# Patient Record
Sex: Female | Born: 1980 | Race: White | Hispanic: No | Marital: Single | Smoking: Never smoker
Health system: Southern US, Community
[De-identification: ages and names within clinical notes are randomized; demographics above are authoritative.]

---

## 2015-09-22 DIAGNOSIS — K219 Gastro-esophageal reflux disease without esophagitis: Secondary | ICD-10-CM

## 2015-09-22 HISTORY — DX: Gastro-esophageal reflux disease without esophagitis: K21.9

## 2018-07-31 ENCOUNTER — Emergency Department (HOSPITAL_COMMUNITY): Payer: Medicare Other

## 2018-07-31 ENCOUNTER — Emergency Department (HOSPITAL_COMMUNITY)
Admission: EM | Admit: 2018-07-31 | Discharge: 2018-07-31 | Disposition: A | Discharge status: Home / Self Care | Payer: Medicare Other | Attending: Emergency Medicine | Admitting: Emergency Medicine

## 2018-07-31 ENCOUNTER — Other Ambulatory Visit: Payer: Self-pay

## 2018-07-31 DIAGNOSIS — R0789 Other chest pain: Secondary | ICD-10-CM | POA: Insufficient documentation

## 2018-07-31 DIAGNOSIS — R1011 Right upper quadrant pain: Secondary | ICD-10-CM | POA: Insufficient documentation

## 2018-07-31 LAB — CBC WITH DIFFERENTIAL/PLATELET
ABS IMMATURE GRANULOCYTES: 0.05 10*3/uL (ref 0.00–0.07)
Basophils Absolute: 0 10*3/uL (ref 0.0–0.1)
Basophils Relative: 0 %
Eosinophils Absolute: 0.1 10*3/uL (ref 0.0–0.5)
Eosinophils Relative: 1 %
HCT: 39.2 % (ref 36.0–46.0)
HEMOGLOBIN: 12.5 g/dL (ref 12.0–15.0)
IMMATURE GRANULOCYTES: 0 %
LYMPHS PCT: 26 %
Lymphs Abs: 2.9 10*3/uL (ref 0.7–4.0)
MCH: 28 pg (ref 26.0–34.0)
MCHC: 31.9 g/dL (ref 30.0–36.0)
MCV: 87.7 fL (ref 80.0–100.0)
MONO ABS: 0.5 10*3/uL (ref 0.1–1.0)
Monocytes Relative: 4 %
NEUTROS ABS: 7.7 10*3/uL (ref 1.7–7.7)
NEUTROS PCT: 69 %
Platelets: 311 10*3/uL (ref 150–400)
RBC: 4.47 MIL/uL (ref 3.87–5.11)
RDW: 12.1 % (ref 11.5–15.5)
WBC: 11.3 10*3/uL — ABNORMAL HIGH (ref 4.0–10.5)
nRBC: 0 % (ref 0.0–0.2)

## 2018-07-31 LAB — I-STAT TROPONIN, ED
Troponin i, poc: 0.01 ng/mL (ref 0.00–0.08)
Troponin i, poc: 0.01 ng/mL (ref 0.00–0.08)

## 2018-07-31 LAB — BASIC METABOLIC PANEL
Anion gap: 11 (ref 5–15)
BUN: 6 mg/dL (ref 6–20)
CHLORIDE: 99 mmol/L (ref 98–111)
CO2: 24 mmol/L (ref 22–32)
Calcium: 8.9 mg/dL (ref 8.9–10.3)
Creatinine, Ser: 0.73 mg/dL (ref 0.44–1.00)
GFR calc Af Amer: >60 mL/min (ref >60)
GFR calc non Af Amer: >60 mL/min (ref >60)
GLUCOSE: 107 mg/dL — AB (ref 70–99)
POTASSIUM: 3.2 mmol/L — AB (ref 3.5–5.1)
Sodium: 134 mmol/L — ABNORMAL LOW (ref 135–145)

## 2018-07-31 LAB — LIPASE, BLOOD: LIPASE: 31 U/L (ref 11–51)

## 2018-07-31 LAB — HEPATIC FUNCTION PANEL
ALK PHOS: 72 U/L (ref 38–126)
ALT: 18 U/L (ref 0–44)
AST: 19 U/L (ref 15–41)
Albumin: 3.6 g/dL (ref 3.5–5.0)
BILIRUBIN DIRECT: 0.2 mg/dL (ref 0.0–0.2)
Indirect Bilirubin: 0.6 mg/dL (ref 0.3–0.9)
TOTAL PROTEIN: 7.1 g/dL (ref 6.5–8.1)
Total Bilirubin: 0.8 mg/dL (ref 0.3–1.2)

## 2018-07-31 LAB — I-STAT BETA HCG BLOOD, ED (MC, WL, AP ONLY)

## 2018-07-31 LAB — D-DIMER, QUANTITATIVE (NOT AT ARMC)

## 2018-07-31 MED ORDER — MORPHINE SULFATE (PF) 4 MG/ML IV SOLN
4.0000 mg | Freq: Once | INTRAVENOUS | Status: AC
  Administered 2018-07-31: 4 mg via INTRAVENOUS
  Filled 2018-07-31: qty 1

## 2018-07-31 MED ORDER — SODIUM CHLORIDE 0.9 % IV BOLUS
1000.0000 mL | Freq: Once | INTRAVENOUS | Status: AC
  Administered 2018-07-31: 1000 mL via INTRAVENOUS

## 2018-07-31 MED ORDER — KETOROLAC TROMETHAMINE 30 MG/ML IJ SOLN
30.0000 mg | Freq: Once | INTRAMUSCULAR | Status: AC
  Administered 2018-07-31: 30 mg via INTRAVENOUS
  Filled 2018-07-31: qty 1

## 2018-07-31 MED ORDER — ONDANSETRON HCL 4 MG/2ML IJ SOLN
4.0000 mg | Freq: Once | INTRAMUSCULAR | Status: AC
  Administered 2018-07-31: 4 mg via INTRAVENOUS
  Filled 2018-07-31: qty 2

## 2018-07-31 NOTE — ED Notes (Signed)
Patient transported to Ultrasound 

## 2018-07-31 NOTE — ED Notes (Signed)
Lab states they will add on d-dimer to previous blood draw.

## 2018-07-31 NOTE — Discharge Instructions (Signed)
Stay hydrated.   Take tylenol for pain.   See wellness center for follow up   If you have no place to stay, see list of shelters  Return to ER if you have worse chest pain, trouble breathing, vomiting.

## 2018-07-31 NOTE — ED Notes (Signed)
Per main lab- able to add on hepatic function panel and lipase to previous bloodwork

## 2018-07-31 NOTE — ED Triage Notes (Signed)
Pt to ED via EMS c/o chest pain that started this morning. Pain is across both sides of chest with radiation to right arm. Pain is worse with palpation and inspiration. 324 ASA and 2 nitro given PTA with no improvement. No previous medical hx.

## 2018-07-31 NOTE — ED Provider Notes (Signed)
[facility] EMERGENCY DEPARTMENT Provider Note   CSN: [id number] Arrival date & time: 07/31/18  1029     History   Chief Complaint Chief Complaint  Patient presents with  . Chest Pain    HPI Felicia Sutton is a 37 y.o. female.  Patient is a 37 year old female with no significant past medical history who presents with chest pain.  She states she was walking to [employer] and developed pain across her chest.  She describes it as a tightness and heaviness.  She has some associated shortness of breath.  She had some nausea but no vomiting.  No fevers.  No cough or chest congestion.  No leg pain or swelling.  No diaphoresis.  No history of heart problems in the past.  No history of similar type pain in the past.  She was given aspirin and 2 nitroglycerin by EMS with no improvement in symptoms.     No past medical history on file.  There are no active problems to display for this patient.    The histories are not reviewed yet. Please review them in the "History" navigator section and refresh this SmartLink.   OB History   None      Home Medications    Prior to Admission medications   Not on File    Family History No family history on file.  Social History Social History   Tobacco Use  . Smoking status: Not on file  Substance Use Topics  . Alcohol use: Not on file  . Drug use: Not on file     Allergies   Patient has no known allergies.   Review of Systems Review of Systems  Constitutional: Positive for fatigue. Negative for chills, diaphoresis and fever.  HENT: Negative for congestion, rhinorrhea and sneezing.   Eyes: Negative.   Respiratory: Positive for chest tightness and shortness of breath. Negative for cough.   Cardiovascular: Negative for chest pain and leg swelling.  Gastrointestinal: Positive for nausea. Negative for abdominal pain, blood in stool, diarrhea and vomiting.  Genitourinary: Negative for difficulty urinating, flank  pain, frequency and hematuria.  Musculoskeletal: Negative for arthralgias and back pain.  Skin: Negative for rash.  Neurological: Negative for dizziness, speech difficulty, weakness, numbness and headaches.     Physical Exam Updated Vital Signs BP 114/81   Pulse 71   Temp 97.8 F (36.6 C) (Oral)   Resp 14   Ht 5\' 2"  (1.575 m)   Wt 99.8 kg   LMP 07/22/2018 (Exact Date)   SpO2 100%   BMI 40.24 kg/m   Physical Exam  Constitutional: She is oriented to person, place, and time. She appears well-developed and well-nourished.  HENT:  Head: Normocephalic and atraumatic.  Eyes: Pupils are equal, round, and reactive to light.  Neck: Normal range of motion. Neck supple.  Cardiovascular: Normal rate, regular rhythm and normal heart sounds.  Pulmonary/Chest: Effort normal and breath sounds normal. No respiratory distress. She has no wheezes. She has no rales. She exhibits tenderness.  Abdominal: Soft. Bowel sounds are normal. There is no tenderness. There is no rebound and no guarding.  Musculoskeletal: Normal range of motion. She exhibits no edema.  No edema or calf tenderness  Lymphadenopathy:    She has no cervical adenopathy.  Neurological: She is alert and oriented to person, place, and time.  Skin: Skin is warm and dry. No rash noted.  Psychiatric: She has a normal mood and affect.     ED Treatments / Results  Labs (all labs ordered are listed, but only abnormal results are displayed) Labs Reviewed  BASIC METABOLIC PANEL - Abnormal; Notable for the following components:      Result Value   Sodium 134 (*)    Potassium 3.2 (*)    Glucose, Bld 107 (*)    All other components within normal limits  CBC WITH DIFFERENTIAL/PLATELET - Abnormal; Notable for the following components:   WBC 11.3 (*)    All other components within normal limits  D-DIMER, QUANTITATIVE (NOT AT [facility])  HEPATIC FUNCTION PANEL  LIPASE, BLOOD  I-STAT TROPONIN, ED  I-STAT BETA HCG BLOOD, ED ([facility], [facility], [facility]  ONLY)  I-STAT TROPONIN, ED    EKG EKG Interpretation  Date/Time:  Sunday July 31 2018 11:52:12 EST Ventricular Rate:  85 PR Interval:    QRS Duration: 70 QT Interval:  368 QTC Calculation: 438 R Axis:   40 Text Interpretation:  Sinus rhythm since last tracing no significant change Confirmed by Rolan Bucco [phone]) on 07/31/2018 2:01:17 PM   Radiology Dg Chest 2 View  Result Date: 07/31/2018 CLINICAL DATA:  Chest pain EXAM: CHEST - 2 VIEW COMPARISON:  None. FINDINGS: Monitoring leads overlie the patient. Normal cardiac and mediastinal contours. No consolidative pulmonary opacities. No pleural effusion or pneumothorax. IMPRESSION: No acute cardiopulmonary process. Electronically Signed   By: Annia Belt M.D.   On: 07/31/2018 11:46    Procedures Procedures (including critical care time)  Medications Ordered in ED Medications  morphine 4 MG/ML injection 4 mg (4 mg Intravenous Given 07/31/18 1115)  ketorolac (TORADOL) 30 MG/ML injection 30 mg (30 mg Intravenous Given 07/31/18 1414)     Initial Impression / Assessment and Plan / ED Course  I have reviewed the triage vital signs and the nursing notes.  Pertinent labs & imaging results that were available during my care of the patient were reviewed by me and considered in my medical decision making (see chart for details).     Pt is a 37yo who presents with chest pain while walking to [employer].  It is reproducible on palpation.  Her EKG does not show any ischemic changes.  She has had 2- troponins.  On reexam she had some vomiting.  Now she seems to have some pain in her right upper quadrant.  Further blood work and ultrasound are pending.  Patient care to be taken over by Dr. Silverio Sutton pending these results.  Final Clinical Impressions(s) / ED Diagnoses   Final diagnoses:  RUQ pain  Atypical chest pain    ED Discharge Orders    None       Rolan Bucco, MD 07/31/18 1553

## 2018-07-31 NOTE — ED Provider Notes (Signed)
  Physical Exam  BP 114/81   Pulse 71   Temp 97.8 F (36.6 C) (Oral)   Resp 14   Ht 5\' 2"  (1.575 m)   Wt 99.8 kg   LMP 07/22/2018 (Exact Date)   SpO2 100%   BMI 40.24 kg/m   Physical Exam  ED Course/Procedures     Procedures  MDM  Care assumed at 4 pm. Patient here with chest pain. Trop x 2 neg, D-dimer normal. Upon discharge, developed vomiting and RUQ pain. Sign out pending RUQ [place] and LFTs.   6:24 PM LFTs nl, RUQ nl. No vomiting after zofran and IVF. Stable for discharge. Patient states that she has nobody at home. Told her that she can call a ride and gave a list of shelters.         Charlynne Pander, MD 07/31/18 [phone]

## 2018-07-31 NOTE — ED Notes (Signed)
Patient transported to X-ray 

## 2018-08-27 ENCOUNTER — Emergency Department (HOSPITAL_COMMUNITY)
Admission: EM | Admit: 2018-08-27 | Discharge: 2018-08-27 | Disposition: A | Discharge status: Home / Self Care | Payer: Medicare Other | Attending: Emergency Medicine | Admitting: Emergency Medicine

## 2018-08-27 ENCOUNTER — Emergency Department (HOSPITAL_COMMUNITY): Payer: Medicare Other

## 2018-08-27 ENCOUNTER — Other Ambulatory Visit: Payer: Self-pay

## 2018-08-27 DIAGNOSIS — N10 Acute tubulo-interstitial nephritis: Secondary | ICD-10-CM | POA: Diagnosis not present

## 2018-08-27 DIAGNOSIS — R81 Glycosuria: Secondary | ICD-10-CM

## 2018-08-27 DIAGNOSIS — N12 Tubulo-interstitial nephritis, not specified as acute or chronic: Secondary | ICD-10-CM

## 2018-08-27 DIAGNOSIS — R109 Unspecified abdominal pain: Secondary | ICD-10-CM | POA: Diagnosis present

## 2018-08-27 LAB — CBC WITH DIFFERENTIAL/PLATELET
ABS IMMATURE GRANULOCYTES: 0.05 10*3/uL (ref 0.00–0.07)
Basophils Absolute: 0 10*3/uL (ref 0.0–0.1)
Basophils Relative: 0 %
EOS ABS: 0.1 10*3/uL (ref 0.0–0.5)
EOS PCT: 1 %
HCT: 39.7 % (ref 36.0–46.0)
HEMOGLOBIN: 12.1 g/dL (ref 12.0–15.0)
Immature Granulocytes: 1 %
LYMPHS ABS: 2.5 10*3/uL (ref 0.7–4.0)
LYMPHS PCT: 25 %
MCH: 27.3 pg (ref 26.0–34.0)
MCHC: 30.5 g/dL (ref 30.0–36.0)
MCV: 89.6 fL (ref 80.0–100.0)
MONO ABS: 0.7 10*3/uL (ref 0.1–1.0)
Monocytes Relative: 7 %
Neutro Abs: 6.9 10*3/uL (ref 1.7–7.7)
Neutrophils Relative %: 66 %
Platelets: 325 10*3/uL (ref 150–400)
RBC: 4.43 MIL/uL (ref 3.87–5.11)
RDW: 13.5 % (ref 11.5–15.5)
WBC: 10.2 10*3/uL (ref 4.0–10.5)
nRBC: 0 % (ref 0.0–0.2)

## 2018-08-27 LAB — WET PREP, GENITAL
Clue Cells Wet Prep HPF POC: NONE SEEN
SPERM: NONE SEEN
Trich, Wet Prep: NONE SEEN
Yeast Wet Prep HPF POC: NONE SEEN

## 2018-08-27 LAB — COMPREHENSIVE METABOLIC PANEL
ALK PHOS: 81 U/L (ref 38–126)
ALT: 17 U/L (ref 0–44)
AST: 14 U/L — ABNORMAL LOW (ref 15–41)
Albumin: 3.6 g/dL (ref 3.5–5.0)
Anion gap: 12 (ref 5–15)
BUN: 5 mg/dL — ABNORMAL LOW (ref 6–20)
CALCIUM: 8.8 mg/dL — AB (ref 8.9–10.3)
CO2: 24 mmol/L (ref 22–32)
CREATININE: 0.61 mg/dL (ref 0.44–1.00)
Chloride: 102 mmol/L (ref 98–111)
GFR calc non Af Amer: >60 mL/min (ref >60)
Glucose, Bld: 100 mg/dL — ABNORMAL HIGH (ref 70–99)
Potassium: 3.5 mmol/L (ref 3.5–5.1)
SODIUM: 138 mmol/L (ref 135–145)
Total Bilirubin: 0.8 mg/dL (ref 0.3–1.2)
Total Protein: 7.3 g/dL (ref 6.5–8.1)

## 2018-08-27 LAB — URINALYSIS, ROUTINE W REFLEX MICROSCOPIC
BILIRUBIN URINE: NEGATIVE
Glucose, UA: >=500 mg/dL — AB
KETONES UR: NEGATIVE mg/dL
Leukocytes, UA: NEGATIVE
NITRITE: NEGATIVE
Protein, ur: NEGATIVE mg/dL
RBC / HPF: >50 RBC/hpf — ABNORMAL HIGH (ref 0–5)
Specific Gravity, Urine: 1.002 — ABNORMAL LOW (ref 1.005–1.030)
WBC, UA: >50 WBC/hpf — ABNORMAL HIGH (ref 0–5)
pH: 5 (ref 5.0–8.0)

## 2018-08-27 LAB — I-STAT BETA HCG BLOOD, ED (MC, WL, AP ONLY)

## 2018-08-27 MED ORDER — SODIUM CHLORIDE 0.9 % IV SOLN
1.0000 g | Freq: Once | INTRAVENOUS | Status: AC
  Administered 2018-08-27: 1 g via INTRAVENOUS
  Filled 2018-08-27: qty 10

## 2018-08-27 MED ORDER — CEPHALEXIN 500 MG PO CAPS: 500.0000 mg | ORAL_CAPSULE | Freq: Four times a day (QID) | ORAL | 0 refills | Status: AC

## 2018-08-27 MED ORDER — ONDANSETRON HCL 4 MG/2ML IJ SOLN
4.0000 mg | Freq: Once | INTRAMUSCULAR | Status: AC
  Administered 2018-08-27: 4 mg via INTRAVENOUS
  Filled 2018-08-27: qty 2

## 2018-08-27 MED ORDER — SODIUM CHLORIDE 0.9 % IV BOLUS
1000.0000 mL | Freq: Once | INTRAVENOUS | Status: AC
  Administered 2018-08-27: 1000 mL via INTRAVENOUS

## 2018-08-27 MED ORDER — ONDANSETRON HCL 4 MG PO TABS: 4.0000 mg | ORAL_TABLET | Freq: Three times a day (TID) | ORAL | 0 refills | Status: DC | PRN

## 2018-08-27 MED ORDER — MORPHINE SULFATE (PF) 4 MG/ML IV SOLN
4.0000 mg | Freq: Once | INTRAVENOUS | Status: AC
  Administered 2018-08-27: 4 mg via INTRAVENOUS
  Filled 2018-08-27: qty 1

## 2018-08-27 NOTE — ED Provider Notes (Signed)
[facility] EMERGENCY DEPARTMENT Provider Note   CSN: [id number] Arrival date & time: 08/27/18  1135     History   Chief Complaint Chief Complaint  Patient presents with  . Flank Pain    HPI Felicia MansJennifer Sutton is a 37 y.o. female.  HPI  Patient is a 37 year old female with no significant past medical history presenting for right flank pain.  Patient reports that her symptoms began suddenly this morning.  She describes the pain as sharp, constant, and nonradiating.  She denies any lower abdominal pain, dysuria, urgency, frequency, vaginal bleeding, vaginal discharge.  She does report she had one episode of nausea with vomiting earlier today to the pain.  Last bowel movement this morning, and normal without melena or hematochezia.  No diarrhea.  Patient denies any fever or chills.  LMP 3 weeks ago.   No past medical history on file.  There are no active problems to display for this patient.   OB History   None      Home Medications    Prior to Admission medications   Not on File    Family History No family history on file.  Social History Social History   Tobacco Use  . Smoking status: Not on file  Substance Use Topics  . Alcohol use: Not on file  . Drug use: Not on file     Allergies   Patient has no known allergies.   Review of Systems Review of Systems  Constitutional: Negative for chills and fever.  Gastrointestinal: Positive for nausea and vomiting. Negative for abdominal pain.  Genitourinary: Positive for flank pain. Negative for dysuria, frequency, urgency, vaginal bleeding, vaginal discharge and vaginal pain.  All other systems reviewed and are negative.    Physical Exam Updated Vital Signs BP (!) 113/92   Pulse 71   Temp 98.3 F (36.8 C) (Oral)   Ht 5\' 2"  (1.575 m)   Wt 99.3 kg   SpO2 100%   BMI 40.06 kg/m   Physical Exam  Constitutional: She appears well-developed and well-nourished. No distress.  HENT:  Head:  Normocephalic and atraumatic.  Mouth/Throat: Oropharynx is clear and moist.  Eyes: Pupils are equal, round, and reactive to light. Conjunctivae and EOM are normal.  Neck: Normal range of motion. Neck supple.  Cardiovascular: Normal rate, regular rhythm, S1 normal and S2 normal.  No murmur heard. Pulmonary/Chest: Effort normal and breath sounds normal. She has no wheezes. She has no rales.  Abdominal: Soft. She exhibits no distension. There is no tenderness. There is no guarding.  +Right CVA TTP.  No RLQ/McBurney's point TTP.   Genitourinary:  Genitourinary Comments: Pelvic examination performed with RN chaperone present.  No lymphadenopathy or lesions of the perineum.  Vaginal tissue pink and rugated.  Cervix erythematous around os.  Small amount of yellow discharge surrounding os.  On bimanual exam, patient exhibits no cervical motion tenderness, uterine fundus tenderness, nor adnexal tenderness.  Musculoskeletal: Normal range of motion. She exhibits no edema or deformity.  Neurological: She is alert.  Cranial nerves grossly intact. Patient moves extremities symmetrically and with good coordination.  Skin: Skin is warm and dry. No rash noted. No erythema.  Psychiatric: She has a normal mood and affect. Her behavior is normal. Judgment and thought content normal.  Nursing note and vitals reviewed.    ED Treatments / Results  Labs (all labs ordered are listed, but only abnormal results are displayed) Labs Reviewed  WET PREP, GENITAL - Abnormal; Notable for  the following components:      Result Value   WBC, Wet Prep HPF POC MANY (*)    All other components within normal limits  COMPREHENSIVE METABOLIC PANEL - Abnormal; Notable for the following components:   Glucose, Bld 100 (*)    BUN 5 (*)    Calcium 8.8 (*)    AST 14 (*)    All other components within normal limits  URINALYSIS, ROUTINE W REFLEX MICROSCOPIC - Abnormal; Notable for the following components:   Color, Urine  COLORLESS (*)    Specific Gravity, Urine 1.002 (*)    Glucose, UA >=500 (*)    Hgb urine dipstick SMALL (*)    RBC / HPF >50 (*)    WBC, UA >50 (*)    Bacteria, UA MANY (*)    All other components within normal limits  URINE CULTURE  CBC WITH DIFFERENTIAL/PLATELET  RPR  HIV ANTIBODY (ROUTINE TESTING W REFLEX)  I-STAT BETA HCG BLOOD, ED ([facility], [facility], [facility] ONLY)  GC/CHLAMYDIA PROBE AMP ([place]) NOT AT [facility]    EKG None  Radiology Ct Renal Stone Study  Result Date: 08/27/2018 CLINICAL DATA:  Right flank pain that began this morning EXAM: CT ABDOMEN AND PELVIS WITHOUT CONTRAST TECHNIQUE: Multidetector CT imaging of the abdomen and pelvis was performed following the standard protocol without IV contrast. COMPARISON:  None. FINDINGS: Lower chest: No acute abnormality. Hepatobiliary: No focal liver abnormality is seen. No gallstones, gallbladder wall thickening, or biliary dilatation. Pancreas: Unremarkable. No pancreatic ductal dilatation or surrounding inflammatory changes. Spleen: Normal in size without focal abnormality. Adrenals/Urinary Tract: Normal adrenal glands. Punctate 2 mm nonobstructing left renal calculus. Punctate nonobstructing left inferior pole renal calculus. No other urolithiasis. No obstructive uropathy. Normal bladder. Stomach/Bowel: Stomach is within normal limits. Diverticulosis without evidence of diverticulitis. No evidence of bowel wall thickening, distention, or inflammatory changes. Vascular/Lymphatic: No significant vascular findings are present. No enlarged abdominal or pelvic lymph nodes. Reproductive: Uterus and bilateral adnexa are unremarkable. Other: No abdominal wall hernia or abnormality. No abdominopelvic ascites. Musculoskeletal: No acute osseous abnormality. No aggressive osseous lesion. IMPRESSION: 1. Punctate 2 mm nonobstructing left renal calculus. No obstructive uropathy. Electronically Signed   By: Elige Ko   On: 08/27/2018 13:57     Procedures Procedures (including critical care time)  Medications Ordered in ED Medications  sodium chloride 0.9 % bolus 1,000 mL (1,000 mLs Intravenous New Bag/Given 08/27/18 1243)  morphine 4 MG/ML injection 4 mg (4 mg Intravenous Given 08/27/18 1301)     Initial Impression / Assessment and Plan / ED Course  I have reviewed the triage vital signs and the nursing notes.  Pertinent labs & imaging results that were available during my care of the patient were reviewed by me and considered in my medical decision making (see chart for details).  Clinical Course as of Aug 27 1548  Sat Aug 27, 2018  1320 Pt denies history of diabetes.   Glucose, UA(!): >=500 [AM]  1353 Reassessed.  Patient reporting some nausea.  Will give antiemetics.  Rocephin ordered for infected stone versus pyelonephritis.   [AM]  1542 Reassessed. In NAD.    [AM]  1549 Spoke with Dr. Allena Katz of [facility] radiology who states that subtle findings of pyelonephritis are often not visible on noncontrast CT scan.  He does not see any signs of obstruction on the CT scan.  Urinalysis and symptoms are clinically consistent with pyelo and will treat patient as such.   [AM]  Clinical Course User Index [AM] Elisha Ponder, PA-C    Patient nontoxic-appearing, afebrile, and in no acute distress.  Of diagnosis includes pyelonephritis, nephrolithiasis, PID, appendicitis, cholecystitis, cholelithiasis.  CT renal stone study demonstrated nonobstructive left renal stones, but no active nephrolithiasis.  No renal obstruction.  No other acute findings.  No adnexal mass.  Urinalysis significant for many WBCs, many RBCs, many bacteria.  No leukocyte esterase or nitrites.  Culture is pending.  Patient is glucosuria, no history of diabetes.  We discussed this.  Patient has normal blood glucose.  Resources provided to help establish care with primary care.  No leukocytosis.  Doubt PID, as patient had no tenderness on pelvic  examination.  Suspect the patient had right flank pain secondary to syndrome, patient would have more severe pelvic pain.  Patient did have some discharge with many WBCs, however is not complaining of increased discharge.  Do not feel that patient needs empiric treatment for gonorrhea and chlamydia. Feel that if patient has gonorrhea or chlamydia, she can be treated for cervicitis.  Clinical picture is not consistent with PID.   We will treat patient with Keflex.  Discussed with patient that should her culture come back resistant to Keflex, she will be called to change antibiotics.  Also discussed with patient that should she receive a positive result for any of her STI testing today, she will receive a call.  Recommend Tylenol for pain, Zofran for nausea.  Patient given resources to establish primary care in this community.  Return precautions given for any worsening pain, intractable nausea or vomiting, or fevers.  Patient is in understanding and agrees with the plan of care.  This is a supervised visit with Dr. Thayer Ohm Tegeler. Evaluation, management, and discharge planning discussed with this attending physician.  Final Clinical Impressions(s) / ED Diagnoses   Final diagnoses:  Pyelonephritis  Glucosuria    ED Discharge Orders         Ordered    cephALEXin (KEFLEX) 500 MG capsule  4 times daily     08/27/18 1556    ondansetron (ZOFRAN) 4 MG tablet  Every 8 hours PRN     08/27/18 1556           Elisha Ponder, PA-C 08/27/18 1603    Tegeler, Canary Brim, MD 08/27/18 1626

## 2018-08-27 NOTE — ED Triage Notes (Signed)
Pt arrived via POV d/t R.lower flank pain that started approx 2 days ago w/ N/V

## 2018-08-27 NOTE — Discharge Instructions (Signed)
Please see the information and instructions below regarding your visit.  Your diagnoses today include:  1. Pyelonephritis   2. Glucosuria    Your symptoms are caused by a urinary tract infection, which occurs when bacteria travel up into the bladder and then into the kidney. This is a very common condition. Often, bacteria is transmitted while wiping after using the restroom. It is reassuring that you do not have a fever today.  Tests performed today include: See side panel of your discharge paperwork for testing performed today. Vital signs are listed at the bottom of these instructions.  -Urine tests. Suggestive of infection.  The fluid in the vagina also has a lot of infection fighting cells.  You were tested for gonorrhea, chlamydia, HIV, and syphilis.  These test will be available in 2 to 3 days.  If anything is positive, you receive a call for further testing or treatment.  If anything is negative, you will not receive a call.  Medications prescribed:    Take any prescribed medications only as prescribed, and any over the counter medications only as directed on the packaging.  Please take all of your antibiotics until finished.   You may develop abdominal discomfort or nausea from the antibiotic. If this occurs, you may take it with food. Some patients also get diarrhea with antibiotics. You may help offset this with probiotics which you can buy or get in yogurt. Do not eat or take the probiotics until 2 hours after your antibiotic. Some women develop vaginal yeast infections after antibiotics. If you develop unusual vaginal discharge after being on this medication, please see your primary care provider.   Some people develop allergies to antibiotics. Symptoms of antibiotic allergy can be mild and include a flat rash and itching. They can also be more serious and include:  ?Hives - Hives are raised, red patches of skin that are usually very itchy.  ?Lip or tongue swelling  ?Trouble  swallowing or breathing  ?Blistering of the skin or mouth.  If you have any of these serious symptoms, please seek emergency medical care immediately.  You are prescribed Zofran.  He may take 4 mg every 8 hours as needed for nausea and vomiting.  Please use Tylenol and naproxen for pain.  You may take Tylenol, 650 mg every 6 hours as needed for pain.  Do not exceed 4 g in 1 day.  Home care instructions:  Please follow any educational materials contained in this packet.  Please stay hydrated by making sure that your urine is very light yellow in color.    Follow-up instructions: Please follow-up with your primary care provider in one week for further evaluation of your symptoms.   Return instructions:  Please return to the Emergency Department if you experience worsening symptoms. Please seek immediate care if you develop the following: Your symptoms are no better or worse in 3 days. There is severe back pain or lower abdominal pain.  You develop chills.  You have a fever.  There is nausea or vomiting.  There is continued burning or discomfort with urination.  You have any additional concerns.  Please return if you have any other emergent concerns.  Additional Information: To find a primary care or specialty doctor please call [phone] or [phone] to access "[facility] Find a Doctor Service."  You may also go on the [facility] website at [email or web address]  There are also multiple [facility] practices throughout the [place] that are frequently accepting new  patients. You may find a clinic that is close to your home and contact them.  [facility] - [address] [place] [phone]  [facility] in [place] (also locations in [place] and [place]) - [address] [place] [address] [phone]  [facility] - [address] [place]  [phone]   Your vital signs today were: BP 114/77    Pulse 73    Temp 98.3 F (36.8 C) (Oral)    Ht 5\' 2"  (1.575 m)    Wt 99.3 kg    SpO2 100%    BMI 40.06 kg/m  If your blood pressure (BP) was elevated on multiple readings during this visit above 130 for the top number or above 80 for the bottom number, please have this repeated by your primary care provider within one month. --------------  Thank you for allowing us to participate in your care today.

## 2018-08-27 NOTE — ED Notes (Signed)
Pt tolerated PO fluids well  

## 2018-08-28 LAB — HIV ANTIBODY (ROUTINE TESTING W REFLEX): HIV Screen 4th Generation wRfx: NONREACTIVE

## 2018-08-28 LAB — RPR: RPR Ser Ql: NONREACTIVE

## 2018-08-29 LAB — URINE CULTURE: Culture: >=100000 — AB

## 2018-08-29 LAB — GC/CHLAMYDIA PROBE AMP (~~LOC~~) NOT AT ARMC
Chlamydia: NEGATIVE
Neisseria Gonorrhea: NEGATIVE

## 2018-08-30 ENCOUNTER — Telehealth: Payer: Self-pay | Admitting: Emergency Medicine

## 2018-08-30 NOTE — Telephone Encounter (Signed)
Post ED Visit - Positive Culture Follow-up  Culture report reviewed by antimicrobial stewardship pharmacist:  []  Enzo BiNathan Batchelder, Pharm.D. []  Celedonio MiyamotoJeremy Frens, Pharm.D., BCPS AQ-ID []  Garvin FilaMike Maccia, Pharm.D., BCPS []  Georgina PillionElizabeth Martin, Pharm.D., BCPS []  [place] Pham, [place].D., BCPS, AAHIVP []  Estella HuskMichelle Turner, Pharm.D., BCPS, AAHIVP [x]  Lysle Pearlachel Rumbarger, PharmD, BCPS []  Phillips Climeshuy Dang, PharmD, BCPS []  Agapito GamesAlison Masters, PharmD, BCPS []  Verlan FriendsErin Deja, PharmD  Positive urine culture Treated with cephalexin, organism sensitive to the same and no further patient follow-up is required at this time.  Berle MullMiller, Brynleigh Sequeira 08/30/2018, 11:17 AM

## 2019-01-20 HISTORY — PX: CHOLECYSTECTOMY: SHX55

## 2019-12-15 ENCOUNTER — Other Ambulatory Visit: Payer: Self-pay

## 2019-12-15 ENCOUNTER — Encounter (HOSPITAL_COMMUNITY): Payer: Self-pay

## 2019-12-15 ENCOUNTER — Emergency Department (HOSPITAL_COMMUNITY)
Admission: EM | Admit: 2019-12-15 | Discharge: 2019-12-15 | Disposition: A | Discharge status: Home / Self Care | Payer: Medicare Other | Attending: Emergency Medicine | Admitting: Emergency Medicine

## 2019-12-15 DIAGNOSIS — R1031 Right lower quadrant pain: Secondary | ICD-10-CM | POA: Diagnosis not present

## 2019-12-15 DIAGNOSIS — G8929 Other chronic pain: Secondary | ICD-10-CM | POA: Insufficient documentation

## 2019-12-15 DIAGNOSIS — R112 Nausea with vomiting, unspecified: Secondary | ICD-10-CM | POA: Insufficient documentation

## 2019-12-15 DIAGNOSIS — R1011 Right upper quadrant pain: Secondary | ICD-10-CM | POA: Insufficient documentation

## 2019-12-15 LAB — CBC
HCT: 40.9 % (ref 36.0–46.0)
Hemoglobin: 13.1 g/dL (ref 12.0–15.0)
MCH: 29.7 pg (ref 26.0–34.0)
MCHC: 32 g/dL (ref 30.0–36.0)
MCV: 92.7 fL (ref 80.0–100.0)
Platelets: 301 10*3/uL (ref 150–400)
RBC: 4.41 MIL/uL (ref 3.87–5.11)
RDW: 12.6 % (ref 11.5–15.5)
WBC: 9.2 10*3/uL (ref 4.0–10.5)
nRBC: 0 % (ref 0.0–0.2)

## 2019-12-15 LAB — COMPREHENSIVE METABOLIC PANEL
ALT: 20 U/L (ref 0–44)
AST: 20 U/L (ref 15–41)
Albumin: 3.7 g/dL (ref 3.5–5.0)
Alkaline Phosphatase: 77 U/L (ref 38–126)
Anion gap: 11 (ref 5–15)
BUN: 9 mg/dL (ref 6–20)
CO2: 25 mmol/L (ref 22–32)
Calcium: 8.9 mg/dL (ref 8.9–10.3)
Chloride: 104 mmol/L (ref 98–111)
Creatinine, Ser: 0.6 mg/dL (ref 0.44–1.00)
GFR calc Af Amer: >60 mL/min (ref >60)
GFR calc non Af Amer: >60 mL/min (ref >60)
Glucose, Bld: 97 mg/dL (ref 70–99)
Potassium: 4 mmol/L (ref 3.5–5.1)
Sodium: 140 mmol/L (ref 135–145)
Total Bilirubin: 1.2 mg/dL (ref 0.3–1.2)
Total Protein: 7 g/dL (ref 6.5–8.1)

## 2019-12-15 LAB — URINALYSIS, ROUTINE W REFLEX MICROSCOPIC
Bilirubin Urine: NEGATIVE
Glucose, UA: NEGATIVE mg/dL
Hgb urine dipstick: NEGATIVE
Ketones, ur: NEGATIVE mg/dL
Nitrite: NEGATIVE
Protein, ur: NEGATIVE mg/dL
Specific Gravity, Urine: 1.017 (ref 1.005–1.030)
pH: 5 (ref 5.0–8.0)

## 2019-12-15 LAB — LIPASE, BLOOD: Lipase: 19 U/L (ref 11–51)

## 2019-12-15 LAB — I-STAT BETA HCG BLOOD, ED (MC, WL, AP ONLY): I-stat hCG, quantitative: <5 m[IU]/mL (ref <5)

## 2019-12-15 MED ORDER — ONDANSETRON HCL 4 MG/2ML IJ SOLN
4.0000 mg | Freq: Once | INTRAMUSCULAR | Status: AC
  Administered 2019-12-15: 4 mg via INTRAVENOUS
  Filled 2019-12-15: qty 2

## 2019-12-15 MED ORDER — IBUPROFEN 600 MG PO TABS: 600.0000 mg | ORAL_TABLET | Freq: Four times a day (QID) | ORAL | 0 refills | Status: DC | PRN

## 2019-12-15 MED ORDER — SODIUM CHLORIDE 0.9 % IV BOLUS
1000.0000 mL | Freq: Once | INTRAVENOUS | Status: AC
  Administered 2019-12-15: 1000 mL via INTRAVENOUS

## 2019-12-15 MED ORDER — ALUM & MAG HYDROXIDE-SIMETH 200-200-20 MG/5ML PO SUSP
30.0000 mL | Freq: Once | ORAL | Status: AC
  Administered 2019-12-15: 30 mL via ORAL
  Filled 2019-12-15: qty 30

## 2019-12-15 MED ORDER — KETOROLAC TROMETHAMINE 30 MG/ML IJ SOLN
30.0000 mg | Freq: Once | INTRAMUSCULAR | Status: AC
  Administered 2019-12-15: 30 mg via INTRAVENOUS
  Filled 2019-12-15: qty 1

## 2019-12-15 MED ORDER — LIDOCAINE VISCOUS HCL 2 % MT SOLN
15.0000 mL | Freq: Once | OROMUCOSAL | Status: AC
  Administered 2019-12-15: 15 mL via ORAL
  Filled 2019-12-15: qty 15

## 2019-12-15 MED ORDER — FAMOTIDINE 20 MG PO TABS
20.0000 mg | ORAL_TABLET | Freq: Once | ORAL | Status: AC
  Administered 2019-12-15: 20 mg via ORAL
  Filled 2019-12-15: qty 1

## 2019-12-15 MED ORDER — ONDANSETRON HCL 4 MG PO TABS: 4.0000 mg | ORAL_TABLET | Freq: Three times a day (TID) | ORAL | 0 refills | Status: DC | PRN

## 2019-12-15 NOTE — ED Notes (Signed)
Patient verbalizes understanding of discharge instructions. Opportunity for questioning and answers were provided. Pt discharged from ED. 

## 2019-12-15 NOTE — Discharge Instructions (Signed)
1. Medications: Alternate 600 mg of ibuprofen and [id number] mg of Tylenol every 3 hours as needed for pain. Do not exceed 4000 mg of Tylenol daily.  Take ibuprofen with food to avoid upset stomach.  Take Zofran as needed for nausea.  Let this medicine dissolve under your tongue and wait around 10-15 minutes before eating or drinking after taking this medication. 2. Treatment: rest, drink plenty of fluids, advance diet slowly.  Start with water and broth then advance to bland foods that will not upset your stomach such as crackers, mashed potatoes, and peanut butter. 3. Follow Up: Please followup with your primary doctor or gastroenterologist in 3 days for discussion of your diagnoses and further evaluation after today's visit; if you do not have a primary care doctor use the resource guide provided to find one; Please return to the ER for persistent vomiting, high fevers or worsening symptoms

## 2019-12-15 NOTE — ED Notes (Signed)
Pt give food and beverage for PO challenge per verbal or der from [place], [place]

## 2019-12-15 NOTE — ED Triage Notes (Signed)
Pr reports RLQ pain that radiates to her back for the past 2 days with n/v. Denies diarrhea.

## 2019-12-15 NOTE — ED Notes (Addendum)
IV appears to have infiltrated after giving zofran IV; IV removed; Discussed with Jonny Ruiz, Pharmacist and [place], [place]; no further action needed at this time; will continue to monitor

## 2019-12-15 NOTE — ED Provider Notes (Signed)
[facility] EMERGENCY DEPARTMENT Provider Note   CSN: [id number] Arrival date & time: 12/15/19  1122     History Chief Complaint  Patient presents with  . Abdominal Pain    Felicia Sutton is a 39 y.o. female presents for evaluation of acute onset, constant right-sided abdominal pains for 2 days.  The pain is sharp and stabbing, no aggravating or alleviating factors noted.  It radiates from the right side of the abdomen to the back.  She has had a couple of episodes of nonbloody nonbilious emesis daily between yesterday and today.  She denies diarrhea, constipation, melena, hematochezia, urinary symptoms, vaginal itching, bleeding, or discharge.  She has been taking Tylenol and applying heating pad and taking Zofran with little relief.  No fevers.  Reports this pain feels similar to pain she has experienced previously.  She has been seen by gastroenterology at [facility] but tells me she is unsure what is causing her abdominal pain.  Upon further review of her chart she appears to have multiple visits for similar complaints to multiple different outside hospitals in the [place] with multiple images which have never shown any acute processes.  The history is provided by the patient and medical records.       History reviewed. No pertinent past medical history.  There are no problems to display for this patient.   History reviewed. No pertinent surgical history.   OB History   No obstetric history on file.     No family history on file.  Social History   Tobacco Use  . Smoking status: Not on file  Substance Use Topics  . Alcohol use: Not on file  . Drug use: Not on file    Home Medications Prior to Admission medications   Medication Sig Start Date End Date Taking? Authorizing Provider  ibuprofen (ADVIL) 600 MG tablet Take 1 tablet (600 mg total) by mouth every 6 (six) hours as needed. 12/15/19   Nils Flack, Cydney Alvarenga A, PA-C    ondansetron (ZOFRAN) 4 MG tablet Take 1 tablet (4 mg total) by mouth every 8 (eight) hours as needed for nausea or vomiting. 12/15/19   Rodell Perna A, PA-C    Allergies    Patient has no known allergies.  Review of Systems   Review of Systems  Constitutional: Negative for chills and fever.  Respiratory: Negative for shortness of breath.   Cardiovascular: Negative for chest pain.  Gastrointestinal: Positive for abdominal pain, nausea and vomiting. Negative for constipation and diarrhea.  Genitourinary: Negative for dysuria, frequency, urgency, vaginal bleeding, vaginal discharge and vaginal pain.  All other systems reviewed and are negative.   Physical Exam Updated Vital Signs BP 123/81 (BP Location: Left Arm)   Pulse 81   Temp 98.4 F (36.9 C) (Oral)   Resp 18   Ht 5\' 2"  (1.575 m)   Wt 100.2 kg   LMP 11/20/2019   SpO2 100%   BMI 40.42 kg/m   Physical Exam Vitals and nursing note reviewed.  Constitutional:      General: She is not in acute distress.    Appearance: She is well-developed.     Comments: Resting comfortably sitting upright in chair  HENT:     Head: Normocephalic and atraumatic.  Eyes:     General:        Right eye: No discharge.        Left eye: No discharge.     Conjunctiva/sclera: Conjunctivae normal.  Neck:  Vascular: No JVD.     Trachea: No tracheal deviation.  Cardiovascular:     Rate and Rhythm: Normal rate and regular rhythm.     Heart sounds: Normal heart sounds.  Pulmonary:     Effort: Pulmonary effort is normal.     Breath sounds: Normal breath sounds.  Abdominal:     General: Abdomen is flat. Bowel sounds are normal. There is no distension.     Palpations: Abdomen is soft.     Tenderness: There is abdominal tenderness in the right upper quadrant and right lower quadrant. There is right CVA tenderness. There is no left CVA tenderness, guarding or rebound. Negative signs include Murphy's sign, Rovsing's sign, McBurney's sign and psoas  sign.  Skin:    General: Skin is warm and dry.     Findings: No erythema.  Neurological:     Mental Status: She is alert.  Psychiatric:        Behavior: Behavior normal.     ED Results / Procedures / Treatments   Labs (all labs ordered are listed, but only abnormal results are displayed) Labs Reviewed  URINALYSIS, ROUTINE W REFLEX MICROSCOPIC - Abnormal; Notable for the following components:      Result Value   APPearance HAZY (*)    Leukocytes,Ua MODERATE (*)    Bacteria, UA RARE (*)    All other components within normal limits  LIPASE, BLOOD  COMPREHENSIVE METABOLIC PANEL  CBC  I-STAT BETA HCG BLOOD, ED ([facility], [facility], [facility] ONLY)    EKG None  Radiology No results found.  Procedures Procedures (including critical care time)  Medications Ordered in ED Medications  ketorolac (TORADOL) 30 MG/ML injection 30 mg (30 mg Intravenous Given 12/15/19 1342)  sodium chloride 0.9 % bolus 1,000 mL (0 mLs Intravenous Stopped 12/15/19 1442)  ondansetron (ZOFRAN) injection 4 mg (4 mg Intravenous Given 12/15/19 1325)  alum & mag hydroxide-simeth (MAALOX/MYLANTA) 200-200-20 MG/5ML suspension 30 mL (30 mLs Oral Given 12/15/19 1417)    And  lidocaine (XYLOCAINE) 2 % viscous mouth solution 15 mL (15 mLs Oral Given 12/15/19 1417)  famotidine (PEPCID) tablet 20 mg (20 mg Oral Given 12/15/19 1417)    ED Course  I have reviewed the triage vital signs and the nursing notes.  Pertinent labs & imaging results that were available during my care of the patient were reviewed by me and considered in my medical decision making (see chart for details).    MDM Rules/Calculators/A&P                      Patient presenting for evaluation of right-sided abdominal pains with associated nausea and vomiting.  She is afebrile, vital signs are stable.  She is nontoxic in appearance.  Abdomen is soft with no rebound or guarding on examination.  Upon chart review, patient has had multiple ED visits at various emergency  department and outside hospitals with negative work-up.  Has had multiple negative imaging studies over the years.  She has no urinary symptoms or GU complaints at all to suggest pyelonephritis, nephrolithiasis, UTI, PID, TOA, ovarian torsion or ectopic pregnancy.  Her most recent CT scan showed a 2 mm nonobstructing left renal calculus but no right-sided renal calculi.  Lab work reviewed and interpreted by myself shows no leukocytosis, no anemia, no metabolic derangements, no renal insufficiency.  Her UA does not suggest UTI or nephrolithiasis, and she has no urinary symptoms.  She was given IV fluids, Toradol, GI cocktail and Pepcid in  the ED and on reevaluation is resting comfortably, reports she is feeling better.  Serial abdominal examinations remain benign.  She is expressing interest in seeing GI within our system so I will give her referral for follow-up.  Discussed symptomatic management, advancing diet slowly.  At this time I doubt acute surgical abdominal pathology given patient's history and reassuring work-up today.  Discussed strict ED return precautions. Patient verbalized understanding of and agreement with plan and is safe for discharge home at this time.    Final Clinical Impression(s) / ED Diagnoses Final diagnoses:  Non-intractable vomiting with nausea, unspecified vomiting type    Rx / DC Orders ED Discharge Orders         Ordered    ondansetron (ZOFRAN) 4 MG tablet  Every 8 hours PRN     12/15/19 1521    ibuprofen (ADVIL) 600 MG tablet  Every 6 hours PRN     12/15/19 1521           Jeanie Sewer, PA-C 12/15/19 1535    Tilden Fossa, MD 12/15/19 1553

## 2019-12-21 DIAGNOSIS — F329 Major depressive disorder, single episode, unspecified: Secondary | ICD-10-CM

## 2019-12-21 HISTORY — DX: Major depressive disorder, single episode, unspecified: F32.9

## 2019-12-29 ENCOUNTER — Other Ambulatory Visit: Payer: Self-pay

## 2019-12-29 ENCOUNTER — Emergency Department (HOSPITAL_COMMUNITY)
Admission: EM | Admit: 2019-12-29 | Discharge: 2019-12-29 | Disposition: A | Discharge status: Home / Self Care | Payer: Medicare Other | Attending: Emergency Medicine | Admitting: Emergency Medicine

## 2019-12-29 ENCOUNTER — Emergency Department (HOSPITAL_COMMUNITY): Payer: Medicare Other

## 2019-12-29 ENCOUNTER — Encounter (HOSPITAL_COMMUNITY): Payer: Self-pay | Admitting: Emergency Medicine

## 2019-12-29 DIAGNOSIS — R0789 Other chest pain: Secondary | ICD-10-CM | POA: Diagnosis present

## 2019-12-29 DIAGNOSIS — R072 Precordial pain: Secondary | ICD-10-CM | POA: Diagnosis not present

## 2019-12-29 LAB — COMPREHENSIVE METABOLIC PANEL
ALT: 30 U/L (ref 0–44)
AST: 21 U/L (ref 15–41)
Albumin: 3.9 g/dL (ref 3.5–5.0)
Alkaline Phosphatase: 85 U/L (ref 38–126)
Anion gap: 10 (ref 5–15)
BUN: 8 mg/dL (ref 6–20)
CO2: 25 mmol/L (ref 22–32)
Calcium: 8.9 mg/dL (ref 8.9–10.3)
Chloride: 102 mmol/L (ref 98–111)
Creatinine, Ser: 0.54 mg/dL (ref 0.44–1.00)
GFR calc Af Amer: >60 mL/min (ref >60)
GFR calc non Af Amer: >60 mL/min (ref >60)
Glucose, Bld: 104 mg/dL — ABNORMAL HIGH (ref 70–99)
Potassium: 3.4 mmol/L — ABNORMAL LOW (ref 3.5–5.1)
Sodium: 137 mmol/L (ref 135–145)
Total Bilirubin: 0.4 mg/dL (ref 0.3–1.2)
Total Protein: 7.5 g/dL (ref 6.5–8.1)

## 2019-12-29 LAB — CBC
HCT: 38.5 % (ref 36.0–46.0)
Hemoglobin: 12.7 g/dL (ref 12.0–15.0)
MCH: 30.3 pg (ref 26.0–34.0)
MCHC: 33 g/dL (ref 30.0–36.0)
MCV: 91.9 fL (ref 80.0–100.0)
Platelets: 291 10*3/uL (ref 150–400)
RBC: 4.19 MIL/uL (ref 3.87–5.11)
RDW: 12.3 % (ref 11.5–15.5)
WBC: 9.5 10*3/uL (ref 4.0–10.5)
nRBC: 0 % (ref 0.0–0.2)

## 2019-12-29 LAB — TROPONIN I (HIGH SENSITIVITY): Troponin I (High Sensitivity): <2 ng/L (ref <18)

## 2019-12-29 MED ORDER — POTASSIUM CHLORIDE CRYS ER 20 MEQ PO TBCR
40.0000 meq | EXTENDED_RELEASE_TABLET | Freq: Once | ORAL | Status: AC
  Administered 2019-12-29: 23:00:00 40 meq via ORAL
  Filled 2019-12-29: qty 2

## 2019-12-29 MED ORDER — ALUM & MAG HYDROXIDE-SIMETH 200-200-20 MG/5ML PO SUSP
30.0000 mL | Freq: Once | ORAL | Status: AC
  Administered 2019-12-29: 30 mL via ORAL
  Filled 2019-12-29: qty 30

## 2019-12-29 MED ORDER — FAMOTIDINE 20 MG PO TABS
20.0000 mg | ORAL_TABLET | Freq: Once | ORAL | Status: AC
  Administered 2019-12-29: 21:00:00 20 mg via ORAL
  Filled 2019-12-29: qty 1

## 2019-12-29 MED ORDER — ACETAMINOPHEN 500 MG PO TABS
1000.0000 mg | ORAL_TABLET | Freq: Once | ORAL | Status: AC
  Administered 2019-12-29: 1000 mg via ORAL
  Filled 2019-12-29: qty 2

## 2019-12-29 MED ORDER — METHOCARBAMOL 500 MG PO TABS
750.0000 mg | ORAL_TABLET | Freq: Once | ORAL | Status: AC
  Administered 2019-12-29: 750 mg via ORAL
  Filled 2019-12-29: qty 2

## 2019-12-29 NOTE — ED Provider Notes (Signed)
[facility] EMERGENCY DEPARTMENT Provider Note   CSN: [id number] Arrival date & time:        History Chief Complaint  Patient presents with  . Chest Pain    Felicia Sutton is a 39 y.o. female.  Patient c/o mid chest pain for the past 2 days. Symptoms acute onset at rest, midline/mid sternal area, non radiating, constant, dull, moderate, without specific exacerbating or alleviating factors. Patient notes similar cp in past, also at rest. Denies hx cad or fam hx cad. Denies hx gerd or current heartburn. No cough or uri symptoms. No fever or chills. Denies chest wall injury or strain. No pleuritic pain. No leg pain or swelling. No recent surgery, immobility, trauma or travel. No hx dvt or pe. No recent febrile/viral illness. Pt also notes recent nausea and vomiting. Emesis clear, not bloody or bilious. No abd distension or pain. Having normal bms.   The history is provided by the patient.  Chest Pain Associated symptoms: nausea and vomiting   Associated symptoms: no abdominal pain, no back pain, no cough, no fever, no headache and no shortness of breath        History reviewed. No pertinent past medical history.  There are no problems to display for this patient.   History reviewed. No pertinent surgical history.   OB History   No obstetric history on file.     History reviewed. No pertinent family history.  Social History   Tobacco Use  . Smoking status: Never Smoker  . Smokeless tobacco: Never Used  Substance Use Topics  . Alcohol use: Never  . Drug use: Never    Home Medications Prior to Admission medications   Medication Sig Start Date End Date Taking? Authorizing Provider  ibuprofen (ADVIL) 600 MG tablet Take 1 tablet (600 mg total) by mouth every 6 (six) hours as needed. 12/15/19   Nils Flack, Mina A, PA-C  ondansetron (ZOFRAN) 4 MG tablet Take 1 tablet (4 mg total) by mouth every 8 (eight) hours as needed for nausea or vomiting. 12/15/19   Rodell Perna A, PA-C     Allergies    Patient has no known allergies.  Review of Systems   Review of Systems  Constitutional: Negative for fever.  HENT: Negative for sore throat.   Eyes: Negative for redness.  Respiratory: Negative for cough and shortness of breath.   Cardiovascular: Positive for chest pain. Negative for leg swelling.  Gastrointestinal: Positive for nausea and vomiting. Negative for abdominal pain.  Genitourinary: Negative for dysuria and flank pain.  Musculoskeletal: Negative for back pain and neck pain.  Skin: Negative for rash.  Neurological: Negative for headaches.  Hematological: Does not bruise/bleed easily.  Psychiatric/Behavioral: Negative for confusion.    Physical Exam Updated Vital Signs BP 115/83 (BP Location: Right Arm)   Pulse 95   Temp 98.8 F (37.1 C) (Oral)   Resp 19   Ht 1.575 m (5\' 2" )   Wt 100.2 kg   LMP 11/20/2019   SpO2 99%   BMI 40.42 kg/m   Physical Exam Vitals and nursing note reviewed.  Constitutional:      Appearance: Normal appearance. She is well-developed.  HENT:     Head: Atraumatic.     Nose: Nose normal.     Mouth/Throat:     Mouth: Mucous membranes are moist.  Eyes:     General: No scleral icterus.    Conjunctiva/sclera: Conjunctivae normal.  Neck:     Trachea: No tracheal deviation.  Cardiovascular:  Rate and Rhythm: Normal rate and regular rhythm.     Pulses: Normal pulses.     Heart sounds: Normal heart sounds. No murmur. No friction rub. No gallop.   Pulmonary:     Effort: Pulmonary effort is normal. No respiratory distress.     Breath sounds: Normal breath sounds.  Abdominal:     General: Bowel sounds are normal. There is no distension.     Palpations: Abdomen is soft.     Tenderness: There is no abdominal tenderness. There is no guarding.  Genitourinary:    Comments: No cva tenderness.  Musculoskeletal:        General: No swelling or tenderness.     Cervical back: Normal range of motion and neck supple. No  rigidity. No muscular tenderness.     Right lower leg: No edema.     Left lower leg: No edema.  Skin:    General: Skin is warm and dry.     Findings: No rash.  Neurological:     Mental Status: She is alert.     Comments: Alert, speech normal.   Psychiatric:     Comments: Mildly anxious.      ED Results / Procedures / Treatments   Labs (all labs ordered are listed, but only abnormal results are displayed) Results for orders placed or performed during the hospital encounter of 12/29/19  CBC  Result Value Ref Range   WBC 9.5 4.0 - 10.5 K/uL   RBC 4.19 3.87 - 5.11 MIL/uL   Hemoglobin 12.7 12.0 - 15.0 g/dL   HCT 83.4 19.6 - 22.2 %   MCV 91.9 80.0 - 100.0 fL   MCH 30.3 26.0 - 34.0 pg   MCHC 33.0 30.0 - 36.0 g/dL   RDW 97.9 89.2 - 11.9 %   Platelets 291 150 - 400 K/uL   nRBC 0.0 0.0 - 0.2 %  Comprehensive metabolic panel  Result Value Ref Range   Sodium 137 135 - 145 mmol/L   Potassium 3.4 (L) 3.5 - 5.1 mmol/L   Chloride 102 98 - 111 mmol/L   CO2 25 22 - 32 mmol/L   Glucose, Bld 104 (H) 70 - 99 mg/dL   BUN 8 6 - 20 mg/dL   Creatinine, Ser 4.17 0.44 - 1.00 mg/dL   Calcium 8.9 8.9 - 40.8 mg/dL   Total Protein 7.5 6.5 - 8.1 g/dL   Albumin 3.9 3.5 - 5.0 g/dL   AST 21 15 - 41 U/L   ALT 30 0 - 44 U/L   Alkaline Phosphatase 85 38 - 126 U/L   Total Bilirubin 0.4 0.3 - 1.2 mg/dL   GFR calc non Af Amer >60 >60 mL/min   GFR calc Af Amer >60 >60 mL/min   Anion gap 10 5 - 15  Troponin I (High Sensitivity)  Result Value Ref Range   Troponin I (High Sensitivity) <2 <18 ng/L   DG Chest Port 1 View  Result Date: 12/29/2019 CLINICAL DATA:  Chest pain EXAM: PORTABLE CHEST 1 VIEW COMPARISON:  07/31/2018 FINDINGS: Heart and mediastinal contours are within normal limits. No focal opacities or effusions. No acute bony abnormality. IMPRESSION: No active disease. Electronically Signed   By: Charlett Nose M.D.   On: 12/29/2019 21:36    EKG EKG Interpretation  Date/Time:  Friday December 29 2019 20:58:40 EDT Ventricular Rate:  93 PR Interval:    QRS Duration: 72 QT Interval:  348 QTC Calculation: 433 R Axis:   24 Text Interpretation: Sinus  rhythm No significant change since last tracing Confirmed by Cathren Laine ([id number]) on 12/29/2019 9:04:55 PM   Radiology DG Chest Port 1 View  Result Date: 12/29/2019 CLINICAL DATA:  Chest pain EXAM: PORTABLE CHEST 1 VIEW COMPARISON:  07/31/2018 FINDINGS: Heart and mediastinal contours are within normal limits. No focal opacities or effusions. No acute bony abnormality. IMPRESSION: No active disease. Electronically Signed   By: Charlett Nose M.D.   On: 12/29/2019 21:36    Procedures Procedures (including critical care time)  Medications Ordered in ED Medications  acetaminophen (TYLENOL) tablet 1,000 mg (has no administration in time range)  famotidine (PEPCID) tablet 20 mg (has no administration in time range)  alum & mag hydroxide-simeth (MAALOX/MYLANTA) 200-200-20 MG/5ML suspension 30 mL (has no administration in time range)    ED Course  I have reviewed the triage vital signs and the nursing notes.  Pertinent labs & imaging results that were available during my care of the patient were reviewed by me and considered in my medical decision making (see chart for details).    MDM Rules/Calculators/A&P                     Patient's initial c/o, chest pain, has very broad differential dx including cardiac cp, ptx, pna, gerd, msk cp - many of which carry significant potential morbidity.   Continuous pulse ox and monitor.   Iv ns. Stat labs. Cxr. Ecg.   Reviewed nursing notes and prior charts for additional history.   While awaiting test results, acetaminophen, pepcid, and maalox given for symptom relief. Symptoms persist. Robaxin po.   Labs reviewed/interpreted by me - k sl low. kcl po. Troponin is normal. After symptoms present/constant for 2 days, trop normal - felt not c/w acs.   Symptoms/eval appears more consistent with  msk/chest wall pain vs gi cause.   CXR reviewed/interpreted by me - no pna.   Recheck pt, appears comfortable. No episodes of vomiting in ED. No sob or increased wob. Chest cta bil. Rrr, no g/r/m.   MDM Number of Diagnoses or Management Options   Amount and/or Complexity of Data Reviewed Clinical lab tests: ordered and reviewed Tests in the radiology section of CPT: ordered and reviewed Tests in the medicine section of CPT: ordered and reviewed Decide to obtain previous medical records or to obtain history from someone other than the patient: yes Obtain history from someone other than the patient: yes Review and summarize past medical records: yes Independent visualization of images, tracings, or specimens: yes  Risk of Complications, Morbidity, and/or Mortality Presenting problems: high Diagnostic procedures: high Management options: high     Final Clinical Impression(s) / ED Diagnoses Final diagnoses:  None    Rx / DC Orders ED Discharge Orders    None       Cathren Laine, MD 12/29/19 2327

## 2019-12-29 NOTE — Discharge Instructions (Addendum)
It was our pleasure to provide your ER care today - we hope that you feel better.  Your heart tests look good.   From today's labs, your potassium level was slightly low (3.4) - eat plenty of fruits and vegetables, follow up with primary care doctor.   Take acetaminophen or ibuprofen as need.   If GI symptoms, you may also try pepcid and maalox as need.   Follow up with primary care doctor in the coming week.   Return to ER if worse, new symptoms, fevers, persistent or recurrent chest pain, trouble breathing, or other concern.  You were given medication in the ER - no driving for the next 6 hours.

## 2019-12-29 NOTE — ED Triage Notes (Signed)
Pt arrives via [facility] w/complaints of chest pain that started a couple days ago. Pt NAD. Pt states she hasn't been able to keep any food down.

## 2020-01-02 ENCOUNTER — Emergency Department (HOSPITAL_COMMUNITY): Payer: Medicare Other

## 2020-01-02 ENCOUNTER — Emergency Department (HOSPITAL_COMMUNITY)
Admission: EM | Admit: 2020-01-02 | Discharge: 2020-01-02 | Disposition: A | Discharge status: Home / Self Care | Payer: Medicare Other | Attending: Emergency Medicine | Admitting: Emergency Medicine

## 2020-01-02 ENCOUNTER — Encounter (HOSPITAL_COMMUNITY): Payer: Self-pay

## 2020-01-02 ENCOUNTER — Other Ambulatory Visit: Payer: Self-pay

## 2020-01-02 DIAGNOSIS — K219 Gastro-esophageal reflux disease without esophagitis: Secondary | ICD-10-CM | POA: Diagnosis not present

## 2020-01-02 DIAGNOSIS — R112 Nausea with vomiting, unspecified: Secondary | ICD-10-CM | POA: Insufficient documentation

## 2020-01-02 DIAGNOSIS — Z79899 Other long term (current) drug therapy: Secondary | ICD-10-CM | POA: Insufficient documentation

## 2020-01-02 DIAGNOSIS — R0789 Other chest pain: Secondary | ICD-10-CM | POA: Diagnosis present

## 2020-01-02 LAB — COMPREHENSIVE METABOLIC PANEL
ALT: 18 U/L (ref 0–44)
AST: 16 U/L (ref 15–41)
Albumin: 3.8 g/dL (ref 3.5–5.0)
Alkaline Phosphatase: 73 U/L (ref 38–126)
Anion gap: 9 (ref 5–15)
BUN: 7 mg/dL (ref 6–20)
CO2: 24 mmol/L (ref 22–32)
Calcium: 8.9 mg/dL (ref 8.9–10.3)
Chloride: 102 mmol/L (ref 98–111)
Creatinine, Ser: 0.6 mg/dL (ref 0.44–1.00)
GFR calc Af Amer: >60 mL/min (ref >60)
GFR calc non Af Amer: >60 mL/min (ref >60)
Glucose, Bld: 89 mg/dL (ref 70–99)
Potassium: 3.9 mmol/L (ref 3.5–5.1)
Sodium: 135 mmol/L (ref 135–145)
Total Bilirubin: 1 mg/dL (ref 0.3–1.2)
Total Protein: 7.1 g/dL (ref 6.5–8.1)

## 2020-01-02 LAB — CBC WITH DIFFERENTIAL/PLATELET
Abs Immature Granulocytes: 0.04 10*3/uL (ref 0.00–0.07)
Basophils Absolute: 0 10*3/uL (ref 0.0–0.1)
Basophils Relative: 1 %
Eosinophils Absolute: 0.2 10*3/uL (ref 0.0–0.5)
Eosinophils Relative: 2 %
HCT: 38.1 % (ref 36.0–46.0)
Hemoglobin: 12.4 g/dL (ref 12.0–15.0)
Immature Granulocytes: 1 %
Lymphocytes Relative: 29 %
Lymphs Abs: 2.5 10*3/uL (ref 0.7–4.0)
MCH: 30 pg (ref 26.0–34.0)
MCHC: 32.5 g/dL (ref 30.0–36.0)
MCV: 92.3 fL (ref 80.0–100.0)
Monocytes Absolute: 0.5 10*3/uL (ref 0.1–1.0)
Monocytes Relative: 6 %
Neutro Abs: 5.4 10*3/uL (ref 1.7–7.7)
Neutrophils Relative %: 61 %
Platelets: 284 10*3/uL (ref 150–400)
RBC: 4.13 MIL/uL (ref 3.87–5.11)
RDW: 12 % (ref 11.5–15.5)
WBC: 8.7 10*3/uL (ref 4.0–10.5)
nRBC: 0 % (ref 0.0–0.2)

## 2020-01-02 LAB — TROPONIN I (HIGH SENSITIVITY): Troponin I (High Sensitivity): 7 ng/L (ref <18)

## 2020-01-02 LAB — LIPASE, BLOOD: Lipase: 21 U/L (ref 11–51)

## 2020-01-02 LAB — D-DIMER, QUANTITATIVE: D-Dimer, Quant: <0.27 ug/mL-FEU (ref 0.00–0.50)

## 2020-01-02 LAB — I-STAT BETA HCG BLOOD, ED (MC, WL, AP ONLY): I-stat hCG, quantitative: <5 m[IU]/mL (ref <5)

## 2020-01-02 MED ORDER — ONDANSETRON 4 MG PO TBDP: 4.0000 mg | ORAL_TABLET | Freq: Three times a day (TID) | ORAL | 0 refills | Status: DC | PRN

## 2020-01-02 MED ORDER — PANTOPRAZOLE SODIUM 20 MG PO TBEC: 20.0000 mg | DELAYED_RELEASE_TABLET | Freq: Every day | ORAL | 0 refills | Status: DC

## 2020-01-02 MED ORDER — ALUM & MAG HYDROXIDE-SIMETH 200-200-20 MG/5ML PO SUSP
30.0000 mL | Freq: Once | ORAL | Status: AC
  Administered 2020-01-02: 30 mL via ORAL
  Filled 2020-01-02: qty 30

## 2020-01-02 MED ORDER — FAMOTIDINE 20 MG PO TABS: 20.0000 mg | ORAL_TABLET | Freq: Two times a day (BID) | ORAL | 0 refills | Status: DC

## 2020-01-02 MED ORDER — LIDOCAINE VISCOUS HCL 2 % MT SOLN
15.0000 mL | Freq: Once | OROMUCOSAL | Status: AC
  Administered 2020-01-02: 15 mL via ORAL
  Filled 2020-01-02: qty 15

## 2020-01-02 MED ORDER — ONDANSETRON HCL 4 MG/2ML IJ SOLN
4.0000 mg | Freq: Once | INTRAMUSCULAR | Status: AC
  Administered 2020-01-02: 4 mg via INTRAVENOUS
  Filled 2020-01-02: qty 2

## 2020-01-02 MED ORDER — SODIUM CHLORIDE 0.9 % IV BOLUS
500.0000 mL | Freq: Once | INTRAVENOUS | Status: AC
  Administered 2020-01-02: 500 mL via INTRAVENOUS

## 2020-01-02 NOTE — ED Provider Notes (Signed)
[facility] EMERGENCY DEPARTMENT Provider Note   CSN: [id number] Arrival date & time: 01/02/20  1641     History Chief Complaint  Patient presents with  . Chest Pain    Felicia Sutton is a 39 y.o. female.  The history is provided by the patient and medical records. No language interpreter was used.  Chest Pain  Felicia Sutton is a 39 y.o. female who presents to the Emergency Department complaining of chest pain. She presents the emergency department complaining of two days of central chest pain. Pain is described as a pressure sensation that comes and goes. Pain is worse with deep breathing. Shortly after her pain began she developed associated nausea and vomiting. She has experienced 2 to 3 episodes of emesis daily. Her emesis is described as clear. She denies any fevers, abdominal pain, diarrhea, dysuria, leg swelling or pain. No prior similar symptoms. She has no medical problems and takes no medications. She has no personal or family history of DVT or coronary artery disease.    History reviewed. No pertinent past medical history.  There are no problems to display for this patient.   History reviewed. No pertinent surgical history.   OB History   No obstetric history on file.     History reviewed. No pertinent family history.  Social History   Tobacco Use  . Smoking status: Never Smoker  . Smokeless tobacco: Never Used  Substance Use Topics  . Alcohol use: Never  . Drug use: Never    Home Medications Prior to Admission medications   Medication Sig Start Date End Date Taking? Authorizing Provider  cyclobenzaprine (FLEXERIL) 10 MG tablet Take 10 mg by mouth 2 (two) times daily as needed. 12/11/19   [provider]  diclofenac (VOLTAREN) 75 MG EC tablet Take 75 mg by mouth 2 (two) times daily. 11/24/19   [provider]  famotidine (PEPCID) 20 MG tablet Take 1 tablet (20 mg total) by mouth 2 (two) times daily. 01/02/20   Quintella Reichert, MD  ondansetron (ZOFRAN ODT) 4 MG disintegrating tablet Take 1 tablet (4 mg total) by mouth every 8 (eight) hours as needed for nausea or vomiting. 01/02/20   Quintella Reichert, MD  pantoprazole (PROTONIX) 20 MG tablet Take 1 tablet (20 mg total) by mouth daily. 01/02/20   Quintella Reichert, MD  traMADol (ULTRAM) 50 MG tablet Take 50 mg by mouth every 6 (six) hours as needed. 12/11/19   [provider]    Allergies    Patient has no known allergies.  Review of Systems   Review of Systems  Cardiovascular: Positive for chest pain.  All other systems reviewed and are negative.   Physical Exam Updated Vital Signs BP 124/83   Pulse 90   Temp 99.1 F (37.3 C) (Oral)   Resp 14   Ht 5\' 2"  (1.575 m)   Wt 100.2 kg   SpO2 100%   BMI 40.42 kg/m   Physical Exam Vitals and nursing note reviewed.  Constitutional:      Appearance: She is well-developed.  HENT:     Head: Normocephalic and atraumatic.  Cardiovascular:     Rate and Rhythm: Normal rate and regular rhythm.     Heart sounds: No murmur.  Pulmonary:     Effort: Pulmonary effort is normal. No respiratory distress.     Breath sounds: Normal breath sounds.  Abdominal:     Palpations: Abdomen is soft.     Tenderness: There is no abdominal tenderness.  There is no guarding or rebound.  Musculoskeletal:        General: No swelling or tenderness.  Skin:    General: Skin is warm and dry.  Neurological:     Mental Status: She is alert and oriented to person, place, and time.  Psychiatric:        Behavior: Behavior normal.     ED Results / Procedures / Treatments   Labs (all labs ordered are listed, but only abnormal results are displayed) Labs Reviewed  COMPREHENSIVE METABOLIC PANEL  CBC WITH DIFFERENTIAL/PLATELET  LIPASE, BLOOD  D-DIMER, QUANTITATIVE (NOT AT [facility])  I-STAT BETA HCG BLOOD, ED ([facility], [facility], [facility] ONLY)  TROPONIN I (HIGH SENSITIVITY)  TROPONIN I (HIGH SENSITIVITY)    EKG EKG  Interpretation  Date/Time:  Tuesday January 02 2020 16:51:00 EDT Ventricular Rate:  94 PR Interval:    QRS Duration: 75 QT Interval:  341 QTC Calculation: 427 R Axis:   36 Text Interpretation: Sinus rhythm No significant change since last tracing Confirmed by Tilden Fossa [phone]) on 01/02/2020 4:59:46 PM   Radiology DG Chest 2 View  Result Date: 01/02/2020 CLINICAL DATA:  Chest pain. Additional history provided: Patient reports chest pain and nausea gradually worsening over 2 days, chest pain 8/10, pressure in right central chest, worse with inspiration. EXAM: CHEST - 2 VIEW COMPARISON:  Chest radiograph 12/29/2019 FINDINGS: Heart size within normal limits. No evidence of airspace consolidation within the lungs. No evidence of pleural effusion or pneumothorax. No acute bony abnormality is identified. IMPRESSION: No evidence of acute cardiopulmonary abnormality. Electronically Signed   By: Jackey Loge DO   On: 01/02/2020 18:13    Procedures Procedures (including critical care time)  Medications Ordered in ED Medications  sodium chloride 0.9 % bolus 500 mL (0 mLs Intravenous Stopped 01/02/20 1900)  ondansetron (ZOFRAN) injection 4 mg (4 mg Intravenous Given 01/02/20 1718)  alum & mag hydroxide-simeth (MAALOX/MYLANTA) 200-200-20 MG/5ML suspension 30 mL (30 mLs Oral Given 01/02/20 1720)    And  lidocaine (XYLOCAINE) 2 % viscous mouth solution 15 mL (15 mLs Oral Given 01/02/20 1720)    ED Course  I have reviewed the triage vital signs and the nursing notes.  Pertinent labs & imaging results that were available during my care of the patient were reviewed by me and considered in my medical decision making (see chart for details).    MDM Rules/Calculators/A&P                      Pt here for evaluation of two days of chest pain, vomiting. Patient is non-toxic appearing on evaluation with no significant abdominal tenderness. EKG is without acute ischemic changes. Presentation is not  consistent with PE, ACS, dissection, pancreatitis, cholecystitis, bowel obstruction, appendicitis. On repeat assessment after G.I. cocktail her pain is improved. No recurrent vomiting. Discussed with patient home care for chest pain and vomiting, likely secondary to reflux/gastritis. Discussed outpatient follow-up as well as return precautions. Final Clinical Impression(s) / ED Diagnoses Final diagnoses:  Atypical chest pain  Gastroesophageal reflux disease, unspecified whether esophagitis present    Rx / DC Orders ED Discharge Orders         Ordered    ondansetron (ZOFRAN ODT) 4 MG disintegrating tablet  Every 8 hours PRN     01/02/20 1922    pantoprazole (PROTONIX) 20 MG tablet  Daily     01/02/20 1922    famotidine (PEPCID) 20 MG tablet  2 times daily  01/02/20 Clover Mealy, MD 01/02/20 2105

## 2020-01-02 NOTE — ED Notes (Signed)
Pt provided with fluids for PO challenge

## 2020-01-02 NOTE — ED Notes (Signed)
Pt ambulated independently to bathroom.

## 2020-01-02 NOTE — ED Triage Notes (Signed)
Pt BIB [facility] from home w/ c/o CP and nausea gradually worsening over 2 days, CP 8/10, pressure in R central chest, worse w/ inspiration. Pt A&Ox4, VS stable, received 4 mg Zofran per EMS w/o relief. No hx, meds, allergies.

## 2020-01-02 NOTE — ED Notes (Signed)
Pt tolerating fluids.   

## 2020-01-09 ENCOUNTER — Other Ambulatory Visit: Payer: Self-pay

## 2020-01-09 ENCOUNTER — Emergency Department (HOSPITAL_COMMUNITY)
Admission: EM | Admit: 2020-01-09 | Discharge: 2020-01-10 | Disposition: A | Discharge status: Home / Self Care | Payer: Medicare Other | Attending: Emergency Medicine | Admitting: Emergency Medicine

## 2020-01-09 ENCOUNTER — Encounter (HOSPITAL_COMMUNITY): Payer: Self-pay | Admitting: Emergency Medicine

## 2020-01-09 DIAGNOSIS — R112 Nausea with vomiting, unspecified: Secondary | ICD-10-CM | POA: Diagnosis not present

## 2020-01-09 DIAGNOSIS — R1011 Right upper quadrant pain: Secondary | ICD-10-CM | POA: Diagnosis present

## 2020-01-09 DIAGNOSIS — Z79899 Other long term (current) drug therapy: Secondary | ICD-10-CM | POA: Diagnosis not present

## 2020-01-09 LAB — URINALYSIS, ROUTINE W REFLEX MICROSCOPIC
Bilirubin Urine: NEGATIVE
Glucose, UA: NEGATIVE mg/dL
Hgb urine dipstick: NEGATIVE
Ketones, ur: NEGATIVE mg/dL
Leukocytes,Ua: NEGATIVE
Nitrite: NEGATIVE
Protein, ur: NEGATIVE mg/dL
Specific Gravity, Urine: 1.003 — ABNORMAL LOW (ref 1.005–1.030)
pH: 6 (ref 5.0–8.0)

## 2020-01-09 LAB — CBC
HCT: 38.8 % (ref 36.0–46.0)
Hemoglobin: 12.7 g/dL (ref 12.0–15.0)
MCH: 30 pg (ref 26.0–34.0)
MCHC: 32.7 g/dL (ref 30.0–36.0)
MCV: 91.7 fL (ref 80.0–100.0)
Platelets: 378 10*3/uL (ref 150–400)
RBC: 4.23 MIL/uL (ref 3.87–5.11)
RDW: 11.9 % (ref 11.5–15.5)
WBC: 9.7 10*3/uL (ref 4.0–10.5)
nRBC: 0 % (ref 0.0–0.2)

## 2020-01-09 LAB — I-STAT BETA HCG BLOOD, ED (MC, WL, AP ONLY): I-stat hCG, quantitative: <5 m[IU]/mL (ref <5)

## 2020-01-09 MED ORDER — SODIUM CHLORIDE 0.9% FLUSH: 3.0000 mL | Freq: Once | INTRAVENOUS | Status: DC

## 2020-01-09 NOTE — ED Triage Notes (Signed)
Pt c/o ruq abd pain for the past 2 days with nausea and vomiting.

## 2020-01-10 DIAGNOSIS — R1011 Right upper quadrant pain: Secondary | ICD-10-CM | POA: Diagnosis not present

## 2020-01-10 LAB — COMPREHENSIVE METABOLIC PANEL
ALT: 22 U/L (ref 0–44)
AST: 17 U/L (ref 15–41)
Albumin: 4 g/dL (ref 3.5–5.0)
Alkaline Phosphatase: 83 U/L (ref 38–126)
Anion gap: 12 (ref 5–15)
BUN: 8 mg/dL (ref 6–20)
CO2: 25 mmol/L (ref 22–32)
Calcium: 9.1 mg/dL (ref 8.9–10.3)
Chloride: 100 mmol/L (ref 98–111)
Creatinine, Ser: 0.62 mg/dL (ref 0.44–1.00)
GFR calc Af Amer: >60 mL/min (ref >60)
GFR calc non Af Amer: >60 mL/min (ref >60)
Glucose, Bld: 99 mg/dL (ref 70–99)
Potassium: 3.6 mmol/L (ref 3.5–5.1)
Sodium: 137 mmol/L (ref 135–145)
Total Bilirubin: 0.3 mg/dL (ref 0.3–1.2)
Total Protein: 7.4 g/dL (ref 6.5–8.1)

## 2020-01-10 LAB — LIPASE, BLOOD: Lipase: 26 U/L (ref 11–51)

## 2020-01-10 MED ORDER — METOCLOPRAMIDE HCL 5 MG/ML IJ SOLN
10.0000 mg | Freq: Once | INTRAMUSCULAR | Status: AC
  Administered 2020-01-10: 10 mg via INTRAVENOUS
  Filled 2020-01-10: qty 2

## 2020-01-10 MED ORDER — HYDROCODONE-ACETAMINOPHEN 5-325 MG PO TABS
2.0000 | ORAL_TABLET | Freq: Once | ORAL | Status: AC
  Administered 2020-01-10: 2 via ORAL
  Filled 2020-01-10: qty 2

## 2020-01-10 MED ORDER — DIPHENHYDRAMINE HCL 50 MG/ML IJ SOLN
25.0000 mg | Freq: Once | INTRAMUSCULAR | Status: AC
  Administered 2020-01-10: 25 mg via INTRAVENOUS
  Filled 2020-01-10: qty 1

## 2020-01-10 NOTE — ED Notes (Signed)
Patient verbalizes understanding of discharge instructions. Opportunity for questioning and answers were provided. Armband removed by staff, pt discharged from ED.  

## 2020-01-10 NOTE — ED Provider Notes (Signed)
[facility] EMERGENCY DEPARTMENT Provider Note   CSN: [id number] Arrival date & time: 01/09/20  2204     History Chief Complaint  Patient presents with  . Abdominal Pain    Felicia Sutton is a 39 y.o. female.  The history is provided by the patient.  Abdominal Pain Pain location:  RUQ Pain quality: sharp   Pain radiates to:  Does not radiate Pain severity:  Severe Onset quality:  Gradual Duration:  2 days Timing:  Constant Progression:  Worsening Chronicity:  New Relieved by:  Nothing Worsened by:  Movement and palpation Associated symptoms: nausea and vomiting   Associated symptoms: no chest pain, no diarrhea, no dysuria, no fever, no hematemesis, no hematochezia and no shortness of breath   Patient reports onset of right upper quadrant abdominal pain about 2 days ago.  She has associated nausea vomiting is nonbloody.  She reports this pain feels similar to prior episodes of gallbladder attack, but she has had a cholecystectomy. No chest pain or shortness of breath.    PMH-none surg hx - cholecystectomy OB History   No obstetric history on file.     No family history on file.  Social History   Tobacco Use  . Smoking status: Never Smoker  . Smokeless tobacco: Never Used  Substance Use Topics  . Alcohol use: Never  . Drug use: Never    Home Medications Prior to Admission medications   Medication Sig Start Date End Date Taking? Authorizing Provider  cyclobenzaprine (FLEXERIL) 10 MG tablet Take 10 mg by mouth 2 (two) times daily as needed. 12/11/19   [provider]  diclofenac (VOLTAREN) 75 MG EC tablet Take 75 mg by mouth 2 (two) times daily. 11/24/19   [provider]  famotidine (PEPCID) 20 MG tablet Take 1 tablet (20 mg total) by mouth 2 (two) times daily. 01/02/20   Quintella Reichert, MD  ondansetron (ZOFRAN ODT) 4 MG disintegrating tablet Take 1 tablet (4 mg total) by mouth every 8 (eight) hours as needed for nausea or  vomiting. 01/02/20   Quintella Reichert, MD  pantoprazole (PROTONIX) 20 MG tablet Take 1 tablet (20 mg total) by mouth daily. 01/02/20   Quintella Reichert, MD  traMADol (ULTRAM) 50 MG tablet Take 50 mg by mouth every 6 (six) hours as needed. 12/11/19   [provider]    Allergies    Patient has no known allergies.  Review of Systems   Review of Systems  Constitutional: Negative for fever.  Respiratory: Negative for shortness of breath.   Cardiovascular: Negative for chest pain.  Gastrointestinal: Positive for abdominal pain, nausea and vomiting. Negative for blood in stool, diarrhea, hematemesis and hematochezia.  Genitourinary: Negative for dysuria.  All other systems reviewed and are negative.   Physical Exam Updated Vital Signs BP 116/78   Pulse 83   Temp 98.5 F (36.9 C) (Oral)   Resp 15   Ht 1.575 m (5\' 2" )   Wt 100.2 kg   SpO2 100%   BMI 40.40 kg/m   Physical Exam CONSTITUTIONAL: Well developed/well nourished HEAD: Normocephalic/atraumatic EYES: EOMI/PERRL, no icterus ENMT: Mucous membranes moist NECK: supple no meningeal signs SPINE/BACK:entire spine nontender CV: S1/S2 noted, no murmurs/rubs/gallops noted LUNGS: Lungs are clear to auscultation bilaterally, no apparent distress ABDOMEN: soft, moderate RUQ tenderness, no rebound or guarding, bowel sounds noted throughout abdomen GU:no cva tenderness NEURO: Pt is awake/alert/appropriate, moves all extremitiesx4.  No facial droop.  EXTREMITIES: pulses normal/equal, full ROM SKIN: warm, color normal  PSYCH: no abnormalities of mood noted, alert and oriented to situation  ED Results / Procedures / Treatments   Labs (all labs ordered are listed, but only abnormal results are displayed) Labs Reviewed  URINALYSIS, ROUTINE W REFLEX MICROSCOPIC - Abnormal; Notable for the following components:      Result Value   Color, Urine STRAW (*)    Specific Gravity, Urine 1.003 (*)    All other components within normal  limits  LIPASE, BLOOD  COMPREHENSIVE METABOLIC PANEL  CBC  I-STAT BETA HCG BLOOD, ED ([facility], [facility], [facility] ONLY)    EKG EKG Interpretation  Date/Time:  Wednesday January 10 2020 04:13:27 EDT Ventricular Rate:  81 PR Interval:    QRS Duration: 75 QT Interval:  368 QTC Calculation: 428 R Axis:   47 Text Interpretation: Sinus rhythm No significant change since last tracing Confirmed by Zadie Rhine ([id number]) on 01/10/2020 4:15:43 AM   Radiology No results found.  Procedures Procedures  Medications Ordered in ED Medications  sodium chloride flush (NS) 0.9 % injection 3 mL (has no administration in time range)  HYDROcodone-acetaminophen (NORCO/VICODIN) 5-325 MG per tablet 2 tablet (has no administration in time range)  metoCLOPramide (REGLAN) injection 10 mg (10 mg Intravenous Given 01/10/20 0525)  diphenhydrAMINE (BENADRYL) injection 25 mg (25 mg Intravenous Given 01/10/20 0525)    ED Course  I have reviewed the triage vital signs and the nursing notes.  Pertinent labs results that were available during my care of the patient were reviewed by me and considered in my medical decision making (see chart for details).    MDM Rules/Calculators/A&P                      5:14 AM Patient reports pain and vomiting.  Reglan/Benadryl has been ordered Review of care everywhere reveals she has had up to 6 CT abdomen pelvis scans done in the past year in multiple health systems Labs are reassuring. 6:47 AM Patient reports no change in her pain.  Patient appears comfortable watching television no acute distress.  I reviewed previous CT scans with patient,  typically  kidney stones but no other acute findings.  At this point low suspicion for acute emergent pathology. Patient be given oral pain medications and discharged home She reports recently moving to [facility] [place], will refer to PCP Final Clinical Impression(s) / ED Diagnoses Final diagnoses:  RUQ pain    Rx / DC Orders ED Discharge  Orders    None       Zadie Rhine, MD 01/10/20 [id number]

## 2020-01-14 ENCOUNTER — Emergency Department (HOSPITAL_COMMUNITY)
Admission: EM | Admit: 2020-01-14 | Discharge: 2020-01-14 | Discharge status: Against Medical Advice | Payer: Medicare Other | Attending: Emergency Medicine | Admitting: Emergency Medicine

## 2020-01-14 ENCOUNTER — Encounter (HOSPITAL_COMMUNITY): Payer: Self-pay | Admitting: Emergency Medicine

## 2020-01-14 ENCOUNTER — Other Ambulatory Visit: Payer: Self-pay

## 2020-01-14 DIAGNOSIS — F329 Major depressive disorder, single episode, unspecified: Secondary | ICD-10-CM | POA: Diagnosis present

## 2020-01-14 DIAGNOSIS — Z046 Encounter for general psychiatric examination, requested by authority: Secondary | ICD-10-CM | POA: Diagnosis not present

## 2020-01-14 DIAGNOSIS — F322 Major depressive disorder, single episode, severe without psychotic features: Secondary | ICD-10-CM | POA: Insufficient documentation

## 2020-01-14 DIAGNOSIS — Z634 Disappearance and death of family member: Secondary | ICD-10-CM | POA: Diagnosis not present

## 2020-01-14 DIAGNOSIS — F4321 Adjustment disorder with depressed mood: Secondary | ICD-10-CM | POA: Diagnosis not present

## 2020-01-14 NOTE — ED Provider Notes (Signed)
[facility] EMERGENCY DEPARTMENT Provider Note   CSN: [id number] Arrival date & time: 01/14/20  7322     History Chief Complaint  Patient presents with  . Depression    Der Gagliano is a 39 y.o. female.  Patient presents to the emergency department for evaluation of depression and grief.  Patient reports that she lost her son a week ago.  She has having severe sadness and depression as a result.  She has no homicidal or suicidal.  She does not have a history of depression.  She does not know where to turn for help.        History reviewed. No pertinent past medical history.  There are no problems to display for this patient.   History reviewed. No pertinent surgical history.   OB History   No obstetric history on file.     History reviewed. No pertinent family history.  Social History   Tobacco Use  . Smoking status: Never Smoker  . Smokeless tobacco: Never Used  Substance Use Topics  . Alcohol use: Never  . Drug use: Never    Home Medications Prior to Admission medications   Medication Sig Start Date End Date Taking? Authorizing Provider  famotidine (PEPCID) 20 MG tablet Take 1 tablet (20 mg total) by mouth 2 (two) times daily. Patient not taking: Reported on 01/10/2020 01/02/20   Quintella Reichert, MD  ondansetron (ZOFRAN ODT) 4 MG disintegrating tablet Take 1 tablet (4 mg total) by mouth every 8 (eight) hours as needed for nausea or vomiting. Patient not taking: Reported on 01/10/2020 01/02/20   Quintella Reichert, MD  pantoprazole (PROTONIX) 20 MG tablet Take 1 tablet (20 mg total) by mouth daily. Patient not taking: Reported on 01/10/2020 01/02/20   Quintella Reichert, MD    Allergies    Patient has no known allergies.  Review of Systems   Review of Systems  Psychiatric/Behavioral: Positive for dysphoric mood. Negative for suicidal ideas.  All other systems reviewed and are negative.   Physical Exam Updated Vital Signs BP 136/83   Pulse 87   Temp 98.3  F (36.8 C)   Resp 18   Ht 5\' 2"  (1.575 m)   Wt 100.2 kg   SpO2 100%   BMI 40.42 kg/m   Physical Exam Vitals and nursing note reviewed.  Constitutional:      General: She is not in acute distress.    Appearance: Normal appearance. She is well-developed.  HENT:     Head: Normocephalic and atraumatic.     Right Ear: Hearing normal.     Left Ear: Hearing normal.     Nose: Nose normal.  Eyes:     Conjunctiva/sclera: Conjunctivae normal.     Pupils: Pupils are equal, round, and reactive to light.  Cardiovascular:     Rate and Rhythm: Regular rhythm.     Heart sounds: S1 normal and S2 normal. No murmur. No friction rub. No gallop.   Pulmonary:     Effort: Pulmonary effort is normal. No respiratory distress.     Breath sounds: Normal breath sounds.  Chest:     Chest wall: No tenderness.  Abdominal:     General: Bowel sounds are normal.     Palpations: Abdomen is soft.     Tenderness: There is no abdominal tenderness. There is no guarding or rebound. Negative signs include Murphy's sign and McBurney's sign.     Hernia: No hernia is present.  Musculoskeletal:        General:  Normal range of motion.     Cervical back: Normal range of motion and neck supple.  Skin:    General: Skin is warm and dry.     Findings: No rash.  Neurological:     Mental Status: She is alert and oriented to person, place, and time.     GCS: GCS eye subscore is 4. GCS verbal subscore is 5. GCS motor subscore is 6.     Cranial Nerves: No cranial nerve deficit.     Sensory: No sensory deficit.     Coordination: Coordination normal.  Psychiatric:        Mood and Affect: Mood is depressed.        Speech: Speech normal.        Behavior: Behavior normal.        Thought Content: Thought content normal.     ED Results / Procedures / Treatments   Labs (all labs ordered are listed, but only abnormal results are displayed) Labs Reviewed - No data to display  EKG None  Radiology No results  found.  Procedures Procedures (including critical care time)  Medications Ordered in ED Medications - No data to display  ED Course  I have reviewed the triage vital signs and the nursing notes.  Pertinent labs & imaging results that were available during my care of the patient were reviewed by me and considered in my medical decision making (see chart for details).    MDM Rules/Calculators/A&P                      Patient not homicidal or suicidal.  She is experiencing a severe grief reaction secondary to the loss of her son.  We will have TTS consult on the patient to help with her grief.  Final Clinical Impression(s) / ED Diagnoses Final diagnoses:  Grief at loss of child    Rx / DC Orders ED Discharge Orders    None       Mills Mitton, Canary Brim, MD 01/14/20 0600

## 2020-01-14 NOTE — ED Notes (Signed)
Pt left AMA, Dr Estell Harpin aware.

## 2020-01-14 NOTE — ED Triage Notes (Signed)
Pt here due to being depressed since last week. Pts son passed away recently with COVID. Pt denies SI/HI.
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
## 2020-01-23 ENCOUNTER — Other Ambulatory Visit: Payer: Self-pay

## 2020-01-23 ENCOUNTER — Emergency Department (HOSPITAL_COMMUNITY)
Admission: EM | Admit: 2020-01-23 | Discharge: 2020-01-23 | Discharge status: Against Medical Advice | Payer: Medicare Other

## 2020-01-24 ENCOUNTER — Encounter (HOSPITAL_COMMUNITY): Payer: Self-pay | Admitting: *Deleted

## 2020-01-24 ENCOUNTER — Other Ambulatory Visit: Payer: Self-pay

## 2020-01-24 ENCOUNTER — Emergency Department (HOSPITAL_COMMUNITY)
Admission: EM | Admit: 2020-01-24 | Discharge: 2020-01-24 | Disposition: A | Discharge status: Home / Self Care | Payer: Medicare Other | Attending: Emergency Medicine | Admitting: Emergency Medicine

## 2020-01-24 DIAGNOSIS — N63 Unspecified lump in unspecified breast: Secondary | ICD-10-CM | POA: Insufficient documentation

## 2020-01-24 DIAGNOSIS — Z760 Encounter for issue of repeat prescription: Secondary | ICD-10-CM | POA: Diagnosis not present

## 2020-01-24 MED ORDER — ONDANSETRON 4 MG PO TBDP: 4.0000 mg | ORAL_TABLET | Freq: Three times a day (TID) | ORAL | 0 refills | Status: DC | PRN

## 2020-01-24 NOTE — ED Provider Notes (Signed)
[facility] EMERGENCY DEPARTMENT Provider Note   CSN: [id number] Arrival date & time: 01/24/20  1933    History Chief Complaint  Patient presents with  . knot to chest    Felicia Sutton is a 39 y.o. female with no past medical history who presents for evaluation of 9 to left breast.  Patient states last night she noticed approximately 1 cm firm, mobile mass to her left breast.  She is unsure how its been there.  She has no overlying erythema, tenderness, fluctuance or induration.  No nipple inversion, nipple discharge.  Denies additional aggravating or alleviating factors.  Recently completed her menstrual cycle.  She denies any fever, chills, nausea, vomiting, chest pain, shortness of breath abdominal pain, diarrhea, dysuria, pelvic pain, vaginal discharge, concerns for STDs.  Denies chance of pregnancy.  Denies additional aggravating or alleviating factors.  She is not a primary care provider.  Patient states she does occasionally get nauseous when she has her menstrual cycles.  She was previously prescribed Zofran for this which she helped.  She denies any current nausea however is requesting Zofran prescription to have her next menstrual cycle.   History obtained from patient and past medical records.  No interpreter is used.  HPI     History reviewed. No pertinent past medical history.  There are no problems to display for this patient.   History reviewed. No pertinent surgical history.   OB History   No obstetric history on file.     History reviewed. No pertinent family history.  Social History   Tobacco Use  . Smoking status: Never Smoker  . Smokeless tobacco: Never Used  Substance Use Topics  . Alcohol use: Never  . Drug use: Never    Home Medications Prior to Admission medications   Medication Sig Start Date End Date Taking? Authorizing Provider  famotidine (PEPCID) 20 MG tablet Take 1 tablet (20 mg total) by mouth 2 (two) times daily. Patient not taking:  Reported on 01/10/2020 01/02/20   Tilden Fossa, MD  ondansetron [facility] ODT) 4 MG disintegrating tablet Take 1 tablet (4 mg total) by mouth every 8 (eight) hours as needed for nausea or vomiting. 01/24/20   Henderly, Britni A, PA-C  pantoprazole (PROTONIX) 20 MG tablet Take 1 tablet (20 mg total) by mouth daily. Patient not taking: Reported on 01/10/2020 01/02/20   Tilden Fossa, MD    Allergies    Patient has no known allergies.  Review of Systems   Review of Systems  Constitutional: Negative.   HENT: Negative.   Eyes: Negative.   Respiratory: Negative.   Cardiovascular: Negative.        Breast mass  Gastrointestinal: Negative.   Genitourinary: Negative.   Musculoskeletal: Negative.   Skin: Negative.   Neurological: Negative.   All other systems reviewed and are negative.   Physical Exam Updated Vital Signs BP 121/81 (BP Location: Left Arm)   Pulse 82   Temp 98 F (36.7 C) (Oral)   Resp 14   Ht 5\' 2"  (1.575 m)   Wt 100.2 kg   LMP 01/10/2020   SpO2 99%   BMI 40.42 kg/m   Physical Exam Vitals and nursing note reviewed. Exam conducted with a chaperone present.  Constitutional:      General: She is not in acute distress.    Appearance: She is well-developed. She is not ill-appearing, toxic-appearing or diaphoretic.  HENT:     Head: Normocephalic and atraumatic.     Nose: Nose normal.  Mouth/Throat:     Mouth: Mucous membranes are moist.     Pharynx: Oropharynx is clear.  Eyes:     Pupils: Pupils are equal, round, and reactive to light.  Cardiovascular:     Rate and Rhythm: Normal rate.     Pulses: Normal pulses.     Heart sounds: Normal heart sounds.  Pulmonary:     Effort: Pulmonary effort is normal. No respiratory distress.     Breath sounds: Normal breath sounds.  Chest:     Breasts:        Right: Normal.        Left: Mass present. No swelling, bleeding, inverted nipple, nipple discharge, skin change or tenderness.       Comments: 1 centimeter round,  firm, nontender mobile mass to left breast in the inner upper quadrant.  No nipple inversion.  No nipple drainage. Abdominal:     General: Bowel sounds are normal. There is no distension.     Tenderness: There is no abdominal tenderness. There is no right CVA tenderness, left CVA tenderness or guarding.  Musculoskeletal:        General: Normal range of motion.     Cervical back: Normal range of motion.  Skin:    General: Skin is warm and dry.     Capillary Refill: Capillary refill takes less than 2 seconds.     Comments: No edema, erythema or warmth.  No fluctuance, induration.  No peu de orange skin texture.  Neurological:     General: No focal deficit present.     Mental Status: She is alert and oriented to person, place, and time.    ED Results / Procedures / Treatments   Labs (all labs ordered are listed, but only abnormal results are displayed) Labs Reviewed - No data to display  EKG None  Radiology No results found.  Procedures Procedures (including critical care time)  Medications Ordered in ED Medications - No data to display  ED Course  I have reviewed the triage vital signs and the nursing notes.  Pertinent labs & imaging results that were available during my care of the patient were reviewed by me and considered in my medical decision making (see chart for details).  39 year old female presents for evaluation of breast mass.  Afebrile, nonseptic, not ill-appearing.  Patient with 1 cm firm, mobile mass to left upper breast.  No overlying skin changes.  No fluctuance, induration, edema, erythema or warmth.  Exam not consistent with abscess.  We will have her follow-up outpatient with [facility] for possible mammogram/ ultrasound.  Patient denies prior mammogram. She does not PCP currently.  Patient also requesting refill of Zofran. Has history of chronic emesis which she relates to around her menstrual cycles. She has no current emesis. Abdomen soft, nontender.  Nonsurgical abdomen.  Declines pregnancy test here in ED however negative 01/09/20.  The patient has been appropriately medically screened and/or stabilized in the ED. I have low suspicion for any other emergent medical condition which would require further screening, evaluation or treatment in the ED or require inpatient management.  Patient is hemodynamically stable and in no acute distress.  Patient able to ambulate in department prior to ED.  Evaluation does not show acute pathology that would require ongoing or additional emergent interventions while in the emergency department or further inpatient treatment.  I have discussed the diagnosis with the patient and answered all questions.  Pain is been managed while in the emergency department and  patient has no further complaints prior to discharge.  Patient is comfortable with plan discussed in room and is stable for discharge at this time.  I have discussed strict return precautions for returning to the emergency department.  Patient was encouraged to follow-up with PCP/specialist refer to at discharge.   MDM Rules/Calculators/A&P                       Final Clinical Impression(s) / ED Diagnoses Final diagnoses:  Breast mass  Medication refill    Rx / DC Orders ED Discharge Orders         Ordered    Ambulatory referral to Obstetrics / Gynecology     01/24/20 2035    ondansetron (ZOFRAN ODT) 4 MG disintegrating tablet  Every 8 hours PRN     01/24/20 2037           Henderly, Britni A, PA-C 01/24/20 2038    Vanetta Mulders, MD 01/25/20 2014

## 2020-01-24 NOTE — ED Triage Notes (Signed)
Pt with knot to chest noted last night, pt unsure how long it's been there.

## 2020-01-24 NOTE — Discharge Instructions (Addendum)
Follow up outpatient for possible ultrasound with biopsy. Call to schedule an appointment.  I have refill your Zofran. Return for new or worsening symptoms

## 2020-01-31 ENCOUNTER — Encounter (HOSPITAL_COMMUNITY): Payer: Self-pay | Admitting: Emergency Medicine

## 2020-01-31 ENCOUNTER — Inpatient Hospital Stay (HOSPITAL_COMMUNITY)
Admission: EM | Admit: 2020-01-31 | Discharge: 2020-02-03 | DRG: 392 | Disposition: A | Discharge status: Home / Self Care | Payer: Medicare Other | Attending: Internal Medicine | Admitting: Internal Medicine

## 2020-01-31 ENCOUNTER — Other Ambulatory Visit: Payer: Self-pay

## 2020-01-31 ENCOUNTER — Encounter: Payer: Medicare Other | Admitting: Obstetrics and Gynecology

## 2020-01-31 ENCOUNTER — Emergency Department (HOSPITAL_COMMUNITY)
Admission: EM | Admit: 2020-01-31 | Discharge: 2020-01-31 | Disposition: A | Discharge status: Home / Self Care | Payer: Medicare Other | Source: Home / Self Care | Attending: Emergency Medicine | Admitting: Emergency Medicine

## 2020-01-31 DIAGNOSIS — N644 Mastodynia: Secondary | ICD-10-CM

## 2020-01-31 DIAGNOSIS — K3184 Gastroparesis: Secondary | ICD-10-CM | POA: Diagnosis not present

## 2020-01-31 DIAGNOSIS — N6320 Unspecified lump in the left breast, unspecified quadrant: Secondary | ICD-10-CM | POA: Diagnosis present

## 2020-01-31 DIAGNOSIS — E669 Obesity, unspecified: Secondary | ICD-10-CM | POA: Diagnosis present

## 2020-01-31 DIAGNOSIS — E876 Hypokalemia: Secondary | ICD-10-CM | POA: Diagnosis present

## 2020-01-31 DIAGNOSIS — K920 Hematemesis: Secondary | ICD-10-CM | POA: Diagnosis present

## 2020-01-31 DIAGNOSIS — K2950 Unspecified chronic gastritis without bleeding: Secondary | ICD-10-CM | POA: Diagnosis present

## 2020-01-31 DIAGNOSIS — K2100 Gastro-esophageal reflux disease with esophagitis, without bleeding: Secondary | ICD-10-CM | POA: Diagnosis present

## 2020-01-31 DIAGNOSIS — R112 Nausea with vomiting, unspecified: Secondary | ICD-10-CM | POA: Insufficient documentation

## 2020-01-31 DIAGNOSIS — F17210 Nicotine dependence, cigarettes, uncomplicated: Secondary | ICD-10-CM | POA: Diagnosis present

## 2020-01-31 DIAGNOSIS — Z9049 Acquired absence of other specified parts of digestive tract: Secondary | ICD-10-CM

## 2020-01-31 DIAGNOSIS — Z20822 Contact with and (suspected) exposure to covid-19: Secondary | ICD-10-CM | POA: Diagnosis present

## 2020-01-31 DIAGNOSIS — R000 Tachycardia, unspecified: Secondary | ICD-10-CM | POA: Diagnosis present

## 2020-01-31 DIAGNOSIS — G8929 Other chronic pain: Secondary | ICD-10-CM

## 2020-01-31 DIAGNOSIS — R1011 Right upper quadrant pain: Secondary | ICD-10-CM

## 2020-01-31 DIAGNOSIS — D638 Anemia in other chronic diseases classified elsewhere: Secondary | ICD-10-CM | POA: Diagnosis present

## 2020-01-31 DIAGNOSIS — Z8719 Personal history of other diseases of the digestive system: Secondary | ICD-10-CM

## 2020-01-31 DIAGNOSIS — K921 Melena: Secondary | ICD-10-CM | POA: Diagnosis present

## 2020-01-31 DIAGNOSIS — Z6838 Body mass index (BMI) 38.0-38.9, adult: Secondary | ICD-10-CM

## 2020-01-31 DIAGNOSIS — K319 Disease of stomach and duodenum, unspecified: Secondary | ICD-10-CM | POA: Diagnosis present

## 2020-01-31 DIAGNOSIS — Z79899 Other long term (current) drug therapy: Secondary | ICD-10-CM

## 2020-01-31 DIAGNOSIS — N6321 Unspecified lump in the left breast, upper outer quadrant: Secondary | ICD-10-CM | POA: Insufficient documentation

## 2020-01-31 DIAGNOSIS — F430 Acute stress reaction: Secondary | ICD-10-CM | POA: Diagnosis present

## 2020-01-31 DIAGNOSIS — N200 Calculus of kidney: Secondary | ICD-10-CM | POA: Diagnosis present

## 2020-01-31 DIAGNOSIS — K30 Functional dyspepsia: Secondary | ICD-10-CM | POA: Diagnosis present

## 2020-01-31 LAB — COMPREHENSIVE METABOLIC PANEL
ALT: 19 U/L (ref 0–44)
AST: 16 U/L (ref 15–41)
Albumin: 3.6 g/dL (ref 3.5–5.0)
Alkaline Phosphatase: 75 U/L (ref 38–126)
Anion gap: 13 (ref 5–15)
BUN: 5 mg/dL — ABNORMAL LOW (ref 6–20)
CO2: 22 mmol/L (ref 22–32)
Calcium: 9.3 mg/dL (ref 8.9–10.3)
Chloride: 107 mmol/L (ref 98–111)
Creatinine, Ser: 0.51 mg/dL (ref 0.44–1.00)
GFR calc Af Amer: >60 mL/min (ref >60)
GFR calc non Af Amer: >60 mL/min (ref >60)
Glucose, Bld: 108 mg/dL — ABNORMAL HIGH (ref 70–99)
Potassium: 3.4 mmol/L — ABNORMAL LOW (ref 3.5–5.1)
Sodium: 142 mmol/L (ref 135–145)
Total Bilirubin: 0.6 mg/dL (ref 0.3–1.2)
Total Protein: 7.1 g/dL (ref 6.5–8.1)

## 2020-01-31 LAB — POC OCCULT BLOOD, ED
Fecal Occult Bld: NEGATIVE
Fecal Occult Bld: NEGATIVE

## 2020-01-31 LAB — CBC WITH DIFFERENTIAL/PLATELET
Abs Immature Granulocytes: 0.02 10*3/uL (ref 0.00–0.07)
Abs Immature Granulocytes: 0.03 10*3/uL (ref 0.00–0.07)
Basophils Absolute: 0.1 10*3/uL (ref 0.0–0.1)
Basophils Absolute: 0 10*3/uL (ref 0.0–0.1)
Basophils Relative: 1 %
Basophils Relative: 0 %
Eosinophils Absolute: 0.2 10*3/uL (ref 0.0–0.5)
Eosinophils Absolute: 0.3 10*3/uL (ref 0.0–0.5)
Eosinophils Relative: 2 %
Eosinophils Relative: 3 %
HCT: 37.5 % (ref 36.0–46.0)
HCT: 34.1 % — ABNORMAL LOW (ref 36.0–46.0)
Hemoglobin: 12.3 g/dL (ref 12.0–15.0)
Hemoglobin: 10.7 g/dL — ABNORMAL LOW (ref 12.0–15.0)
Immature Granulocytes: 0 %
Immature Granulocytes: 0 %
Lymphocytes Relative: 35 %
Lymphocytes Relative: 39 %
Lymphs Abs: 3.2 10*3/uL (ref 0.7–4.0)
Lymphs Abs: 3.6 10*3/uL (ref 0.7–4.0)
MCH: 30.1 pg (ref 26.0–34.0)
MCH: 29.9 pg (ref 26.0–34.0)
MCHC: 32.8 g/dL (ref 30.0–36.0)
MCHC: 31.4 g/dL (ref 30.0–36.0)
MCV: 91.7 fL (ref 80.0–100.0)
MCV: 95.3 fL (ref 80.0–100.0)
Monocytes Absolute: 0.5 10*3/uL (ref 0.1–1.0)
Monocytes Absolute: 0.7 10*3/uL (ref 0.1–1.0)
Monocytes Relative: 5 %
Monocytes Relative: 8 %
Neutro Abs: 5.3 10*3/uL (ref 1.7–7.7)
Neutro Abs: 4.5 10*3/uL (ref 1.7–7.7)
Neutrophils Relative %: 57 %
Neutrophils Relative %: 50 %
Platelets: 285 10*3/uL (ref 150–400)
Platelets: 262 10*3/uL (ref 150–400)
RBC: 4.09 MIL/uL (ref 3.87–5.11)
RBC: 3.58 MIL/uL — ABNORMAL LOW (ref 3.87–5.11)
RDW: 12.1 % (ref 11.5–15.5)
RDW: 12.3 % (ref 11.5–15.5)
WBC: 9.2 10*3/uL (ref 4.0–10.5)
WBC: 9.1 10*3/uL (ref 4.0–10.5)
nRBC: 0 % (ref 0.0–0.2)
nRBC: 0 % (ref 0.0–0.2)

## 2020-01-31 LAB — SARS CORONAVIRUS 2 BY RT PCR (HOSPITAL ORDER, PERFORMED IN ~~LOC~~ HOSPITAL LAB): SARS Coronavirus 2: NEGATIVE

## 2020-01-31 LAB — HIV ANTIBODY (ROUTINE TESTING W REFLEX): HIV Screen 4th Generation wRfx: NONREACTIVE

## 2020-01-31 LAB — I-STAT BETA HCG BLOOD, ED (MC, WL, AP ONLY): I-stat hCG, quantitative: <5 m[IU]/mL (ref <5)

## 2020-01-31 LAB — LIPASE, BLOOD: Lipase: 25 U/L (ref 11–51)

## 2020-01-31 LAB — HEMOGLOBIN AND HEMATOCRIT, BLOOD
HCT: 35.5 % — ABNORMAL LOW (ref 36.0–46.0)
Hemoglobin: 11.3 g/dL — ABNORMAL LOW (ref 12.0–15.0)

## 2020-01-31 MED ORDER — PANTOPRAZOLE SODIUM 40 MG IV SOLR
40.0000 mg | Freq: Two times a day (BID) | INTRAVENOUS | Status: DC
  Administered 2020-01-31 – 2020-02-01 (×4): 40 mg via INTRAVENOUS
  Filled 2020-01-31 (×4): qty 40

## 2020-01-31 MED ORDER — KETOROLAC TROMETHAMINE 15 MG/ML IJ SOLN
15.0000 mg | Freq: Once | INTRAMUSCULAR | Status: AC
  Administered 2020-01-31: 04:00:00 15 mg via INTRAVENOUS
  Filled 2020-01-31: qty 1

## 2020-01-31 MED ORDER — ALUM & MAG HYDROXIDE-SIMETH 200-200-20 MG/5ML PO SUSP
30.0000 mL | Freq: Once | ORAL | Status: DC
  Filled 2020-01-31: qty 30

## 2020-01-31 MED ORDER — PANTOPRAZOLE SODIUM 40 MG IV SOLR: 40.0000 mg | Freq: Every day | INTRAVENOUS | Status: DC

## 2020-01-31 MED ORDER — ONDANSETRON HCL 4 MG/2ML IJ SOLN
4.0000 mg | Freq: Once | INTRAMUSCULAR | Status: AC
  Administered 2020-01-31: 4 mg via INTRAVENOUS
  Filled 2020-01-31: qty 2

## 2020-01-31 MED ORDER — ACETAMINOPHEN 650 MG RE SUPP
650.0000 mg | Freq: Four times a day (QID) | RECTAL | Status: DC | PRN
  Filled 2020-01-31: qty 1

## 2020-01-31 MED ORDER — PROMETHAZINE HCL 25 MG/ML IJ SOLN
12.5000 mg | Freq: Once | INTRAMUSCULAR | Status: AC
  Administered 2020-01-31: 12.5 mg via INTRAVENOUS
  Filled 2020-01-31: qty 1

## 2020-01-31 MED ORDER — ONDANSETRON HCL 4 MG PO TABS: 4.0000 mg | ORAL_TABLET | Freq: Four times a day (QID) | ORAL | Status: DC | PRN

## 2020-01-31 MED ORDER — ONDANSETRON HCL 4 MG/2ML IJ SOLN: 4.0000 mg | Freq: Four times a day (QID) | INTRAMUSCULAR | Status: DC

## 2020-01-31 MED ORDER — SODIUM CHLORIDE 0.9 % IV BOLUS
1000.0000 mL | Freq: Once | INTRAVENOUS | Status: AC
  Administered 2020-01-31: 1000 mL via INTRAVENOUS

## 2020-01-31 MED ORDER — ACETAMINOPHEN 325 MG PO TABS
650.0000 mg | ORAL_TABLET | Freq: Four times a day (QID) | ORAL | Status: DC | PRN
  Administered 2020-01-31: 22:00:00 650 mg via ORAL
  Filled 2020-01-31 (×2): qty 2

## 2020-01-31 MED ORDER — ONDANSETRON HCL 4 MG/2ML IJ SOLN
4.0000 mg | Freq: Four times a day (QID) | INTRAMUSCULAR | Status: DC
  Administered 2020-01-31 – 2020-02-03 (×9): 4 mg via INTRAVENOUS
  Filled 2020-01-31 (×11): qty 2

## 2020-01-31 MED ORDER — ONDANSETRON HCL 4 MG PO TABS: 4.0000 mg | ORAL_TABLET | Freq: Four times a day (QID) | ORAL | Status: DC

## 2020-01-31 MED ORDER — ONDANSETRON HCL 4 MG/2ML IJ SOLN: 4.0000 mg | Freq: Four times a day (QID) | INTRAMUSCULAR | Status: DC | PRN

## 2020-01-31 MED ORDER — POTASSIUM CHLORIDE CRYS ER 20 MEQ PO TBCR
40.0000 meq | EXTENDED_RELEASE_TABLET | Freq: Once | ORAL | Status: AC
  Administered 2020-01-31: 40 meq via ORAL
  Filled 2020-01-31: qty 2

## 2020-01-31 MED ORDER — PROMETHAZINE HCL 25 MG PO TABS: 25.0000 mg | ORAL_TABLET | Freq: Four times a day (QID) | ORAL | 0 refills | Status: DC | PRN

## 2020-01-31 MED ORDER — POTASSIUM CHLORIDE CRYS ER 20 MEQ PO TBCR: 20.0000 meq | EXTENDED_RELEASE_TABLET | Freq: Every day | ORAL | 0 refills | Status: DC

## 2020-01-31 MED ORDER — LIDOCAINE VISCOUS HCL 2 % MT SOLN
15.0000 mL | Freq: Once | OROMUCOSAL | Status: DC
  Filled 2020-01-31: qty 15

## 2020-01-31 MED ORDER — MELOXICAM 15 MG PO TABS: 15.0000 mg | ORAL_TABLET | Freq: Every day | ORAL | 0 refills | Status: DC | PRN

## 2020-01-31 MED ORDER — SODIUM CHLORIDE 0.9 % IV BOLUS
250.0000 mL | Freq: Once | INTRAVENOUS | Status: AC
  Administered 2020-01-31: 250 mL via INTRAVENOUS

## 2020-01-31 MED ORDER — FENTANYL CITRATE (PF) 100 MCG/2ML IJ SOLN
50.0000 ug | Freq: Once | INTRAMUSCULAR | Status: AC
  Administered 2020-01-31: 50 ug via INTRAVENOUS
  Filled 2020-01-31: qty 2

## 2020-01-31 MED ORDER — DEXTROSE-NACL 5-0.45 % IV SOLN: INTRAVENOUS | Status: DC

## 2020-01-31 MED ORDER — ONDANSETRON HCL 4 MG PO TABS
4.0000 mg | ORAL_TABLET | Freq: Four times a day (QID) | ORAL | Status: DC
  Administered 2020-02-02 – 2020-02-03 (×2): 4 mg via ORAL
  Filled 2020-01-31 (×3): qty 1

## 2020-01-31 NOTE — H&P (View-Only) (Signed)
Consultation  Referring Provider: Internal medicine service/Emily Criselda Peaches, MD  primary Care Physician:  Patient, No Pcp Per Primary Gastroenterologist:  None/unassigned.  Reason for Consultation: Nausea vomiting and possible hematemesis  HPI: Felicia Sutton is a 38 y.o. female, who was seen in the emergency room yesterday with complaints of nausea and vomiting and was treated and discharged home.  She returned to the ER this morning after she had an episode of hematemesis at home.  She also mentioned that she thinks she saw some blood in her stool yesterday but that had not recurred. No recent abdominal imaging . Patient says her current symptoms started about 2 weeks ago with nausea and vomiting and have been persistent ever since.  She is fairly continuously nauseated and vomiting on a daily basis.  Interestingly she has not had any associated abdominal pain, no diarrhea, no melena or hematochezia and no fever or chills.  She denies any heartburn dysphagia or odynophagia. She relates history of GERD and stays on Nexium 20 mg p.o. chronically.  She had prior GI work-up through [facility] in [place] in 2018 and apparently underwent upper endoscopy which was negative.  She has not seen them in the past few years. She has not had any prior similar episodes of nausea and vomiting.  She did have her gallbladder removed in May 2020 at [facility]. She denies any regular aspirin or NSAID use, no recent new medications, denies any EtOH or cannabis use.  No known infectious exposures. She was given Zofran ODT at prior ER visits but says this has not been helpful with the nausea and vomiting.  Review of labs shows CBC 01/09/2020 normal with hemoglobin of 12.7 At 2 AM today WBC 9.2, hemoglobin 12.3/hematocrit of 37.5, chemistries normal with exception of potassium at 3.4, LFTs within normal limits Beta hCG negative lipase within normal limits, stool documented Hemoccult negative.  Repeat CBC this  morning hemoglobin 10.7 hematocrit of 34.1 COVID-19 negative    Today's labs, potassium 3.4, LFTs within normal limits CBC normal, hemoglobin 12.3/hematocrit 37.5 -repeat hemoglobin 8 hours later hemoglobin 10.7 hematocrit of 34.1 after hydration Stool Hemoccult negative  History reviewed. No pertinent past medical history.  History reviewed. No pertinent surgical history.  Prior to Admission medications   Medication Sig Start Date End Date Taking? Authorizing Provider  potassium chloride SA (KLOR-CON) 20 MEQ tablet Take 1 tablet (20 mEq total) by mouth daily. 01/31/20   Petrucelli, Pleas Koch, PA-C  promethazine (PHENERGAN) 25 MG tablet Take 1 tablet (25 mg total) by mouth every 6 (six) hours as needed for nausea or vomiting. 01/31/20   Petrucelli, Samantha R, PA-C  famotidine (PEPCID) 20 MG tablet Take 1 tablet (20 mg total) by mouth 2 (two) times daily. Patient not taking: Reported on 01/10/2020 01/02/20 01/31/20  Tilden Fossa, MD  pantoprazole (PROTONIX) 20 MG tablet Take 1 tablet (20 mg total) by mouth daily. Patient not taking: Reported on 01/10/2020 01/02/20 01/31/20  Tilden Fossa, MD    Current Facility-Administered Medications  Medication Dose Route Frequency Provider Last Rate Last Admin  . acetaminophen (TYLENOL) tablet 650 mg  650 mg Oral Q6H PRN Christian, Rylee, MD       Or  . acetaminophen (TYLENOL) suppository 650 mg  650 mg Rectal Q6H PRN Christian, Rylee, MD      . ondansetron (ZOFRAN) tablet 4 mg  4 mg Oral Q6H PRN Christian, Rylee, MD       Or  . ondansetron (ZOFRAN) injection 4 mg  4 mg Intravenous Q6H PRN Christian, Rylee, MD      . pantoprazole (PROTONIX) injection 40 mg  40 mg Intravenous Q12H Claudean Severance, MD   40 mg at 01/31/20 1412   Current Outpatient Medications  Medication Sig Dispense Refill  . potassium chloride SA (KLOR-CON) 20 MEQ tablet Take 1 tablet (20 mEq total) by mouth daily. 3 tablet 0  . promethazine (PHENERGAN) 25 MG tablet Take 1  tablet (25 mg total) by mouth every 6 (six) hours as needed for nausea or vomiting. 10 tablet 0    Allergies as of 01/31/2020  . (No Known Allergies)    History reviewed. No pertinent family history.  Social History   Socioeconomic History  . Marital status: Single    Spouse name: Not on file  . Number of children: Not on file  . Years of education: Not on file  . Highest education level: Not on file  Occupational History  . Not on file  Tobacco Use  . Smoking status: Never Smoker  . Smokeless tobacco: Never Used  Substance and Sexual Activity  . Alcohol use: Never  . Drug use: Never  . Sexual activity: Never  Other Topics Concern  . Not on file  Social History Narrative  . Not on file   Social Determinants of Health   Financial Resource Strain:   . Difficulty of Paying Living Expenses:   Food Insecurity:   . Worried About Programme researcher, broadcasting/film/video in the Last Year:   . Barista in the Last Year:   Transportation Needs:   . Freight forwarder (Medical):   Marland Kitchen Lack of Transportation (Non-Medical):   Physical Activity:   . Days of Exercise per Week:   . Minutes of Exercise per Session:   Stress:   . Feeling of Stress :   Social Connections:   . Frequency of Communication with Friends and Family:   . Frequency of Social Gatherings with Friends and Family:   . Attends Religious Services:   . Active Member of Clubs or Organizations:   . Attends Banker Meetings:   Marland Kitchen Marital Status:   Intimate Partner Violence:   . Fear of Current or Ex-Partner:   . Emotionally Abused:   Marland Kitchen Physically Abused:   . Sexually Abused:     Review of Systems: Pertinent positive and negative review of systems were noted in the above HPI section.  All other review of systems was otherwise negative. Physical Exam: Vital signs in last 24 hours: Temp:  [97.8 F (36.6 C)-98.3 F (36.8 C)] 98 F (36.7 C) (05/12 1343) Pulse Rate:  [70-105] 97 (05/12 1343) Resp:  [14-20]  17 (05/12 1343) BP: (107-141)/(62-89) 132/89 (05/12 1343) SpO2:  [95 %-100 %] 100 % (05/12 1343) Weight:  [100.2 kg] 100.2 kg (05/12 1413)   General:   Alert,  Well-developed, well-nourished, white female, pleasant and cooperative in NAD Head:  Normocephalic and atraumatic. Eyes:  Sclera clear, no icterus.   Conjunctiva pink. Ears:  Normal auditory acuity. Nose:  No deformity, discharge,  or lesions. Mouth:  No deformity or lesions.   Neck:  Supple; no masses or thyromegaly. Lungs:  Clear throughout to auscultation.   No wheezes, crackles, or rhonchi.-Exam of chest shows some erythema of the left breast around the areola, no firmness or tenderness.  She points to her mid chest just to the left of center where she feels the knot, she is tender but I do not appreciate  a definite mass or nodule.   Heart:  Regular rate and rhythm; no murmurs, clicks, rubs,  or gallops. Abdomen:  Soft,nontender, BS active,nonpalp mass or hsm.   Rectal:  Deferred -documented Hemoccult negative Msk:  Symmetrical without gross deformities. . Pulses:  Normal pulses noted. Extremities:  Without clubbing or edema. Neurologic:  Alert and  oriented x4;  grossly normal neurologically. Skin:  Intact without significant lesions or rashes.. Psych:  Alert and cooperative. Normal mood and affect.  Intake/Output from previous day: No intake/output data recorded. Intake/Output this shift: Total I/O In: 250 [IV Piggyback:250] Out: -   Lab Results: Recent Labs    01/31/20 0219 01/31/20 0934  WBC 9.2 9.1  HGB 12.3 10.7*  HCT 37.5 34.1*  PLT 285 262   BMET Recent Labs    01/31/20 0219  NA 142  K 3.4*  CL 107  CO2 22  GLUCOSE 108*  BUN 5*  CREATININE 0.51  CALCIUM 9.3   LFT Recent Labs    01/31/20 0219  PROT 7.1  ALBUMIN 3.6  AST 16  ALT 19  ALKPHOS 75  BILITOT 0.6   PT/INR No results for input(s): LABPROT, INR in the last 72 hours. Hepatitis Panel No results for input(s): HEPBSAG, HCVAB,  HEPAIGM, HEPBIGM in the last 72 hours.    IMPRESSION:  #22 39 year old white female with 2-week history of intractable nausea and vomiting with repeated ER visits.  No real abdominal pain. Today patient had one episode of small-volume hematemesis with blood streaking of emesis.  I do not think she is having an active GI bleed, she may have some retch gastropathy, or esophagitis secondary to repeated vomiting.  Etiology of persistent nausea and vomiting is not clear.  Patient is status post cholecystectomy, and labs fairly unremarkable. Rule out viral gastroenteritis, rule out possible peptic ulcer disease, gastritis, gastroparesis.  #2 status post cholecystectomy 2020 #3 history of chronic GERD on chronic Nexium 20 mg p.o. daily at home #4 left chest wall/breast nodule  Plan; continue IV fluids at 100 cc/h. IV PPI twice daily Have change Zofran to 4 mg every 6 hours around-the-clock rather than as needed Serial hemoglobins every 6 hours until tomorrow.  Transfuse for hemoglobin 7 or less. Clear liquids this evening, will make her n.p.o. after midnight. Tentatively have scheduled for EGD with Dr. Myrtie Neither tomorrow 02/01/2020. If EGD is unrevealing will need CT of the abdomen and pelvis    Amy Esterwood PA-C 01/31/2020, 3:12 PM  I have reviewed the entire case in detail with the above APP and discussed the plan in detail.  Therefore, I agree with the diagnoses recorded above. In addition,  I have personally interviewed and examined the patient and have personally reviewed any abdominal/pelvic CT scan images.  My additional thoughts are as follows:  Recent exacerbation of chronic right upper quadrant abdominal pain with reported vomiting and possible hematemesis at home.  Chart review indicates she has been seen for abdominal pain and vomiting over many months at multiple outlying healthcare institutions.  She appears to been admitted on this occasion because of the reported hematemesis.  Her  hemoglobin is decreased some from recent baseline, but she does not to have ongoing GI bleeding, much less brisk bleeding, at this point.  This patient says she had pain even before her cholecystectomy last year.  She reportedly had upper endoscopy with GI practice in the [facility] system somewhere in the last couple of years.  She takes Nexium for "reflux", though symptoms somewhat  difficult to discern.  She was totally comfortable watching television when I enter the room, has a somewhat restricted affect.  Says she is in fairly constant pain, points her right upper quadrant.  No discernible change in her demeanor or any reaction to palpation, though she says it is tender.  It is not clear to me this patient has an organic source for abdominal pain and vomiting.  An episode in outlying hospital months ago indicates it also occurred in the setting of acute stress reaction.  Patient was on Valium and an SSRI at that time, but no longer on them.  Suspect there may be a supratentorial component to symptoms. CT scan was done at least 1 of these occasions at an outlying institution, unrevealing.  I do not think a repeat CT scan is to be done at this time.   Since she reported hematemesis, we are agreeable to an upper endoscopy tomorrow.  If negative and no change in condition, she can most likely be discharged home afterwards.   Nelida Meuse III [phone]

## 2020-01-31 NOTE — ED Notes (Signed)
Discharge instructions discussed with pt. Pt verbalized understanding. Pt stable and ambulatory. No signature pad available. 

## 2020-01-31 NOTE — Hospital Course (Signed)
Admitted 01/31/2020  Allergies: Patient has no known allergies. Pertinent Hx: Breast mass  39 y.o. female p/w hematemesis  * Hematemesis: ?Gastroenteritis, discharged earlier today with phernegan. Had an episode of hematemasis and blood in her stool. Hgb dropped from 12 to 10 from this morning. FOBT pending. No gross blood on exam. VSS. Given fluids, consulted GI.   Consults: GI  Meds: PPI VTE ppx: SCDS IVF: NS Diet: NPO

## 2020-01-31 NOTE — Discharge Instructions (Signed)
You were seen in the ER today for vomiting and a painful breast mass.  Your labs were overall reassuring, your potassium was somewhat low therefore we have attached some diet guidelines and are sending you home with a potassium supplement to take for a few days.  We are also sending you home with the following medicines to treat your symptoms:  - Meloxicam- this is a nonsteroidal anti-inflammatory medication that will help with pain and swelling. Be sure to take this medication as prescribed with food, 1 pill every 24 hours,  It should be taken with food, as it can cause stomach upset, and more seriously, stomach bleeding. Do not take other nonsteroidal anti-inflammatory medications with this such as Naproxen, naprosyn, Advil, Motrin, Aleve, Mobic, Goodie Powder, or Motrin.    - Phenergan- this is a medicine to help with nausea/vomiting, take every 6 hours as needed.  You make take Tylenol per over the counter dosing with these medications.   We have prescribed you new medication(s) today. Discuss the medications prescribed today with your pharmacist as they can have adverse effects and interactions with your other medicines including over the counter and prescribed medications. Seek medical evaluation if you start to experience new or abnormal symptoms after taking one of these medicines, seek care immediately if you start to experience difficulty breathing, feeling of your throat closing, facial swelling, or rash as these could be indications of a more serious allergic reaction  We have placed a referral to our breast clinic, please follow up within 1 week, also follow up with your primary care provider. Return to the ER for new or worsening symptoms including but not limited to increased pain, inability to keep fluids down, blood in vomit or stool, fever, skin changes of the breast, or any other concerns.

## 2020-01-31 NOTE — ED Triage Notes (Signed)
Pt c/o abdominal pain, nausea and vomiting x 1 day.

## 2020-01-31 NOTE — ED Notes (Signed)
Walked patient to the bathroom patient did well 

## 2020-01-31 NOTE — ED Provider Notes (Signed)
[facility] EMERGENCY DEPARTMENT Provider Note   CSN: [id number] Arrival date & time: 01/31/20  7829     History Chief Complaint  Patient presents with  . Hemoptysis    Felicia Sutton is a 39 y.o. female presents today for concern of hematemesis.  Patient reports she was discharged early this morning around 4-5 AM.  She returned home at 630 she had 1 episode of hematemesis which has not recurred.  She reports that at 630 she took her Phenergan and has not vomited since that time.  She denies any abdominal pain reports she is otherwise feeling well.  Additionally reports that she thinks she saw some blood in her stool yesterday which has not reoccurred.  Additionally patient reports that she had a knot evaluated on her breast last night and is supposed to receive a call for evaluation at the breast clinic later on today.  No change to her breast knot since discharge.  Denies fever/chills, headache, chest pain/shortness of breath, abdominal pain, dysuria/hematuria, numbness/weakness, tingling or any additional concerns. HPI     History reviewed. No pertinent past medical history.  Patient Active Problem List   Diagnosis Date Noted  . Hematemesis 01/31/2020    History reviewed. No pertinent surgical history.   OB History   No obstetric history on file.     History reviewed. No pertinent family history.  Social History   Tobacco Use  . Smoking status: Never Smoker  . Smokeless tobacco: Never Used  Substance Use Topics  . Alcohol use: Never  . Drug use: Never    Home Medications Prior to Admission medications   Medication Sig Start Date End Date Taking? Authorizing Provider  potassium chloride SA (KLOR-CON) 20 MEQ tablet Take 1 tablet (20 mEq total) by mouth daily. 01/31/20   Petrucelli, Pleas Koch, PA-C  promethazine (PHENERGAN) 25 MG tablet Take 1 tablet (25 mg total) by mouth every 6 (six) hours as needed for nausea or vomiting. 01/31/20    Petrucelli, Samantha R, PA-C  famotidine (PEPCID) 20 MG tablet Take 1 tablet (20 mg total) by mouth 2 (two) times daily. Patient not taking: Reported on 01/10/2020 01/02/20 01/31/20  Tilden Fossa, MD  pantoprazole (PROTONIX) 20 MG tablet Take 1 tablet (20 mg total) by mouth daily. Patient not taking: Reported on 01/10/2020 01/02/20 01/31/20  Tilden Fossa, MD    Allergies    Patient has no known allergies.  Review of Systems   Review of Systems Ten systems are reviewed and are negative for acute change except as noted in the HPI  Physical Exam Updated Vital Signs BP 121/82   Pulse 91   Temp 97.8 F (36.6 C) (Oral)   Resp 18   LMP 01/10/2020   SpO2 100%   Physical Exam Constitutional:      General: She is not in acute distress.    Appearance: Normal appearance. She is well-developed. She is not ill-appearing or diaphoretic.  HENT:     Head: Normocephalic and atraumatic.     Right Ear: External ear normal.     Left Ear: External ear normal.     Nose: Nose normal.  Eyes:     General: Vision grossly intact. Gaze aligned appropriately.     Pupils: Pupils are equal, round, and reactive to light.  Neck:     Trachea: Trachea and phonation normal. No tracheal deviation.  Pulmonary:     Effort: Pulmonary effort is normal. No respiratory distress.  Abdominal:     General:  There is no distension.     Palpations: Abdomen is soft.     Tenderness: There is no abdominal tenderness. There is no guarding or rebound.  Genitourinary:    Comments: Rectal examination chaperoned by [facility]  Exam shows small nonthrombosed external hemorrhoid.  No palpable internal hemorrhoids fissures or masses.  Light brown stool present.  Normal rectal tone. Musculoskeletal:        General: Normal range of motion.     Cervical back: Normal range of motion.  Skin:    General: Skin is warm and dry.  Neurological:     Mental Status: She is alert.     GCS: GCS eye subscore is 4. GCS verbal subscore is 5.  GCS motor subscore is 6.     Comments: Speech is clear and goal oriented, follows commands Major Cranial nerves without deficit, no facial droop Moves extremities without ataxia, coordination intact  Psychiatric:        Behavior: Behavior normal.     ED Results / Procedures / Treatments   Labs (all labs ordered are listed, but only abnormal results are displayed) Labs Reviewed  CBC WITH DIFFERENTIAL/PLATELET - Abnormal; Notable for the following components:      Result Value   RBC 3.58 (*)    Hemoglobin 10.7 (*)    HCT 34.1 (*)    All other components within normal limits  SARS CORONAVIRUS 2 BY RT PCR (HOSPITAL ORDER, PERFORMED IN [facility])  LIPASE, BLOOD  HIV ANTIBODY (ROUTINE TESTING W REFLEX)  POC OCCULT BLOOD, ED  POC OCCULT BLOOD, ED    EKG None  Radiology No results found.  Procedures Procedures (including critical care time)  Medications Ordered in ED Medications  acetaminophen (TYLENOL) tablet 650 mg (has no administration in time range)    Or  acetaminophen (TYLENOL) suppository 650 mg (has no administration in time range)  ondansetron (ZOFRAN) tablet 4 mg (has no administration in time range)    Or  ondansetron (ZOFRAN) injection 4 mg (has no administration in time range)  pantoprazole (PROTONIX) injection 40 mg (has no administration in time range)  sodium chloride 0.9 % bolus 250 mL (0 mLs Intravenous Stopped 01/31/20 1130)    ED Course  I have reviewed the triage vital signs and the nursing notes.  Pertinent labs & imaging results that were available during my care of the patient were reviewed by me and considered in my medical decision making (see chart for details).  Clinical Course as of Jan 31 1332  Wed Jan 31, 2020  [place] RN   [BM]  1217 Dr. Gwyneth Revels;    [BM]  1323 Clarita Leber   [BM]    Clinical Course User Index [BM] Elizabeth Palau   MDM Rules/Calculators/A&P                     Additional History  Obtained: Nursing notes from this visit. Prior ED visit earlier today labs reviewed show CMP mild hypokalemia, CBC within normal limits, pregnancy test negative.  Patient received potassium supplementation, Phenergan, Toradol, 1 L IV fluid, fentanyl and Zofran.  She was discharged with Phenergan, meloxicam, potassium segments.   ED Course: Patient arrives well-appearing no acute distress normal vital signs.  She has not had any additional vomiting since her one episode of hematemesis at 6:30 AM.  Will obtain repeat CBC to assess for possible anemia or leukocytosis.  Additionally will add on lipase and Hemoccult.  Patient does not  have any symptoms suggestive of anemia.  Small fluid bolus 250 mL ordered.  Will monitor patient. - I have ordered, reviewed and interpreted the following labs.  CBC shows new anemia of 10.7 with hematocrit 3.41, hemoglobin down nearly 2 points since last night.  Hemoccult was negative and lipase within normal limits no evidence of pancreatitis.  Possibility that decreased hemoglobin may be secondary to IV fluids given last night however patient will need admission for observation and repeat CBCs.  On reevaluation patient is agreeable for admission she has no unaddressed concerns denies any pain reports she is feeling well vital signs are stable. - 12:17 PM: Discussed case with internal medicine Dr. Sherry Ruffing who is asked that I consult gastroenterology, patient has been accepted to internal medicine service. - 1:23 PM: Discussed case with gastroenterology [facility] who will see patient in consult.   Case discussed with Dr. Rogene Houston who agrees with plan.  Note: Portions of this report may have been transcribed using voice recognition software. Every effort was made to ensure accuracy; however, inadvertent computerized transcription errors may still be present. Final Clinical Impression(s) / ED Diagnoses Final diagnoses:  Hematemesis with nausea    Rx / DC Orders ED  Discharge Orders    None       Gari Crown 01/31/20 1333    Fredia Sorrow, MD 02/03/20 [phone]

## 2020-01-31 NOTE — H&P (Signed)
Date: 01/31/2020               Patient Name:  Felicia Sutton MRN: [id number]  DOB: June 28, 1981 Age / Sex: 39 y.o., female   PCP: Patient, No Pcp Per              Medical Service: Internal Medicine Teaching Service              Attending Physician: Dr. Inez Catalina, MD    First Contact: Sol Blazing, MS3 Pager: [phone]  Second Contact: Dr. Elige Radon Pager: [phone]  Third Contact Dr. Hermine Messick Pager: [phone]       After Hours (After 5p/  First Contact Pager: [phone]  weekends / holidays): Second Contact Pager: [phone]   Chief Complaint: Hematemesis  History of Present Illness:   Felicia Sutton is a 39 year old female with a past medical history significant for GERD and frequent ED visits over the past year for abd pain, chest pain, n/v.  Presented to the Emergency Department today for an episode of hematemesis. Patient was seen in the ED early this morning for nausea and vomiting, and was discharged home around 4-5 am this morning. Upon returning home, patient reports that she had an isolated event of hematemesis. Patient describes the hematemesis as "dark" and approximates the volume to one cup. She not had an episode of vomiting or hematemesis since arriving to the ED. Patient denies any sick contacts, recent travel, fever, chills, stomach pain, chest pain, throat pain, diarrhea, hematochezia, melena, or  recent alcohol use. She states that she has never experienced anything similar to this before.  She correlates the nausea and vomiting to a breast mass that she noticed 1 week prior. She began experiencing nausea and vomiting 1 day ago without hematemesis. She states that the nausea is constant, and not aggravated or alleviated positioning. She states that the episodes of vomiting occur directly after she eats. The breast mass is located on the upper midline of the left breast (3rd intercostal space). Mass is firm and mobile and tender on palpation. Patient denies  changes in coloration, texture, or appearance to the breast. She denies any nipple discharge.  As stated above, pt is in the ED quite often. On chart review, including her Care Everywhere records, it appears that she has been seen in various ED's 15x since January--majority over the past 70mo. Primary complaints are abd pain, chest pain, and flank pain.  From 01/2019-01/2020, she has underwent 6 abdomen/pelvis CTs and 3 pelvic ultrasounds. Non-obstructive nephrolithiasis is the only significant finding present on a couple of these scans.  Had an EGD in September 2018 at [facility] that revealed chronic gastritis. Patient reported that she had an EGD for "reflux" in 2002 in [place].  ED Course: On initial presentation, CMP revealed hypokalemia. Oral potassium given in ED. Was given phenergan and toradol and seemed to be improving and was discharged. On readmission, CBC revealed Hgb of 10.7 down from 12.3 on initial presentation. Fecal occult blood test was negative and lipase within normal limits. GI ([facility]) consulted in ED.   Outpatient Meds:  - Nexium    Allergies: Allergies as of 01/31/2020  . (No Known Allergies)   History reviewed. No pertinent past medical history.  Family History: No family history of diabetes, cardiovascular disease, or cancer.  Social History: Patient lives alone. She states that her soon recently passed in January 2021 secondary to COVID-19. Patient denies alcohol use. States she no longer smokes cigarettes; smoked 1  pack per day from age of 62 to 4 years old. Patient denies any illegal or illicit substance use.  Review of Systems: A complete ROS was negative except as per HPI.   Physical Exam: Blood pressure 121/82, pulse 91, temperature 97.8 F (36.6 C), temperature source Oral, resp. rate 18, last menstrual period 01/10/2020, SpO2 100 %. Constitutional: Normal appearance. She is well-developed. She is not ill-appearing or diaphoretic. In no  apparent distress. Head: Normocephalic and atraumatic. Eyes: Extra ocular movements in tact.  Pulmonary: Normal respiratory effort. Lungs clear to ascultation throughout.  Abdominal: There is no distension. Abdomen is soft. There is no abdominal tenderness. There is no guarding or rebound.  Skin: Skin is warm and dry. Firm, mobile, tender mass upper midline of left breast. No erythema or increased warmth over mass. Psychiatric: Mood and affect appropriate.   EKG: personally reviewed my interpretation is normal sinus rhythm.  Assessment & Plan by Problem: Active Problems:   Hematemesis  # Acute Nausea/Vomiting # Hematemesis # Anemia Patient presented to the ED with nausea vomiting. Was improving, and discharged this morning with phenergan and meloxicam for supportive management. Patient had an isolated episode of hematemesis upon returning home. Patient came back to the ED. She describes the hematemesis as "dark" and equates the volume to that of a cup. She has not had another episode of vomiting or hematemesis since returning to the ED. Patient denies any sick contacts, recent travel, fever, chills, stomach pain, chest pain, throat pain, diarrhea, hematochezia, melena, or  recent alcohol use. She states that she has never experienced anything similar to this before. Per chart review, patient has been treated in the ED for nausea/vomiting, chest pain, and RUQ pain in the past. Assessment: Patient had one episode of hematemesis following multiple episodes of vomiting without hematemesis. Nausea vomiting could be associated with chronic gastritis. Patient's clinical presentation is not overtly concerning for emergent pathological processes. Combined with history of GERD, this episode of acute hematemesis is likely acute esophagitis from the multiple episodes of vomiting she has had in the past day. Gastric/duodenal ulcers must be considered in the setting of upper GI bleed, but are unlikely given lack  of epigastric pain, no epigastric pain, no prolonged NSAID use, or history of symptomatic H. Pylori infection. Iatrogenic bleeding from history of multiple EGDs could be considered, but very unlikely. Plan: - Refer to GI. GI consulted in ED. - CBC to monitor anemia. Likely due to fluid repletion in ED. - Zofran 4mg , oral. Every 6 hours, PRN. - Pantoprazole 40 mg IV. Every 12 hours. - Tylenol 650 mg, oral. Every 6 hours, PRN. - Continue IV fluids at 100cc per hour.  # Hypokalemia CMP revealed potassium level of 3.4. Patient given potassium chloride in ED. Plan: - Repeat CMP to ensure potassium level restored to normal.   CODE Status: FULL DVT prophylaxis: SCDs Diet: Refer to GI  Dispo: Admit patient to Observation with expected length of stay less than 2 midnights.  Signed: Nanetta Batty, Medical Student 01/31/2020, 1:37 PM   Resident Attestation: I was present during the admission HPI and personally evaluated this pt.  39 yo female with PMH of chronic gastritis and severe depression who presented for hematemesis. Due to a slight drop in hgb, patient was admitted to IMTS for further evaluation.  As stated in medical student's note, she has frequented the ED several times over the past 56mo resulting in numerous CT scans and other testing, all of which have been essentially unrevealing (  aside from nephrolithiasis). Overall seems that there is likely an underlying factor to this pt's repeat ED visits. On chart review, it is clear that she has a fairly significant and recent psychiatric history. Although I do agree that a thorough GI evaluation is appropriate for the hematemesis, I do think that she will benefit from close outpatient follow up with psych.   Plan: IV protonix bid, scheduled zofran, npo after mn in anticipation of EGD in am. Will continue to monitor hgb level. Suspect she will be able to discharge home tomorrow following EGD if uncomplicated and hgb remains stable.  Elige Radon, MD 01/31/20  5:45 PM

## 2020-01-31 NOTE — ED Triage Notes (Signed)
Patient arrives to ED with complaints of x1 episode of bloody emesis after she was discharged from the ED earlier toady. Patient denies chest pain, SOB, and abdominal pain. Patient states she has a "knot" on her breast that is painful.

## 2020-01-31 NOTE — Consult Note (Addendum)
  Consultation  Referring Provider: Internal medicine service/Emily Mullen, MD  primary Care Physician:  Patient, No Pcp Per Primary Gastroenterologist:  None/unassigned.  Reason for Consultation: Nausea vomiting and possible hematemesis  HPI: Felicia Sutton is a 39 y.o. female, who was seen in the emergency room yesterday with complaints of nausea and vomiting and was treated and discharged home.  She returned to the ER this morning after she had an episode of hematemesis at home.  She also mentioned that she thinks she saw some blood in her stool yesterday but that had not recurred. No recent abdominal imaging . Patient says her current symptoms started about 2 weeks ago with nausea and vomiting and have been persistent ever since.  She is fairly continuously nauseated and vomiting on a daily basis.  Interestingly she has not had any associated abdominal pain, no diarrhea, no melena or hematochezia and no fever or chills.  She denies any heartburn dysphagia or odynophagia. She relates history of GERD and stays on Nexium 20 mg p.o. chronically.  She had prior GI work-up through [facility] in [place] in 2018 and apparently underwent upper endoscopy which was negative.  She has not seen them in the past few years. She has not had any prior similar episodes of nausea and vomiting.  She did have her gallbladder removed in May 2020 at [facility]. She denies any regular aspirin or NSAID use, no recent new medications, denies any EtOH or cannabis use.  No known infectious exposures. She was given Zofran ODT at prior ER visits but says this has not been helpful with the nausea and vomiting.  Review of labs shows CBC 01/09/2020 normal with hemoglobin of 12.7 At 2 AM today WBC 9.2, hemoglobin 12.3/hematocrit of 37.5, chemistries normal with exception of potassium at 3.4, LFTs within normal limits Beta hCG negative lipase within normal limits, stool documented Hemoccult negative.  Repeat CBC this  morning hemoglobin 10.7 hematocrit of 34.1 COVID-19 negative    Today's labs, potassium 3.4, LFTs within normal limits CBC normal, hemoglobin 12.3/hematocrit 37.5 -repeat hemoglobin 8 hours later hemoglobin 10.7 hematocrit of 34.1 after hydration Stool Hemoccult negative  History reviewed. No pertinent past medical history.  History reviewed. No pertinent surgical history.  Prior to Admission medications   Medication Sig Start Date End Date Taking? Authorizing Provider  potassium chloride SA (KLOR-CON) 20 MEQ tablet Take 1 tablet (20 mEq total) by mouth daily. 01/31/20   Petrucelli, Samantha R, PA-C  promethazine (PHENERGAN) 25 MG tablet Take 1 tablet (25 mg total) by mouth every 6 (six) hours as needed for nausea or vomiting. 01/31/20   Petrucelli, Samantha R, PA-C  famotidine (PEPCID) 20 MG tablet Take 1 tablet (20 mg total) by mouth 2 (two) times daily. Patient not taking: Reported on 01/10/2020 01/02/20 01/31/20  Rees, Elizabeth, MD  pantoprazole (PROTONIX) 20 MG tablet Take 1 tablet (20 mg total) by mouth daily. Patient not taking: Reported on 01/10/2020 01/02/20 01/31/20  Rees, Elizabeth, MD    Current Facility-Administered Medications  Medication Dose Route Frequency Provider Last Rate Last Admin  . acetaminophen (TYLENOL) tablet 650 mg  650 mg Oral Q6H PRN Christian, Rylee, MD       Or  . acetaminophen (TYLENOL) suppository 650 mg  650 mg Rectal Q6H PRN Christian, Rylee, MD      . ondansetron (ZOFRAN) tablet 4 mg  4 mg Oral Q6H PRN Christian, Rylee, MD       Or  . ondansetron (ZOFRAN) injection 4 mg    4 mg Intravenous Q6H PRN Christian, Rylee, MD      . pantoprazole (PROTONIX) injection 40 mg  40 mg Intravenous Q12H Claudean Severance, MD   40 mg at 01/31/20 1412   Current Outpatient Medications  Medication Sig Dispense Refill  . potassium chloride SA (KLOR-CON) 20 MEQ tablet Take 1 tablet (20 mEq total) by mouth daily. 3 tablet 0  . promethazine (PHENERGAN) 25 MG tablet Take 1  tablet (25 mg total) by mouth every 6 (six) hours as needed for nausea or vomiting. 10 tablet 0    Allergies as of 01/31/2020  . (No Known Allergies)    History reviewed. No pertinent family history.  Social History   Socioeconomic History  . Marital status: Single    Spouse name: Not on file  . Number of children: Not on file  . Years of education: Not on file  . Highest education level: Not on file  Occupational History  . Not on file  Tobacco Use  . Smoking status: Never Smoker  . Smokeless tobacco: Never Used  Substance and Sexual Activity  . Alcohol use: Never  . Drug use: Never  . Sexual activity: Never  Other Topics Concern  . Not on file  Social History Narrative  . Not on file   Social Determinants of Health   Financial Resource Strain:   . Difficulty of Paying Living Expenses:   Food Insecurity:   . Worried About Programme researcher, broadcasting/film/video in the Last Year:   . Barista in the Last Year:   Transportation Needs:   . Freight forwarder (Medical):   Marland Kitchen Lack of Transportation (Non-Medical):   Physical Activity:   . Days of Exercise per Week:   . Minutes of Exercise per Session:   Stress:   . Feeling of Stress :   Social Connections:   . Frequency of Communication with Friends and Family:   . Frequency of Social Gatherings with Friends and Family:   . Attends Religious Services:   . Active Member of Clubs or Organizations:   . Attends Banker Meetings:   Marland Kitchen Marital Status:   Intimate Partner Violence:   . Fear of Current or Ex-Partner:   . Emotionally Abused:   Marland Kitchen Physically Abused:   . Sexually Abused:     Review of Systems: Pertinent positive and negative review of systems were noted in the above HPI section.  All other review of systems was otherwise negative. Physical Exam: Vital signs in last 24 hours: Temp:  [97.8 F (36.6 C)-98.3 F (36.8 C)] 98 F (36.7 C) (05/12 1343) Pulse Rate:  [70-105] 97 (05/12 1343) Resp:  [14-20]  17 (05/12 1343) BP: (107-141)/(62-89) 132/89 (05/12 1343) SpO2:  [95 %-100 %] 100 % (05/12 1343) Weight:  [100.2 kg] 100.2 kg (05/12 1413)   General:   Alert,  Well-developed, well-nourished, white female, pleasant and cooperative in NAD Head:  Normocephalic and atraumatic. Eyes:  Sclera clear, no icterus.   Conjunctiva pink. Ears:  Normal auditory acuity. Nose:  No deformity, discharge,  or lesions. Mouth:  No deformity or lesions.   Neck:  Supple; no masses or thyromegaly. Lungs:  Clear throughout to auscultation.   No wheezes, crackles, or rhonchi.-Exam of chest shows some erythema of the left breast around the areola, no firmness or tenderness.  She points to her mid chest just to the left of center where she feels the knot, she is tender but I do not appreciate  a definite mass or nodule.   Heart:  Regular rate and rhythm; no murmurs, clicks, rubs,  or gallops. Abdomen:  Soft,nontender, BS active,nonpalp mass or hsm.   Rectal:  Deferred -documented Hemoccult negative Msk:  Symmetrical without gross deformities. . Pulses:  Normal pulses noted. Extremities:  Without clubbing or edema. Neurologic:  Alert and  oriented x4;  grossly normal neurologically. Skin:  Intact without significant lesions or rashes.. Psych:  Alert and cooperative. Normal mood and affect.  Intake/Output from previous day: No intake/output data recorded. Intake/Output this shift: Total I/O In: 250 [IV Piggyback:250] Out: -   Lab Results: Recent Labs    01/31/20 0219 01/31/20 0934  WBC 9.2 9.1  HGB 12.3 10.7*  HCT 37.5 34.1*  PLT 285 262   BMET Recent Labs    01/31/20 0219  NA 142  K 3.4*  CL 107  CO2 22  GLUCOSE 108*  BUN 5*  CREATININE 0.51  CALCIUM 9.3   LFT Recent Labs    01/31/20 0219  PROT 7.1  ALBUMIN 3.6  AST 16  ALT 19  ALKPHOS 75  BILITOT 0.6   PT/INR No results for input(s): LABPROT, INR in the last 72 hours. Hepatitis Panel No results for input(s): HEPBSAG, HCVAB,  HEPAIGM, HEPBIGM in the last 72 hours.    IMPRESSION:  #22 39 year old white female with 2-week history of intractable nausea and vomiting with repeated ER visits.  No real abdominal pain. Today patient had one episode of small-volume hematemesis with blood streaking of emesis.  I do not think she is having an active GI bleed, she may have some retch gastropathy, or esophagitis secondary to repeated vomiting.  Etiology of persistent nausea and vomiting is not clear.  Patient is status post cholecystectomy, and labs fairly unremarkable. Rule out viral gastroenteritis, rule out possible peptic ulcer disease, gastritis, gastroparesis.  #2 status post cholecystectomy 2020 #3 history of chronic GERD on chronic Nexium 20 mg p.o. daily at home #4 left chest wall/breast nodule  Plan; continue IV fluids at 100 cc/h. IV PPI twice daily Have change Zofran to 4 mg every 6 hours around-the-clock rather than as needed Serial hemoglobins every 6 hours until tomorrow.  Transfuse for hemoglobin 7 or less. Clear liquids this evening, will make her n.p.o. after midnight. Tentatively have scheduled for EGD with Dr. Myrtie Neither tomorrow 02/01/2020. If EGD is unrevealing will need CT of the abdomen and pelvis    Amy Esterwood PA-C 01/31/2020, 3:12 PM  I have reviewed the entire case in detail with the above APP and discussed the plan in detail.  Therefore, I agree with the diagnoses recorded above. In addition,  I have personally interviewed and examined the patient and have personally reviewed any abdominal/pelvic CT scan images.  My additional thoughts are as follows:  Recent exacerbation of chronic right upper quadrant abdominal pain with reported vomiting and possible hematemesis at home.  Chart review indicates she has been seen for abdominal pain and vomiting over many months at multiple outlying healthcare institutions.  She appears to been admitted on this occasion because of the reported hematemesis.  Her  hemoglobin is decreased some from recent baseline, but she does not to have ongoing GI bleeding, much less brisk bleeding, at this point.  This patient says she had pain even before her cholecystectomy last year.  She reportedly had upper endoscopy with GI practice in the [facility] system somewhere in the last couple of years.  She takes Nexium for "reflux", though symptoms somewhat  difficult to discern.  She was totally comfortable watching television when I enter the room, has a somewhat restricted affect.  Says she is in fairly constant pain, points her right upper quadrant.  No discernible change in her demeanor or any reaction to palpation, though she says it is tender.  It is not clear to me this patient has an organic source for abdominal pain and vomiting.  An episode in outlying hospital months ago indicates it also occurred in the setting of acute stress reaction.  Patient was on Valium and an SSRI at that time, but no longer on them.  Suspect there may be a supratentorial component to symptoms. CT scan was done at least 1 of these occasions at an outlying institution, unrevealing.  I do not think a repeat CT scan is to be done at this time.   Since she reported hematemesis, we are agreeable to an upper endoscopy tomorrow.  If negative and no change in condition, she can most likely be discharged home afterwards.   Nelida Meuse III [phone]

## 2020-01-31 NOTE — ED Provider Notes (Signed)
[facility] EMERGENCY DEPARTMENT Provider Note   CSN: [id number] Arrival date & time: 01/31/20  0128     History Chief Complaint  Patient presents with  . Breast Pain  . Emesis    Felicia Sutton is a 39 y.o. female without significant past medical hx who presents to the ED with complaints of N/V that started within the past 24 hours. Patient states she has had too numerous to count episodes of emesis throughout the day. No alleviating/aggravating factors. No intervention PTA. She thinks she is vomiting secondary to pain related to a mass to her breast which has been present for a little over 1 week. States pain is constant, well localized to the mass, no other chest discomfort. Denies change in color, fever, drainage, areolar drainage, hematemesis, melena, hematochezia, diarrhea, abdominal pain, dysuria, vaginal bleeding/discharge, or dyspnea. She was seen in the ED for the breast mass within the past 1 week, referred to GYN who cannot see her until next month. She has been taking motrin/tylenol for breast mass at home without relief. No family hx of breast cancer.   HPI     History reviewed. No pertinent past medical history.  There are no problems to display for this patient.   History reviewed. No pertinent surgical history.   OB History   No obstetric history on file.     History reviewed. No pertinent family history.  Social History   Tobacco Use  . Smoking status: Never Smoker  . Smokeless tobacco: Never Used  Substance Use Topics  . Alcohol use: Never  . Drug use: Never    Home Medications Prior to Admission medications   Medication Sig Start Date End Date Taking? Authorizing Provider  famotidine (PEPCID) 20 MG tablet Take 1 tablet (20 mg total) by mouth 2 (two) times daily. Patient not taking: Reported on 01/10/2020 01/02/20   Quintella Reichert, MD  ondansetron [facility] ODT) 4 MG disintegrating tablet Take 1 tablet (4 mg total) by mouth every 8  (eight) hours as needed for nausea or vomiting. 01/24/20   Henderly, Britni A, PA-C  pantoprazole (PROTONIX) 20 MG tablet Take 1 tablet (20 mg total) by mouth daily. Patient not taking: Reported on 01/10/2020 01/02/20   Quintella Reichert, MD    Allergies    Patient has no known allergies.  Review of Systems   Review of Systems  Constitutional: Negative for chills and fever.  Respiratory: Negative for cough and shortness of breath.   Cardiovascular: Negative for chest pain.  Gastrointestinal: Positive for nausea and vomiting. Negative for abdominal pain, blood in stool, constipation and diarrhea.  Genitourinary: Negative for dysuria, vaginal bleeding and vaginal discharge.  Skin:       Positive for painful breast mass  Neurological: Negative for syncope.  All other systems reviewed and are negative.   Physical Exam Updated Vital Signs BP 128/84 (BP Location: Left Arm)   Pulse (!) 105   Temp 98.3 F (36.8 C) (Oral)   Resp 16   LMP 01/03/2020   SpO2 97%   Physical Exam Vitals and nursing note reviewed. Exam conducted with a chaperone present.  Constitutional:      General: She is not in acute distress.    Appearance: She is well-developed. She is not toxic-appearing.  HENT:     Head: Normocephalic and atraumatic.  Eyes:     General:        Right eye: No discharge.        Left eye: No discharge.  Conjunctiva/sclera: Conjunctivae normal.  Cardiovascular:     Rate and Rhythm: Regular rhythm. Tachycardia present.  Pulmonary:     Effort: Pulmonary effort is normal. No respiratory distress.     Breath sounds: Normal breath sounds. No wheezing, rhonchi or rales.  Chest:     Breasts:        Right: No bleeding, inverted nipple, nipple discharge or skin change.        Left: No bleeding, inverted nipple, nipple discharge or skin change.    Abdominal:     General: There is no distension.     Palpations: Abdomen is soft.     Tenderness: There is no abdominal tenderness. There is  no guarding or rebound.  Musculoskeletal:     Cervical back: Neck supple.  Lymphadenopathy:     Upper Body:     Right upper body: No supraclavicular or axillary adenopathy.     Left upper body: No supraclavicular or axillary adenopathy.  Skin:    General: Skin is warm and dry.     Findings: No rash.  Neurological:     Mental Status: She is alert.     Comments: Clear speech.   Psychiatric:        Behavior: Behavior normal.     ED Results / Procedures / Treatments   Labs (all labs ordered are listed, but only abnormal results are displayed) Labs Reviewed  COMPREHENSIVE METABOLIC PANEL  CBC WITH DIFFERENTIAL/PLATELET  I-STAT BETA HCG BLOOD, ED ([facility], [facility], [facility] ONLY)    EKG None  Radiology No results found.  Procedures Procedures (including critical care time)  Medications Ordered in ED Medications  sodium chloride 0.9 % bolus 1,000 mL (has no administration in time range)  ondansetron (ZOFRAN) injection 4 mg (has no administration in time range)  fentaNYL (SUBLIMAZE) injection 50 mcg (has no administration in time range)    ED Course  I have reviewed the triage vital signs and the nursing notes.  Pertinent labs & imaging results that were available during my care of the patient were reviewed by me and considered in my medical decision making (see chart for details).    MDM Rules/Calculators/A&P                     Patient presents to the ED with complaints of N/V as well as painful L breast mass. Nontoxic, vitals with mild tachycardia on arrival, otherwise unremarkable.    Additional history obtained:  Additional history obtained from review of prior visits & nursing notes.  Lab Tests:  I Ordered, reviewed, and interpreted labs, which included:  CBC: No anemia or leukocytosis.  CMP: mild hypokalemia, no significant electrolyte derangement.  Pregnancy test: Negative.   ED Course:  Patient with N/V and painful breast mass. I am able to appreciate small mobile firm  mass to the L breast, seen recently for same, palpation reproduces patients discomfort, low suspicion for underlying cardiopulmonary process. There is no erythema/warmth/drainage/induration/fluctuance- does not seem consistent with abscess, cellulitis, or mastitis. Her abdomen is nontender w/o peritoneal signs- do not suspect acute surgical process. Fentanyl, zofran & fluids ordered.   Labs are fairly unremarkable, mild hypokalemia will be orally replaced in the ED.  03:40: RE-EVAL: Patient remains with discomfort and nausea, no vomiting, will trial phenergan & toradol. PO challenge with kdur.   04:30: RE-EVAL: Patient feeling much better, tolerating PO. Overall appears appropriate for discharge home at this time. Will discharge home with supportive management and place ambulatory referral to  the breast center. I discussed results, treatment plan, need for follow-up, and return precautions with the patient. Provided opportunity for questions, patient confirmed understanding and is in agreement with plan.   Portions of this note were generated with Scientist, clinical (histocompatibility and immunogenetics). Dictation errors may occur despite best attempts at proofreading.   Vitals:   01/31/20 0330 01/31/20 0345 01/31/20 0415 01/31/20 0430  BP: 113/70 107/69 117/71 116/62  Pulse: 87 88 83 95  Temp:      Resp: 14 16 15 19   SpO2: 97% 98% 99% 100%  TempSrc:       Final Clinical Impression(s) / ED Diagnoses Final diagnoses:  Non-intractable vomiting with nausea, unspecified vomiting type  Breast pain    Rx / DC Orders ED Discharge Orders         Ordered    potassium chloride SA (KLOR-CON) 20 MEQ tablet  Daily     01/31/20 0431    meloxicam (MOBIC) 15 MG tablet  Daily PRN     01/31/20 0431    promethazine (PHENERGAN) 25 MG tablet  Every 6 hours PRN     01/31/20 0431    Ambulatory referral to Breast Clinic     01/31/20 0431           04/01/20, PA-C 01/31/20 0438    04/01/20, MD 01/31/20  [phone]

## 2020-01-31 NOTE — ED Notes (Signed)
Pt tolerating water well

## 2020-02-01 ENCOUNTER — Observation Stay (HOSPITAL_COMMUNITY): Payer: Medicare Other | Admitting: Anesthesiology

## 2020-02-01 ENCOUNTER — Encounter (HOSPITAL_COMMUNITY): Payer: Self-pay | Admitting: Internal Medicine

## 2020-02-01 ENCOUNTER — Encounter (HOSPITAL_COMMUNITY)
Admission: EM | Disposition: A | Discharge status: Home / Self Care | Payer: Self-pay | Source: Home / Self Care | Attending: Internal Medicine

## 2020-02-01 DIAGNOSIS — N6320 Unspecified lump in the left breast, unspecified quadrant: Secondary | ICD-10-CM | POA: Diagnosis present

## 2020-02-01 DIAGNOSIS — F430 Acute stress reaction: Secondary | ICD-10-CM | POA: Diagnosis present

## 2020-02-01 DIAGNOSIS — E876 Hypokalemia: Secondary | ICD-10-CM | POA: Diagnosis present

## 2020-02-01 DIAGNOSIS — R000 Tachycardia, unspecified: Secondary | ICD-10-CM | POA: Diagnosis present

## 2020-02-01 DIAGNOSIS — D638 Anemia in other chronic diseases classified elsewhere: Secondary | ICD-10-CM | POA: Diagnosis present

## 2020-02-01 DIAGNOSIS — K2950 Unspecified chronic gastritis without bleeding: Secondary | ICD-10-CM | POA: Diagnosis present

## 2020-02-01 DIAGNOSIS — Z6838 Body mass index (BMI) 38.0-38.9, adult: Secondary | ICD-10-CM | POA: Diagnosis not present

## 2020-02-01 DIAGNOSIS — Z9049 Acquired absence of other specified parts of digestive tract: Secondary | ICD-10-CM | POA: Diagnosis not present

## 2020-02-01 DIAGNOSIS — Z20822 Contact with and (suspected) exposure to covid-19: Secondary | ICD-10-CM | POA: Diagnosis present

## 2020-02-01 DIAGNOSIS — G8929 Other chronic pain: Secondary | ICD-10-CM | POA: Diagnosis present

## 2020-02-01 DIAGNOSIS — N200 Calculus of kidney: Secondary | ICD-10-CM | POA: Diagnosis present

## 2020-02-01 DIAGNOSIS — Z79899 Other long term (current) drug therapy: Secondary | ICD-10-CM | POA: Diagnosis not present

## 2020-02-01 DIAGNOSIS — K30 Functional dyspepsia: Secondary | ICD-10-CM | POA: Diagnosis present

## 2020-02-01 DIAGNOSIS — K319 Disease of stomach and duodenum, unspecified: Secondary | ICD-10-CM | POA: Diagnosis present

## 2020-02-01 DIAGNOSIS — R1011 Right upper quadrant pain: Secondary | ICD-10-CM | POA: Diagnosis not present

## 2020-02-01 DIAGNOSIS — K3184 Gastroparesis: Secondary | ICD-10-CM | POA: Diagnosis present

## 2020-02-01 DIAGNOSIS — K920 Hematemesis: Secondary | ICD-10-CM | POA: Diagnosis present

## 2020-02-01 DIAGNOSIS — K2100 Gastro-esophageal reflux disease with esophagitis, without bleeding: Secondary | ICD-10-CM | POA: Diagnosis present

## 2020-02-01 DIAGNOSIS — F17210 Nicotine dependence, cigarettes, uncomplicated: Secondary | ICD-10-CM | POA: Diagnosis present

## 2020-02-01 DIAGNOSIS — K921 Melena: Secondary | ICD-10-CM | POA: Diagnosis present

## 2020-02-01 DIAGNOSIS — Z8719 Personal history of other diseases of the digestive system: Secondary | ICD-10-CM | POA: Diagnosis not present

## 2020-02-01 DIAGNOSIS — E669 Obesity, unspecified: Secondary | ICD-10-CM | POA: Diagnosis present

## 2020-02-01 DIAGNOSIS — R112 Nausea with vomiting, unspecified: Secondary | ICD-10-CM | POA: Diagnosis not present

## 2020-02-01 HISTORY — PX: ESOPHAGOGASTRODUODENOSCOPY (EGD) WITH PROPOFOL: SHX5813

## 2020-02-01 LAB — COMPREHENSIVE METABOLIC PANEL
ALT: 16 U/L (ref 0–44)
AST: 22 U/L (ref 15–41)
Albumin: 3.3 g/dL — ABNORMAL LOW (ref 3.5–5.0)
Alkaline Phosphatase: 64 U/L (ref 38–126)
Anion gap: 11 (ref 5–15)
BUN: 5 mg/dL — ABNORMAL LOW (ref 6–20)
CO2: 19 mmol/L — ABNORMAL LOW (ref 22–32)
Calcium: 8.3 mg/dL — ABNORMAL LOW (ref 8.9–10.3)
Chloride: 109 mmol/L (ref 98–111)
Creatinine, Ser: 0.68 mg/dL (ref 0.44–1.00)
GFR calc Af Amer: >60 mL/min (ref >60)
GFR calc non Af Amer: >60 mL/min (ref >60)
Glucose, Bld: 90 mg/dL (ref 70–99)
Potassium: 4 mmol/L (ref 3.5–5.1)
Sodium: 139 mmol/L (ref 135–145)
Total Bilirubin: 0.9 mg/dL (ref 0.3–1.2)
Total Protein: 6.2 g/dL — ABNORMAL LOW (ref 6.5–8.1)

## 2020-02-01 LAB — HEMOGLOBIN AND HEMATOCRIT, BLOOD
HCT: 33.3 % — ABNORMAL LOW (ref 36.0–46.0)
HCT: 32.8 % — ABNORMAL LOW (ref 36.0–46.0)
Hemoglobin: 11.1 g/dL — ABNORMAL LOW (ref 12.0–15.0)
Hemoglobin: 10.8 g/dL — ABNORMAL LOW (ref 12.0–15.0)

## 2020-02-01 LAB — HEMOGLOBIN A1C
Hgb A1c MFr Bld: 5 % (ref 4.8–5.6)
Mean Plasma Glucose: 96.8 mg/dL

## 2020-02-01 SURGERY — ESOPHAGOGASTRODUODENOSCOPY (EGD) WITH PROPOFOL
Anesthesia: Monitor Anesthesia Care

## 2020-02-01 MED ORDER — ONDANSETRON HCL 4 MG/2ML IJ SOLN
INTRAMUSCULAR | Status: DC | PRN
  Administered 2020-02-01: 4 mg via INTRAVENOUS

## 2020-02-01 MED ORDER — PROPOFOL 500 MG/50ML IV EMUL: INTRAVENOUS | Status: DC | PRN

## 2020-02-01 MED ORDER — PROPOFOL 500 MG/50ML IV EMUL
INTRAVENOUS | Status: DC | PRN
  Administered 2020-02-01: 100 ug/kg/min via INTRAVENOUS

## 2020-02-01 MED ORDER — DICLOFENAC SODIUM 1 % EX GEL
2.0000 g | Freq: Four times a day (QID) | CUTANEOUS | Status: DC | PRN
  Administered 2020-02-01 – 2020-02-02 (×2): 2 g via TOPICAL
  Filled 2020-02-01: qty 100

## 2020-02-01 MED ORDER — METOCLOPRAMIDE HCL 5 MG/ML IJ SOLN
5.0000 mg | Freq: Once | INTRAMUSCULAR | Status: AC
  Administered 2020-02-01: 5 mg via INTRAVENOUS
  Filled 2020-02-01: qty 2

## 2020-02-01 MED ORDER — LIDOCAINE VISCOUS HCL 2 % MT SOLN
15.0000 mL | Freq: Once | OROMUCOSAL | Status: AC
  Administered 2020-02-01: 15 mL via ORAL
  Filled 2020-02-01: qty 15

## 2020-02-01 MED ORDER — ONDANSETRON HCL 4 MG/2ML IJ SOLN
4.0000 mg | Freq: Once | INTRAMUSCULAR | Status: AC
  Administered 2020-02-01: 4 mg via INTRAVENOUS
  Filled 2020-02-01: qty 2

## 2020-02-01 MED ORDER — LIDOCAINE VISCOUS HCL 2 % MT SOLN
15.0000 mL | Freq: Once | OROMUCOSAL | Status: AC
  Administered 2020-02-01: 15 mL via OROMUCOSAL
  Filled 2020-02-01: qty 15

## 2020-02-01 MED ORDER — ALUM & MAG HYDROXIDE-SIMETH 200-200-20 MG/5ML PO SUSP
30.0000 mL | Freq: Once | ORAL | Status: AC
  Administered 2020-02-01: 30 mL via ORAL
  Filled 2020-02-01: qty 30

## 2020-02-01 MED ORDER — PROPOFOL 10 MG/ML IV BOLUS
INTRAVENOUS | Status: DC | PRN
  Administered 2020-02-01: 30 mg via INTRAVENOUS

## 2020-02-01 MED ORDER — LACTATED RINGERS IV SOLN
INTRAVENOUS | Status: DC
  Administered 2020-02-01: 1000 mL via INTRAVENOUS

## 2020-02-01 SURGICAL SUPPLY — 15 items

## 2020-02-01 NOTE — Anesthesia Postprocedure Evaluation (Signed)
Anesthesia Post Note  Patient: Shatora Weatherbee  Procedure(s) Performed: ESOPHAGOGASTRODUODENOSCOPY (EGD) WITH PROPOFOL (N/A )     Patient location during evaluation: Endoscopy Anesthesia Type: MAC Level of consciousness: awake and alert, oriented and patient cooperative Pain management: pain level controlled Vital Signs Assessment: post-procedure vital signs reviewed and stable Respiratory status: spontaneous breathing, nonlabored ventilation and respiratory function stable Cardiovascular status: blood pressure returned to baseline and stable Postop Assessment: no apparent nausea or vomiting Anesthetic complications: no    Last Vitals:  Vitals:   02/01/20 1255 02/01/20 1313  BP: 102/67 115/75  Pulse: 64 61  Resp: 15 14  Temp:  36.6 C  SpO2: 99% 100%    Last Pain:  Vitals:   02/01/20 1313  TempSrc: Oral  PainSc:                  Wang Granada,E. Laylah Riga

## 2020-02-01 NOTE — Progress Notes (Signed)
   Brief History:  Felicia Sutton is a 39 year old female with a past medical history significant for GERD and frequent ED visits over the past year for abd pain, chest pain, n/v.  Subjective:  [name] was evaluated and examined this morning bedside. Patient reports that she is still experiencing nausea, but has not vomited since admission. She denies any fever, chills, or chest pain. She is still experiencing tenderness from left breast mass. She is endorsing RLQ abdominal pain that she says that she says has been present for 2 years.  Objective:  Vital signs in last 24 hours: Vitals:   02/01/20 1058 02/01/20 1245 02/01/20 1255 02/01/20 1313  BP:  (!) 86/44 102/67 115/75  Pulse: 64  64 61  Resp: 16 19 15 14   Temp: 98.4 F (36.9 C) (!) 97.4 F (36.3 C)  97.8 F (36.6 C)  TempSrc: Oral Axillary  Oral  SpO2: 99% 98% 99% 100%  Weight:      Height:       Weight change:   Intake/Output Summary (Last 24 hours) at 02/01/2020 1327 Last data filed at 02/01/2020 0500 Gross per 24 hour  Intake 1226.54 ml  Output 400 ml  Net 826.54 ml   Physical Exam: Constitutional: Normal appearance. She iswell-developed. She is notill-appearingor diaphoretic. In no apparent distress. Head: Normocephalicand atraumatic. Eyes: Extra ocular movements in tact.  Cardiovascular: S1, normal. S2, normal. Regular rate and rhythm. No murmurs, rubs, or gallops. Pulmonary: Normal respiratory effort. Lungs clear to ascultation throughout.  Abdominal: There is no distension. Abdomen is soft. Mild abdominal tenderness in RLQ. There is noguardingor rebound.  Skin: Skin is warmand dry. Firm, mobile, tender mass upper midline of left breast. No erythema or increased warmth over mass. Psychiatric: Mood and affect appropriate.    Assessment/Plan:  Active Problems:   Hematemesis   Abdominal pain, chronic, right upper quadrant   Nausea and vomiting in adult  Felicia Sutton is a 39 year old female with a  past medical history significant for GERD and frequent ED visits over the past year for abd pain, chest pain, n/v and normal findings on EGD.   # Acute Nausea/Vomiting # Hematemesis # Anemia Patient presented to the ED with nausea vomiting and an episode of hematemesis. GI consulted and EGD was performed today. Assessment:  EGD revealed no abnormalities or active bleeding, indicating potential functional etiology. Gastroparesis could explain the recurring episodes of nausea and vomiting with intermittent abdominal pain. Plan: - Gastric emptying study to evaluate gastric function. - Zofran 4mg , oral. Every 6 hours, PRN. - Pantoprazole 40 mg IV. Every 12 hours. - Tylenol 650 mg, oral. Every 6 hours, PRN. - NPO beginning at midnight.  # Breast Mass Patient presented with small, firm, mobile left breast mass. Mass is present in the upper midline border of left breast. No erythema, drainage, nipple discharge, changes in coloration/texture, or axillary lymphadenopathy. Plan: - Patient will need outpatient follow up outpatient with Ob/Gyn for mammogram/ultrasound.  # Hypokalemia BMP ordered. Potassium corrected to 4.0 this morning.   CODE Status: FULL DVT prophylaxis: SCDs Diet: NPO starting at midnight.     LOS: 0 days   24, Medical Student 02/01/2020, 1:27 PM

## 2020-02-01 NOTE — Care Management Obs Status (Signed)
MEDICARE OBSERVATION STATUS NOTIFICATION   Patient Details  Name: Felicia Sutton MRN: [id number] Date of Birth: 01-25-1981   Medicare Observation Status Notification Given:  Yes    Leone Haven, RN 02/01/2020, 10:06 AM

## 2020-02-01 NOTE — Op Note (Addendum)
[facility] Patient Name: Felicia Sutton Procedure Date : 02/01/2020 MRN: [id number] Attending MD: Starr Lake. Myrtie Neither , MD Date of Birth: 22-May-1981 CSN: [id number] Age: 40 Admit Type: Inpatient Procedure:                Upper GI endoscopy Indications:              Abdominal pain in the right upper quadrant,                            Hematemesis (self reported prior to arrival in ED                            yesterday), Nausea with vomiting Providers:                Sherilyn Cooter L. Myrtie Neither, MD, Alexandria Lodge RN, RN, Arlee Muslim Tech., Technician, Gwenyth Allegra CRNA, CRNA Referring MD:             Medical Teaching Service Medicines:                Monitored Anesthesia Care Complications:            No immediate complications. Estimated Blood Loss:     Estimated blood loss: none. Procedure:                Pre-Anesthesia Assessment:                           - Prior to the procedure, a History and Physical                            was performed, and patient medications and                            allergies were reviewed. The patient's tolerance of                            previous anesthesia was also reviewed. The risks                            and benefits of the procedure and the sedation                            options and risks were discussed with the patient.                            All questions were answered, and informed consent                            was obtained. Prior Anticoagulants: The patient has                            taken no previous anticoagulant or antiplatelet  agents. ASA Grade Assessment: II - A patient with                            mild systemic disease. After reviewing the risks                            and benefits, the patient was deemed in                            satisfactory condition to undergo the procedure.                           After obtaining informed consent, the  endoscope was                            passed under direct vision. Throughout the                            procedure, the patient's blood pressure, pulse, and                            oxygen saturations were monitored continuously. The                            GIF-H190 ([id number]) Olympus gastroscope was                            introduced through the mouth, and advanced to the                            second part of duodenum. The upper GI endoscopy was                            accomplished without difficulty. The patient                            tolerated the procedure well. Scope In: Scope Out: Findings:      The esophagus was normal.      The stomach was normal.      The cardia and gastric fundus were normal on retroflexion.      The examined duodenum was normal. Impression:               - Normal esophagus.                           - Normal stomach.                           - Normal examined duodenum.                           - No specimens collected.                           No source of hematemesis seen.  Years of chronic upper abdominal pain with                            intermittent nausea and vomiting, suspected to be                            functional in nature. Imaging performed on prior                            visit(s) to multiple outside institutions for same                            symptoms. Recommendation:           - Return patient to hospital ward for possible                            discharge same day.                           - Resume regular diet.                           - Ondansetron                           - Avoid opioids                           - Follow up with patient's primary GI in [facility]                            system. Procedure Code(s):        --- Professional ---                           [phone], Esophagogastroduodenoscopy, flexible,                            transoral; diagnostic,  including collection of                            specimen(s) by brushing or washing, when performed                            (separate procedure) Diagnosis Code(s):        --- Professional ---                           R10.11, Right upper quadrant pain                           K92.0, Hematemesis                           R11.2, Nausea with vomiting, unspecified CPT copyright 2019 American Medical Association. All rights reserved. The codes documented in this report are preliminary and upon coder review may  be revised  to meet current compliance requirements. Tykeshia Tourangeau L. Myrtie Neither, MD 02/01/2020 12:50:45 PM This report has been signed electronically. Number of Addenda: 0

## 2020-02-01 NOTE — Transfer of Care (Signed)
Immediate Anesthesia Transfer of Care Note  Patient: Felicia Sutton  Procedure(s) Performed: ESOPHAGOGASTRODUODENOSCOPY (EGD) WITH PROPOFOL (N/A )  Patient Location: PACU and Endoscopy Unit  Anesthesia Type:MAC  Level of Consciousness: awake, alert  and oriented  Airway & Oxygen Therapy: Patient Spontanous Breathing  Post-op Assessment: Report given to RN and Post -op Vital signs reviewed and stable  Post vital signs: Reviewed and stable  Last Vitals:  Vitals Value Taken Time  BP    Temp    Pulse 70 02/01/20 1245  Resp 16 02/01/20 1245  SpO2 98 % 02/01/20 1245  Vitals shown include unvalidated device data.  Last Pain:  Vitals:   02/01/20 1058  TempSrc: Oral  PainSc: 0-No pain      Patients Stated Pain Goal: 0 (15/83/09 4076)  Complications: No apparent anesthesia complications

## 2020-02-01 NOTE — Progress Notes (Signed)
Patient returned to unit. 

## 2020-02-01 NOTE — Anesthesia Preprocedure Evaluation (Addendum)
Anesthesia Evaluation  Patient identified by MRN, date of birth, ID band Patient awake    Reviewed: Allergy & Precautions, NPO status , Patient's Chart, lab work & pertinent test results  History of Anesthesia Complications Negative for: history of anesthetic complications  Airway Mallampati: II  TM Distance: >3 FB Neck ROM: Full    Dental  (+) Poor Dentition, Dental Advisory Given, Chipped   Pulmonary  01/31/2020 SARS coronavirus NEG   breath sounds clear to auscultation       Cardiovascular negative cardio ROS   Rhythm:Regular Rate:Normal     Neuro/Psych negative neurological ROS     GI/Hepatic Neg liver ROS, nausea   Endo/Other  Morbid obesity  Renal/GU negative Renal ROS     Musculoskeletal   Abdominal (+) + obese,   Peds  Hematology  (+) Blood dyscrasia (Hb 10.8), anemia ,   Anesthesia Other Findings   Reproductive/Obstetrics LMP 01/10/2020                            Anesthesia Physical Anesthesia Plan  ASA: II  Anesthesia Plan: MAC   Post-op Pain Management:    Induction:   PONV Risk Score and Plan: 2 and Treatment may vary due to age or medical condition  Airway Management Planned: Natural Airway and Nasal Cannula  Additional Equipment: None  Intra-op Plan:   Post-operative Plan:   Informed Consent: I have reviewed the patients History and Physical, chart, labs and discussed the procedure including the risks, benefits and alternatives for the proposed anesthesia with the patient or authorized representative who has indicated his/her understanding and acceptance.     Dental advisory given  Plan Discussed with: CRNA and Surgeon  Anesthesia Plan Comments:        Anesthesia Quick Evaluation

## 2020-02-01 NOTE — Progress Notes (Addendum)
  Date: 02/01/2020  Patient name: Felicia Sutton  Medical record number: [id number]  Date of birth: Oct 21, 1980   I have seen and evaluated Felicia Sutton and discussed their care with the Residency Team. Briefly, [name] reports history of nausea, vomiting, hematemesis (1 cup, dark).  Vomiting occurs after eating.  Started after noting a painful breast lump about 1 week ago.  She has never had a mammogram or [place] of this area.  She was initially noted to be hypokalemic which was replaced.  She was also started on an acid reducer and GI was consulted.  She has been seen on multiple occasions this year for similar presentations.  Hgb was 10.7 on admit and has remained stable.   PMHx, Fam Hx, and/or Soc Hx : Reported no significant medical history.  Previous smoker.  No ETOH use  Vitals:   02/01/20 1623 02/01/20 1959  BP: 108/64 110/75  Pulse: 73 70  Resp: 16 18  Temp: 98.6 F (37 C) 99.1 F (37.3 C)  SpO2: 98% 100%   General: Fatigued appearing, no acute distress Eyes: Anicteric sclerae HENT: Poor dentition, greenish coloration to tongue CV: RR, NR, no murmur Chest wall: She has a tender ill defined mass lateral to the sternum on the left which is above the level of breast tissue.  Does not appear to be contiguous with breast tissue.  Breast without discharge, no axillary LAD on the left.  Pulm: CTAB, no wheezing Abd: + TTP in RLQ and minimal in RUQ.  No rebound, no guarding MSK: Normal tone Skin: No apparent hair tearing, no rash  Assessment and Plan: I have seen and evaluated the patient as outlined above. I agree with the formulated Assessment and Plan as detailed in the residents' note, with the following changes:   1. Acute nausea/vomiting, hematemesis, anemia - GI consult planned --> EGD today - Would consider GES if EGD normal - Trend CBC - Trial of food when able - Continue fluids while NPO - IV protonix  2. Hypokalemia - Replace potassium and recheck was WNL  3. Breast  mass - I think this is mist likely a chest wall issue.  Consider topical therapy - OP mammogram with ultrasound given age.   Other medical issues - stable.  If no organic cause for symptoms found, consider IBS or possibly psychosomatic disease.   Inez Catalina, MD 5/13/20218:13 PM

## 2020-02-01 NOTE — Progress Notes (Signed)
Patient off of unit to procedure. 

## 2020-02-01 NOTE — Interval H&P Note (Signed)
History and Physical Interval Note:  02/01/2020 12:17 PM  Felicia Sutton  has presented today for surgery, with the diagnosis of Persistent nausea vomiting, low-grade hematemesis.  The various methods of treatment have been discussed with the patient and family. After consideration of risks, benefits and other options for treatment, the patient has consented to  Procedure(s): ESOPHAGOGASTRODUODENOSCOPY (EGD) WITH PROPOFOL (N/A) as a surgical intervention.  The patient's history has been reviewed, patient examined, no change in status, stable for surgery.  I have reviewed the patient's chart and labs.  Questions were answered to the patient's satisfaction.    Hgb 10.8 today  [name]

## 2020-02-02 ENCOUNTER — Inpatient Hospital Stay (HOSPITAL_COMMUNITY): Payer: Medicare Other

## 2020-02-02 MED ORDER — METOCLOPRAMIDE HCL 5 MG PO TABS: 5.0000 mg | ORAL_TABLET | Freq: Three times a day (TID) | ORAL | 0 refills | Status: DC

## 2020-02-02 MED ORDER — TECHNETIUM TC 99M SULFUR COLLOID
2.1000 | Freq: Once | INTRAVENOUS | Status: AC | PRN
  Administered 2020-02-02: 2.1 via INTRAVENOUS

## 2020-02-02 MED ORDER — ONDANSETRON HCL 4 MG/2ML IJ SOLN
4.0000 mg | Freq: Once | INTRAMUSCULAR | Status: AC
  Administered 2020-02-02: 4 mg via INTRAVENOUS

## 2020-02-02 MED ORDER — PANTOPRAZOLE SODIUM 40 MG PO TBEC: 40.0000 mg | DELAYED_RELEASE_TABLET | Freq: Every day | ORAL | 0 refills | Status: DC

## 2020-02-02 MED ORDER — PANTOPRAZOLE SODIUM 40 MG PO TBEC
40.0000 mg | DELAYED_RELEASE_TABLET | Freq: Two times a day (BID) | ORAL | Status: DC
  Administered 2020-02-02 – 2020-02-03 (×3): 40 mg via ORAL
  Filled 2020-02-02 (×4): qty 1

## 2020-02-02 NOTE — Discharge Instructions (Signed)
Please follow up with your gastroenterologist following discharge. Your EGD did not show any inflammation, ulcers or bleeding in the top part of your GI tract.  The gastric emptying study did show that your it takes your stomach a longer time than normal to digest food which can, in turn, cause abdominal pain and nausea/vomiting. This is called gastroparesis.The most common cause of gastroparesis is diabetes however your blood work did not indicate high glucose levels in your blood.  Treatment involves a multifactorial approach. One of the medications that can help with this is metoclopramide (Reglan). This can increase motility in your gut to help move food along in your gut a little quicker. Diet modifications can also provide some relief but can vary between different individuals so may take a little while to figure out. Some of the main things to try avoiding are large meals, high protein meals, and spicy food.  I would like you to go see you gastroenterologist as well so they can further assist you in managing this.   Take care and we wish you the very best!

## 2020-02-02 NOTE — Progress Notes (Signed)
   Brief History:  Felicia Sutton is a 39 year old female with a past medical history significant for GERDand frequent ED visits over the past year for abd pain, chest pain, n/v.  Subjective:  [name] was evaluated and examined bedside this morning. Patient reports that she is not experiencing any nausea. She stated that she threw up twice yesterday afternoon/evening. She denies any fevers, chest pain, or stomach pain, however some abdominal tenderness to deep palpation of periumbilical region. We discussed the results of the EGD she underwent yesterday, as well as the patient's gastric emptying study scheduled for today.  Objective:  Vital signs in last 24 hours: Vitals:   02/01/20 1959 02/02/20 0100 02/02/20 0454 02/02/20 0459  BP: 110/75   113/73  Pulse: 70   66  Resp: 18   12  Temp: 99.1 F (37.3 C)   98 F (36.7 C)  TempSrc: Oral   Oral  SpO2: 100% 100%  97%  Weight:   98.1 kg   Height:       Weight change: -2.132 kg  Intake/Output Summary (Last 24 hours) at 02/02/2020 0819 Last data filed at 02/02/2020 0500 Gross per 24 hour  Intake 1422.7 ml  Output 650 ml  Net 772.7 ml   Physical Exam: Constitutional: Normal appearance. She iswell-developed. She is notill-appearingor diaphoretic.In no apparent distress. Head: Normocephalicand atraumatic. Eyes:Extra ocular movements in tact. Cardiovascular: S1, normal. S2, normal. Regular rate and rhythm. No murmurs, rubs, or gallops. Pulmonary:Normal respiratory effort. Lungs clear to ascultation throughout. Abdominal: There is no distension. Abdomen is soft. Mild abdominal tenderness to deep palpation in periumbilical region. There is noguardingor rebound.  Skin: Skin is warmand dry. Psychiatric:Mood and affect appropriate.   Assessment/Plan:  Active Problems:   Hematemesis   Abdominal pain, chronic, right upper quadrant   Nausea and vomiting in adult  Felicia Sutton is a 39 year old female with a past  medical history significant for GERDand frequent ED visits over the past year for abd pain, chest pain, n/v and normal findings on EGD.   # Acute Nausea/Vomiting # Hematemesis # Anemia Patient presented to the ED with nausea vomiting and an episode of hematemesis. GI consulted and EGD was normal with no signs of acute bleeding. Patient reported two episodes of post-prandial vomiting yesterday. Gastric emptying study scheduled for today. Assessment:  EGD revealed no abnormalities or active bleeding, indicating potential functional etiology. Gastroparesis could explain the recurring episodes of nausea and vomiting with intermittent abdominal pain. Plan: - Gastric emptying study to evaluate gastric function. - Switch pantoprazole to oral following gastric emptying study. - Zofran 4mg , oral. Every 6 hours, PRN. - Tylenol 650 mg, oral. Every 6 hours, PRN. - Goal to discharge following gastric emptying study.  # Breast Mass Patient presented with small, firm, mobile left breast mass. Mass is present in the upper midline border of left breast. No erythema, drainage, nipple discharge, changes in coloration/texture, or axillary lymphadenopathy. Plan: - Patient will need outpatient follow up outpatient with Ob/Gyn for mammogram/ultrasound.    CODE Status: FULL DVT prophylaxis: SCDs Diet: NPO starting at midnight.    LOS: 1 day   , Medical Student 02/02/2020, 8:19 AM

## 2020-02-02 NOTE — Progress Notes (Signed)
Paged MD regarding patient's chest wall pain, nausea/vomiting, and stomach pain.  MD advised to give scheduled zofran early, placed orders for voltaren gel for chest wall pain, and placed order for GI cocktail for stomach discomfort.

## 2020-02-02 NOTE — Discharge Summary (Signed)
Name: Felicia Sutton MRN: [id number] DOB: 1980-10-02 39 y.o. PCP: Patient, No Pcp Per  Date of Admission: 01/31/2020  8:31 AM Date of Discharge: 02/03/2020 Attending Physician: Sid Falcon, MD  Discharge Diagnosis: 1. Subjective hematemesis 2.  Gastroparesis  Discharge Medications: Allergies as of 02/03/2020   No Known Allergies     Medication List    STOP taking these medications   potassium chloride SA 20 MEQ tablet Commonly known as: KLOR-CON     TAKE these medications   metoCLOPramide 5 MG tablet Commonly known as: REGLAN Take 1-2 tablets (5-10 mg total) by mouth 3 (three) times daily with meals. Use for 5-7 days during flares Notes to patient: Tonight, before dinner, 02/03/20.   pantoprazole 40 MG tablet Commonly known as: PROTONIX Take 1 tablet (40 mg total) by mouth daily. Notes to patient: Tomorrow, 02/04/20, 1 hr before meal or other medication.   promethazine 25 MG tablet Commonly known as: PHENERGAN Take 1 tablet (25 mg total) by mouth every 6 (six) hours as needed for nausea or vomiting.       Disposition and follow-up:   Felicia Sutton was discharged from [facility] in Stable condition.  At the hospital follow up visit please address:  1.  Subjective hematemesis with idiopathic abdominal pain.  EGD unremarkable.  Positive gastric emptying study indicating gastroparesis.  She was started on Reglan and given instructions to follow-up with her GI physician at [facility]. Gastroparesis: Noted on gastric emptying study, started on reglan.   2.  Labs / imaging needed at time of follow-up: none  3.  Pending labs/ test needing follow-up: none  Follow-up Appointments: Follow-up [place] Follow up on 02/28/2020.   Why: June 9th at 9:20am Please call [phone] if you need to reach their office. Contact information: [place] [phone]       Gastroenterologist  Follow up.   Why: Please arrange an appointment with gastroenterology.          Hospital Course: 39 yo female with PMH of chronic gastritis who presented to [facility] on 01/31/20 for reported hematemesis. Pt had been seen and discharged from the ED for n/v about 4h prior to returning. On admission, pt reported that she had one episode of dark red emesis which is what prompted her to return to the ED. She also noted abdominal pain that was primarily in the RLQ at the time of admission. She had denied prior episodes of abdominal pain or vomiting. Oddly, this is contradictory to her chart review, which reveals around 15 ED visits this year (primarily since March) for various complaints including abd pain, chest pain, and flank pain. Over the past year, she has had approximately 8 abdominal CTs and 3 pelvic ultrasounds. Non-obstructive nephrolithiasis was present on 2 CTs, otherwise workup was unremarkable.  Despite this previous workup, GI was consulted due to the reported hematemesis. Hemoglobin remained stable and she underwent EGD on 5/13 which was unremarkable and did not reveal the chronic gastritis seen on an EGD in 2018. We also obtained a gastric emptying study on 5/14 which was positive suggestive of gastroparesis.  We started her on Reglan with instructions for her to follow-up with your GI physician at [facility].  During her hospitalization, her abdominal pain seemed migratory. As stated above, on admission, was primarily in her RLL. It seemed to migrate to the epigastrum and right flank as well. Lipase normal.  Also noted on chart review, pt has had several significant recent life stressors including the death of her son in 2022-10-26 from COVID and the poisioning of 2 family members in two separate incidents.  I suspect that these stressors may be playing a part in her abdominal pain as well in addition to the gastroparesis   Discharge Vitals:   BP 109/74 (BP Location: Left Arm)   Pulse 71    Temp 98.2 F (36.8 C) (Oral)   Resp 18   Ht 5\' 2"  (1.575 m)   Wt 96.5 kg Comment: scale c  LMP 01/22/2020   SpO2 97%   BMI 38.90 kg/m   Pertinent Labs, Studies, and Procedures:  CBC Latest Ref Rng & Units 02/01/2020 02/01/2020 01/31/2020  WBC 4.0 - 10.5 K/uL - - -  Hemoglobin 12.0 - 15.0 g/dL 10.8(L) 11.1(L) 11.3(L)  Hematocrit 36.0 - 46.0 % 32.8(L) 33.3(L) 35.5(L)  Platelets 150 - 400 K/uL - - -   BMP Latest Ref Rng & Units 02/03/2020 02/01/2020 01/31/2020  Glucose 70 - 99 mg/dL 97 90 04/01/2020)  BUN 6 - 20 mg/dL 10 5(L) 5(L)  Creatinine 0.44 - 1.00 mg/dL 662(H 4.76 5.46  Sodium 135 - 145 mmol/L 138 139 142  Potassium 3.5 - 5.1 mmol/L 3.7 4.0 3.4(L)  Chloride 98 - 111 mmol/L 102 109 107  CO2 22 - 32 mmol/L 27 19(L) 22  Calcium 8.9 - 10.3 mg/dL 5.03) 8.3(L) 9.3    5/13 EGD> unremarkable. No gastritis or bleeding 5/14 gastric emptying study> delayed emptying suggestive of gastroparesis   Signed: 6/14, MD 02/04/2020, 12:56 PM

## 2020-02-02 NOTE — Plan of Care (Signed)
  Problem: Education: Goal: Knowledge of General Education information will improve Description: Including pain rating scale, medication(s)/side effects and non-pharmacologic comfort measures Outcome: Progressing   Problem: Health Behavior/Discharge Planning: Goal: Ability to manage health-related needs will improve Outcome: Progressing Discussed benefits of gastric empyting study, diet and lifestyle changes.  Problem: Activity: Goal: Risk for activity intolerance will decrease Outcome: Progressing  Patient up to bathroom with no c/o of worsened pain or SOB.

## 2020-02-03 LAB — BASIC METABOLIC PANEL
Anion gap: 9 (ref 5–15)
BUN: 10 mg/dL (ref 6–20)
CO2: 27 mmol/L (ref 22–32)
Calcium: 8.7 mg/dL — ABNORMAL LOW (ref 8.9–10.3)
Chloride: 102 mmol/L (ref 98–111)
Creatinine, Ser: 0.7 mg/dL (ref 0.44–1.00)
GFR calc Af Amer: >60 mL/min (ref >60)
GFR calc non Af Amer: >60 mL/min (ref >60)
Glucose, Bld: 97 mg/dL (ref 70–99)
Potassium: 3.7 mmol/L (ref 3.5–5.1)
Sodium: 138 mmol/L (ref 135–145)

## 2020-02-03 MED ORDER — METOCLOPRAMIDE HCL 10 MG PO TABS
10.0000 mg | ORAL_TABLET | Freq: Three times a day (TID) | ORAL | Status: DC
  Administered 2020-02-03: 10 mg via ORAL
  Filled 2020-02-03: qty 1

## 2020-02-03 NOTE — Plan of Care (Signed)
  Problem: Education: Goal: Knowledge of General Education information will improve Description: Including pain rating scale, medication(s)/side effects and non-pharmacologic comfort measures Outcome: Progressing   Problem: Health Behavior/Discharge Planning: Goal: Ability to manage health-related needs will improve Outcome: Progressing  Patient still with vomiting and nausea this morning, MD notified. Reglan started.

## 2020-02-03 NOTE — Plan of Care (Signed)
  Problem: Education: Goal: Knowledge of General Education information will improve Description: Including pain rating scale, medication(s)/side effects and non-pharmacologic comfort measures 02/03/2020 1524 by Luellen Pucker, RN Outcome: Progressing 02/03/2020 1128 by Luellen Pucker, RN Outcome: Progressing   Problem: Health Behavior/Discharge Planning: Goal: Ability to manage health-related needs will improve 02/03/2020 1524 by Luellen Pucker, RN Outcome: Progressing 02/03/2020 1128 by Luellen Pucker, RN Outcome: Progressing   Problem: Nutrition: Goal: Adequate nutrition will be maintained 02/03/2020 1524 by Luellen Pucker, RN Outcome: Progressing 02/03/2020 1128 by Luellen Pucker, RN Outcome: Not Progressing  Patient tolerating small lunch after reglan administration.

## 2020-02-03 NOTE — Plan of Care (Signed)
  Problem: Education: Goal: Knowledge of General Education information will improve Description: Including pain rating scale, medication(s)/side effects and non-pharmacologic comfort measures 02/03/2020 1658 by Luellen Pucker, RN Outcome: Adequate for Discharge 02/03/2020 1525 by Luellen Pucker, RN Outcome: Adequate for Discharge 02/03/2020 1524 by Luellen Pucker, RN Outcome: Progressing 02/03/2020 1128 by Luellen Pucker, RN Outcome: Progressing   Problem: Health Behavior/Discharge Planning: Goal: Ability to manage health-related needs will improve 02/03/2020 1658 by Luellen Pucker, RN Outcome: Adequate for Discharge 02/03/2020 1525 by Luellen Pucker, RN Outcome: Adequate for Discharge 02/03/2020 1524 by Luellen Pucker, RN Outcome: Progressing 02/03/2020 1128 by Luellen Pucker, RN Outcome: Progressing   Problem: Clinical Measurements: Goal: Ability to maintain clinical measurements within normal limits will improve 02/03/2020 1658 by Luellen Pucker, RN Outcome: Adequate for Discharge 02/03/2020 1525 by Luellen Pucker, RN Outcome: Adequate for Discharge Goal: Diagnostic test results will improve 02/03/2020 1658 by Luellen Pucker, RN Outcome: Adequate for Discharge 02/03/2020 1525 by Luellen Pucker, RN Outcome: Adequate for Discharge Goal: Cardiovascular complication will be avoided 02/03/2020 1658 by Luellen Pucker, RN Outcome: Adequate for Discharge 02/03/2020 1525 by Luellen Pucker, RN Outcome: Adequate for Discharge   Problem: Activity: Goal: Risk for activity intolerance will decrease 02/03/2020 1658 by Luellen Pucker, RN Outcome: Adequate for Discharge 02/03/2020 1525 by Luellen Pucker, RN Outcome: Adequate for Discharge   Problem: Nutrition: Goal: Adequate nutrition will be maintained 02/03/2020 1658 by Luellen Pucker, RN Outcome: Adequate for Discharge 02/03/2020 1525 by Luellen Pucker, RN Outcome: Adequate for Discharge 02/03/2020 1524 by Luellen Pucker, RN Outcome:  Progressing 02/03/2020 1128 by Luellen Pucker, RN Outcome: Not Progressing   Problem: Coping: Goal: Level of anxiety will decrease 02/03/2020 1658 by Luellen Pucker, RN Outcome: Adequate for Discharge 02/03/2020 1525 by Luellen Pucker, RN Outcome: Adequate for Discharge   Problem: Elimination: Goal: Will not experience complications related to bowel motility 02/03/2020 1658 by Luellen Pucker, RN Outcome: Adequate for Discharge 02/03/2020 1525 by Luellen Pucker, RN Outcome: Adequate for Discharge Goal: Will not experience complications related to urinary retention 02/03/2020 1658 by Luellen Pucker, RN Outcome: Adequate for Discharge 02/03/2020 1525 by Luellen Pucker, RN Outcome: Adequate for Discharge   Problem: Pain Managment: Goal: General experience of comfort will improve 02/03/2020 1658 by Luellen Pucker, RN Outcome: Adequate for Discharge 02/03/2020 1525 by Luellen Pucker, RN Outcome: Adequate for Discharge   Problem: Safety: Goal: Ability to remain free from injury will improve 02/03/2020 1658 by Luellen Pucker, RN Outcome: Adequate for Discharge 02/03/2020 1525 by Luellen Pucker, RN Outcome: Adequate for Discharge

## 2020-02-03 NOTE — Progress Notes (Signed)
Patient complained of not being able to urinate. Bladder Scan showed no urine in bladder.

## 2020-02-03 NOTE — Plan of Care (Signed)
  Problem: Education: Goal: Knowledge of General Education information will improve Description: Including pain rating scale, medication(s)/side effects and non-pharmacologic comfort measures 02/03/2020 1525 by Luellen Pucker, RN Outcome: Adequate for Discharge 02/03/2020 1524 by Luellen Pucker, RN Outcome: Progressing 02/03/2020 1128 by Luellen Pucker, RN Outcome: Progressing   Problem: Health Behavior/Discharge Planning: Goal: Ability to manage health-related needs will improve 02/03/2020 1525 by Luellen Pucker, RN Outcome: Adequate for Discharge 02/03/2020 1524 by Luellen Pucker, RN Outcome: Progressing 02/03/2020 1128 by Luellen Pucker, RN Outcome: Progressing   Problem: Clinical Measurements: Goal: Ability to maintain clinical measurements within normal limits will improve Outcome: Adequate for Discharge Goal: Diagnostic test results will improve Outcome: Adequate for Discharge Goal: Cardiovascular complication will be avoided Outcome: Adequate for Discharge   Problem: Activity: Goal: Risk for activity intolerance will decrease Outcome: Adequate for Discharge   Problem: Nutrition: Goal: Adequate nutrition will be maintained 02/03/2020 1525 by Luellen Pucker, RN Outcome: Adequate for Discharge 02/03/2020 1524 by Luellen Pucker, RN Outcome: Progressing 02/03/2020 1128 by Luellen Pucker, RN Outcome: Not Progressing   Problem: Coping: Goal: Level of anxiety will decrease Outcome: Adequate for Discharge   Problem: Elimination: Goal: Will not experience complications related to bowel motility Outcome: Adequate for Discharge Goal: Will not experience complications related to urinary retention Outcome: Adequate for Discharge   Problem: Pain Managment: Goal: General experience of comfort will improve Outcome: Adequate for Discharge   Problem: Safety: Goal: Ability to remain free from injury will improve Outcome: Adequate for Discharge

## 2020-02-03 NOTE — Progress Notes (Signed)
   Subjective: [name] states she is worried about not being able to urinate. Every time she tries to go, only a "dab" comes out. We discussed the bladder scan results and that it showed no retention. Discussed that we will obtain some labs today.   She is also hungry but has been unable to eat. We discussed the importance of small and frequent meals, as well as outpatient follow up with GI. [name] also endorses generalized weakness.   Contacted nurse later in afternoon, patient ate all her breakfast, including eggs and juice, nurse reported that it all came back up. She was not in the room when it happened however did see the emesis.   Objective:  Vital signs in last 24 hours: Vitals:   02/02/20 0459 02/02/20 1308 02/02/20 2022 02/03/20 0541  BP: 113/73 135/87 109/83 115/81  Pulse: 66 77 65 68  Resp: 12 16 16 16   Temp: 98 F (36.7 C) 97.7 F (36.5 C) 98.2 F (36.8 C) 97.7 F (36.5 C)  TempSrc: Oral Oral Oral Oral  SpO2: 97% 98% 99% 100%  Weight:    96.5 kg  Height:       General: Middle aged female, mild anxiety Cardiac: RRR, no m/r/g Pulmonary: CTABL, no wheezing, rhonchi, rales Abdomen: Soft, nontender, nondistended, normoactive bowel sounds   Assessment/Plan:  Active Problems:   Hematemesis   Abdominal pain, chronic, right upper quadrant   Nausea and vomiting in adult  Felicia Sutton is a 39 year old female with a past medical history significant for GERDand frequent ED visits over the past year for abd pain, chest pain, n/v and normal findings on EGD.   Gastroparesis: Hematemesis: Presented with nausea, vomiting, and hematemesis.  GI consulted for hematemesis, underwent EGD that showed no acute findings.  Gastric emptying study showed delayed gastric emptying.  Patient currently now on a gastroparesis diet and started on Reglan.  Still was not able to tolerate her breakfast this morning despite getting antiemetics.  We will try Reglan and Haldol.  If patient is  able to tolerate p.o. intake then we may be able to discharge her today, however she still requiring IV antiemetics may monitor overnight.   Plan: - Continue PPI - Reglan 5 mg TID WC - Zofran 4mg , oral. Every 6 hours, PRN. - Tylenol 650 mg, oral. Every 6 hours, PRN. - Goal to discharge if tolerating diet  Breast Mass Patient presented with small, firm, mobile left breast mass. Mass is present in the upper midline border of left breast. No erythema, drainage, nipple discharge, changes in coloration/texture, or axillary lymphadenopathy.  Plan: - Patient will need outpatient follow up outpatient with Ob/Gyn for mammogram/ultrasound.  Decreased urination: Patient noted decreased urination during her admission, yesterday had an output of 300 cc.  Reports having only a "dab" of urine, when she urinates.  Bladder scan done this morning showed no urinary retention.  Abdominal exam benign.  Obtained a BMP that showed no change in her creatinine.  Could be secondary to decreased p.o. intake.    Plan: -Will monitor with strict ins and outs. -BMP in AM if patient stays  Prior to Admission Living Arrangement: Home Anticipated Discharge Location: Home Barriers to Discharge: Tolerating PO Dispo: Anticipated discharge in approximately 0-1 day(s).   24, MD 02/03/2020, 6:09 AM Pager: [phone]

## 2020-02-03 NOTE — Progress Notes (Signed)
Nsg Discharge Note  Admit Date:  01/31/2020 Discharge date: 02/03/2020   Madalynne Gutmann to be D/C'd Home per MD order.  AVS completed.   Patient/caregiver able to verbalize understanding.  Discharge Medication: Allergies as of 02/03/2020   No Known Allergies     Medication List    STOP taking these medications   potassium chloride SA 20 MEQ tablet Commonly known as: KLOR-CON     TAKE these medications   metoCLOPramide 5 MG tablet Commonly known as: REGLAN Take 1-2 tablets (5-10 mg total) by mouth 3 (three) times daily with meals. Use for 5-7 days during flares Notes to patient: Tonight, before dinner, 02/03/20.   pantoprazole 40 MG tablet Commonly known as: PROTONIX Take 1 tablet (40 mg total) by mouth daily. Notes to patient: Tomorrow, 02/04/20, 1 hr before meal or other medication.   promethazine 25 MG tablet Commonly known as: PHENERGAN Take 1 tablet (25 mg total) by mouth every 6 (six) hours as needed for nausea or vomiting.       Discharge Assessment: Vitals:   02/03/20 0856 02/03/20 1201  BP: (!) 93/59 109/74  Pulse: 68 71  Resp: 18 18  Temp: (!) 97.2 F (36.2 C) 98.2 F (36.8 C)  SpO2: 99% 97%   Skin clean, dry and intact without evidence of skin break down, no evidence of skin tears noted. IV catheter discontinued intact. Site without signs and symptoms of complications - no redness or edema noted at insertion site, patient denies c/o pain - only slight tenderness at site.  Dressing with slight pressure applied.  D/c Instructions-Education: Discharge instructions given to patient/family with verbalized understanding. D/c education completed with patient/family including follow up instructions, medication list, d/c activities limitations if indicated, with other d/c instructions as indicated by MD - patient able to verbalize understanding, all questions fully answered. Patient instructed to return to ED, call 911, or call MD for any changes in condition.   Patient escorted via WC, and D/C home via taxi.  Luellen Pucker, RN 02/03/2020 4:59 PM

## 2020-02-28 ENCOUNTER — Ambulatory Visit: Payer: Medicare Other | Admitting: Family Medicine

## 2021-07-05 ENCOUNTER — Ambulatory Visit (HOSPITAL_COMMUNITY)
Admission: AD | Admit: 2021-07-05 | Discharge: 2021-07-05 | Disposition: A | Discharge status: Home / Self Care | Payer: Medicare Other | Source: Home / Self Care | Attending: Psychiatry | Admitting: Psychiatry

## 2021-07-05 ENCOUNTER — Encounter (HOSPITAL_COMMUNITY): Payer: Self-pay

## 2021-07-05 ENCOUNTER — Emergency Department (HOSPITAL_COMMUNITY): Payer: Medicare Other

## 2021-07-05 ENCOUNTER — Other Ambulatory Visit: Payer: Self-pay

## 2021-07-05 ENCOUNTER — Other Ambulatory Visit (INDEPENDENT_AMBULATORY_CARE_PROVIDER_SITE_OTHER)
Admission: EM | Admit: 2021-07-05 | Discharge: 2021-07-05 | Disposition: A | Discharge status: Home / Self Care | Payer: Medicare Other | Source: Home / Self Care | Admitting: Urology

## 2021-07-05 ENCOUNTER — Emergency Department (HOSPITAL_COMMUNITY)
Admission: EM | Admit: 2021-07-05 | Discharge: 2021-07-05 | Disposition: A | Discharge status: Home / Self Care | Payer: Medicare Other | Attending: Emergency Medicine | Admitting: Emergency Medicine

## 2021-07-05 DIAGNOSIS — F1010 Alcohol abuse, uncomplicated: Secondary | ICD-10-CM | POA: Insufficient documentation

## 2021-07-05 DIAGNOSIS — F1190 Opioid use, unspecified, uncomplicated: Secondary | ICD-10-CM | POA: Insufficient documentation

## 2021-07-05 DIAGNOSIS — F1994 Other psychoactive substance use, unspecified with psychoactive substance-induced mood disorder: Secondary | ICD-10-CM | POA: Insufficient documentation

## 2021-07-05 DIAGNOSIS — F32A Depression, unspecified: Secondary | ICD-10-CM | POA: Insufficient documentation

## 2021-07-05 DIAGNOSIS — F1114 Opioid abuse with opioid-induced mood disorder: Secondary | ICD-10-CM

## 2021-07-05 DIAGNOSIS — F419 Anxiety disorder, unspecified: Secondary | ICD-10-CM | POA: Insufficient documentation

## 2021-07-05 DIAGNOSIS — Z560 Unemployment, unspecified: Secondary | ICD-10-CM | POA: Insufficient documentation

## 2021-07-05 DIAGNOSIS — F1290 Cannabis use, unspecified, uncomplicated: Secondary | ICD-10-CM | POA: Insufficient documentation

## 2021-07-05 DIAGNOSIS — F1910 Other psychoactive substance abuse, uncomplicated: Secondary | ICD-10-CM | POA: Diagnosis present

## 2021-07-05 DIAGNOSIS — U071 COVID-19: Secondary | ICD-10-CM | POA: Diagnosis not present

## 2021-07-05 DIAGNOSIS — F4321 Adjustment disorder with depressed mood: Secondary | ICD-10-CM | POA: Insufficient documentation

## 2021-07-05 DIAGNOSIS — F1210 Cannabis abuse, uncomplicated: Secondary | ICD-10-CM | POA: Insufficient documentation

## 2021-07-05 DIAGNOSIS — R1011 Right upper quadrant pain: Secondary | ICD-10-CM | POA: Diagnosis present

## 2021-07-05 DIAGNOSIS — Z5321 Procedure and treatment not carried out due to patient leaving prior to being seen by health care provider: Secondary | ICD-10-CM | POA: Insufficient documentation

## 2021-07-05 DIAGNOSIS — F11188 Opioid abuse with other opioid-induced disorder: Secondary | ICD-10-CM | POA: Insufficient documentation

## 2021-07-05 DIAGNOSIS — Z634 Disappearance and death of family member: Secondary | ICD-10-CM | POA: Insufficient documentation

## 2021-07-05 LAB — CBC WITH DIFFERENTIAL/PLATELET
Abs Immature Granulocytes: 0.02 10*3/uL (ref 0.00–0.07)
Basophils Absolute: 0 10*3/uL (ref 0.0–0.1)
Basophils Relative: 1 %
Eosinophils Absolute: 0.1 10*3/uL (ref 0.0–0.5)
Eosinophils Relative: 1 %
HCT: 35.6 % — ABNORMAL LOW (ref 36.0–46.0)
Hemoglobin: 11.5 g/dL — ABNORMAL LOW (ref 12.0–15.0)
Immature Granulocytes: 0 %
Lymphocytes Relative: 40 %
Lymphs Abs: 2.4 10*3/uL (ref 0.7–4.0)
MCH: 28.7 pg (ref 26.0–34.0)
MCHC: 32.3 g/dL (ref 30.0–36.0)
MCV: 88.8 fL (ref 80.0–100.0)
Monocytes Absolute: 0.5 10*3/uL (ref 0.1–1.0)
Monocytes Relative: 8 %
Neutro Abs: 3.1 10*3/uL (ref 1.7–7.7)
Neutrophils Relative %: 50 %
Platelets: 341 10*3/uL (ref 150–400)
RBC: 4.01 MIL/uL (ref 3.87–5.11)
RDW: 14.6 % (ref 11.5–15.5)
WBC: 6.1 10*3/uL (ref 4.0–10.5)
nRBC: 0 % (ref 0.0–0.2)

## 2021-07-05 LAB — RESP PANEL BY RT-PCR (FLU A&B, COVID) ARPGX2
Influenza A by PCR: NEGATIVE
Influenza B by PCR: NEGATIVE
SARS Coronavirus 2 by RT PCR: POSITIVE — AB

## 2021-07-05 LAB — POCT URINE DRUG SCREEN - MANUAL ENTRY (I-SCREEN)
POC Amphetamine UR: NOT DETECTED
POC Buprenorphine (BUP): NOT DETECTED
POC Cocaine UR: NOT DETECTED
POC Marijuana UR: POSITIVE — AB
POC Methadone UR: NOT DETECTED
POC Methamphetamine UR: NOT DETECTED
POC Morphine: NOT DETECTED
POC Oxazepam (BZO): NOT DETECTED
POC Oxycodone UR: NOT DETECTED
POC Secobarbital (BAR): NOT DETECTED

## 2021-07-05 LAB — COMPREHENSIVE METABOLIC PANEL
ALT: 14 U/L (ref 0–44)
AST: 12 U/L — ABNORMAL LOW (ref 15–41)
Albumin: 3.4 g/dL — ABNORMAL LOW (ref 3.5–5.0)
Alkaline Phosphatase: 84 U/L (ref 38–126)
Anion gap: 7 (ref 5–15)
BUN: 8 mg/dL (ref 6–20)
CO2: 28 mmol/L (ref 22–32)
Calcium: 9.4 mg/dL (ref 8.9–10.3)
Chloride: 106 mmol/L (ref 98–111)
Creatinine, Ser: 0.52 mg/dL (ref 0.44–1.00)
GFR, Estimated: >60 mL/min (ref >60)
Glucose, Bld: 98 mg/dL (ref 70–99)
Potassium: 3.6 mmol/L (ref 3.5–5.1)
Sodium: 141 mmol/L (ref 135–145)
Total Bilirubin: 0.5 mg/dL (ref 0.3–1.2)
Total Protein: 6.7 g/dL (ref 6.5–8.1)

## 2021-07-05 LAB — POC SARS CORONAVIRUS 2 AG: SARSCOV2ONAVIRUS 2 AG: NEGATIVE

## 2021-07-05 LAB — POCT PREGNANCY, URINE: Preg Test, Ur: NEGATIVE

## 2021-07-05 LAB — POC SARS CORONAVIRUS 2 AG -  ED: SARS Coronavirus 2 Ag: NEGATIVE

## 2021-07-05 LAB — ETHANOL: Alcohol, Ethyl (B): <10 mg/dL (ref <10)

## 2021-07-05 MED ORDER — LOPERAMIDE HCL 2 MG PO CAPS: 2.0000 mg | ORAL_CAPSULE | ORAL | Status: DC | PRN

## 2021-07-05 MED ORDER — ADULT MULTIVITAMIN W/MINERALS CH
1.0000 | ORAL_TABLET | Freq: Every day | ORAL | Status: DC
  Administered 2021-07-05: 1 via ORAL
  Filled 2021-07-05: qty 1

## 2021-07-05 MED ORDER — ACETAMINOPHEN 325 MG PO TABS: 650.0000 mg | ORAL_TABLET | Freq: Four times a day (QID) | ORAL | Status: DC | PRN

## 2021-07-05 MED ORDER — HYDROXYZINE HCL 25 MG PO TABS: 25.0000 mg | ORAL_TABLET | Freq: Three times a day (TID) | ORAL | Status: DC | PRN

## 2021-07-05 MED ORDER — THIAMINE HCL 100 MG/ML IJ SOLN
100.0000 mg | Freq: Once | INTRAMUSCULAR | Status: AC
  Administered 2021-07-05: 100 mg via INTRAMUSCULAR
  Filled 2021-07-05: qty 2

## 2021-07-05 MED ORDER — MAGNESIUM HYDROXIDE 400 MG/5ML PO SUSP: 30.0000 mL | Freq: Every day | ORAL | Status: DC | PRN

## 2021-07-05 MED ORDER — HYDROXYZINE HCL 25 MG PO TABS: 25.0000 mg | ORAL_TABLET | Freq: Four times a day (QID) | ORAL | Status: DC | PRN

## 2021-07-05 MED ORDER — ALUM & MAG HYDROXIDE-SIMETH 200-200-20 MG/5ML PO SUSP: 30.0000 mL | ORAL | Status: DC | PRN

## 2021-07-05 MED ORDER — THIAMINE HCL 100 MG PO TABS: 100.0000 mg | ORAL_TABLET | Freq: Every day | ORAL | Status: DC

## 2021-07-05 MED ORDER — ONDANSETRON 4 MG PO TBDP: 4.0000 mg | ORAL_TABLET | Freq: Four times a day (QID) | ORAL | Status: DC | PRN

## 2021-07-05 MED ORDER — TRAZODONE HCL 50 MG PO TABS: 50.0000 mg | ORAL_TABLET | Freq: Every evening | ORAL | Status: DC | PRN

## 2021-07-05 MED ORDER — LORAZEPAM 1 MG PO TABS: 1.0000 mg | ORAL_TABLET | Freq: Four times a day (QID) | ORAL | Status: DC | PRN

## 2021-07-05 NOTE — ED Triage Notes (Signed)
Pt presents with c/o abdominal pain with associated vomiting for 2 days.

## 2021-07-05 NOTE — ED Notes (Signed)
Informed Pt that she was covid positive and moved her odd the unit per provider orders. Safety maintained and will continue to monitor.
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
## 2021-07-05 NOTE — ED Notes (Signed)
Discharge instructions provided and Pt stated understanding. Pt alert, orient and ambulatory prior to d/c from facility. Personal belongings returned from locker number 68. Safe transport called for transportation services to [facility] parking lot to pick up personal car. Pt escorted to the sally port. Safety maintained.
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
## 2021-07-05 NOTE — ED Notes (Signed)
[facility] called and stated that Pt was positive for Covid. Provider being updated.
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
## 2021-07-05 NOTE — Discharge Instructions (Addendum)
Take all medications as prescribed. Keep all follow-up appointments as scheduled.  Do not consume alcohol or use illegal drugs while on prescription medications. Report any adverse effects from your medications to your primary care provider promptly.  In the event of recurrent symptoms or worsening symptoms, call 911, a crisis hotline, or go to the nearest emergency department for evaluation.   

## 2021-07-05 NOTE — ED Provider Notes (Signed)
Emergency Medicine Provider Triage Evaluation Note  Felicia Sutton , a 40 y.o. female  was evaluated in triage.  Pt complains of ruq abd pain that started 2 days ago.  Review of Systems  Positive: Abd pain, nv Negative: fever  Physical Exam  BP 112/74 (BP Location: Left Arm)   Pulse 97   Temp 98.3 F (36.8 C) (Oral)   Resp 18   SpO2 99%  Gen:   Awake, no distress   Resp:  Normal effort  MSK:   Moves extremities without difficulty  Other:  Ruq ttp  Medical Decision Making  Medically screening exam initiated at 12:37 PM.  Appropriate orders placed.  Felicia Sutton was informed that the remainder of the evaluation will be completed by another provider, this initial triage assessment does not replace that evaluation, and the importance of remaining in the ED until their evaluation is complete.     Karrie Meres, PA-C 07/05/21 1237    Terald Sleeper, MD 07/05/21 [phone]

## 2021-09-19 IMAGING — DX DG CHEST 1V PORT
1 series · 1 of 1 positions shown · non-contrast
Comparison: 07/31/2018

CLINICAL DATA: Chest pain

EXAM:
PORTABLE CHEST 1 VIEW

[chest ap]
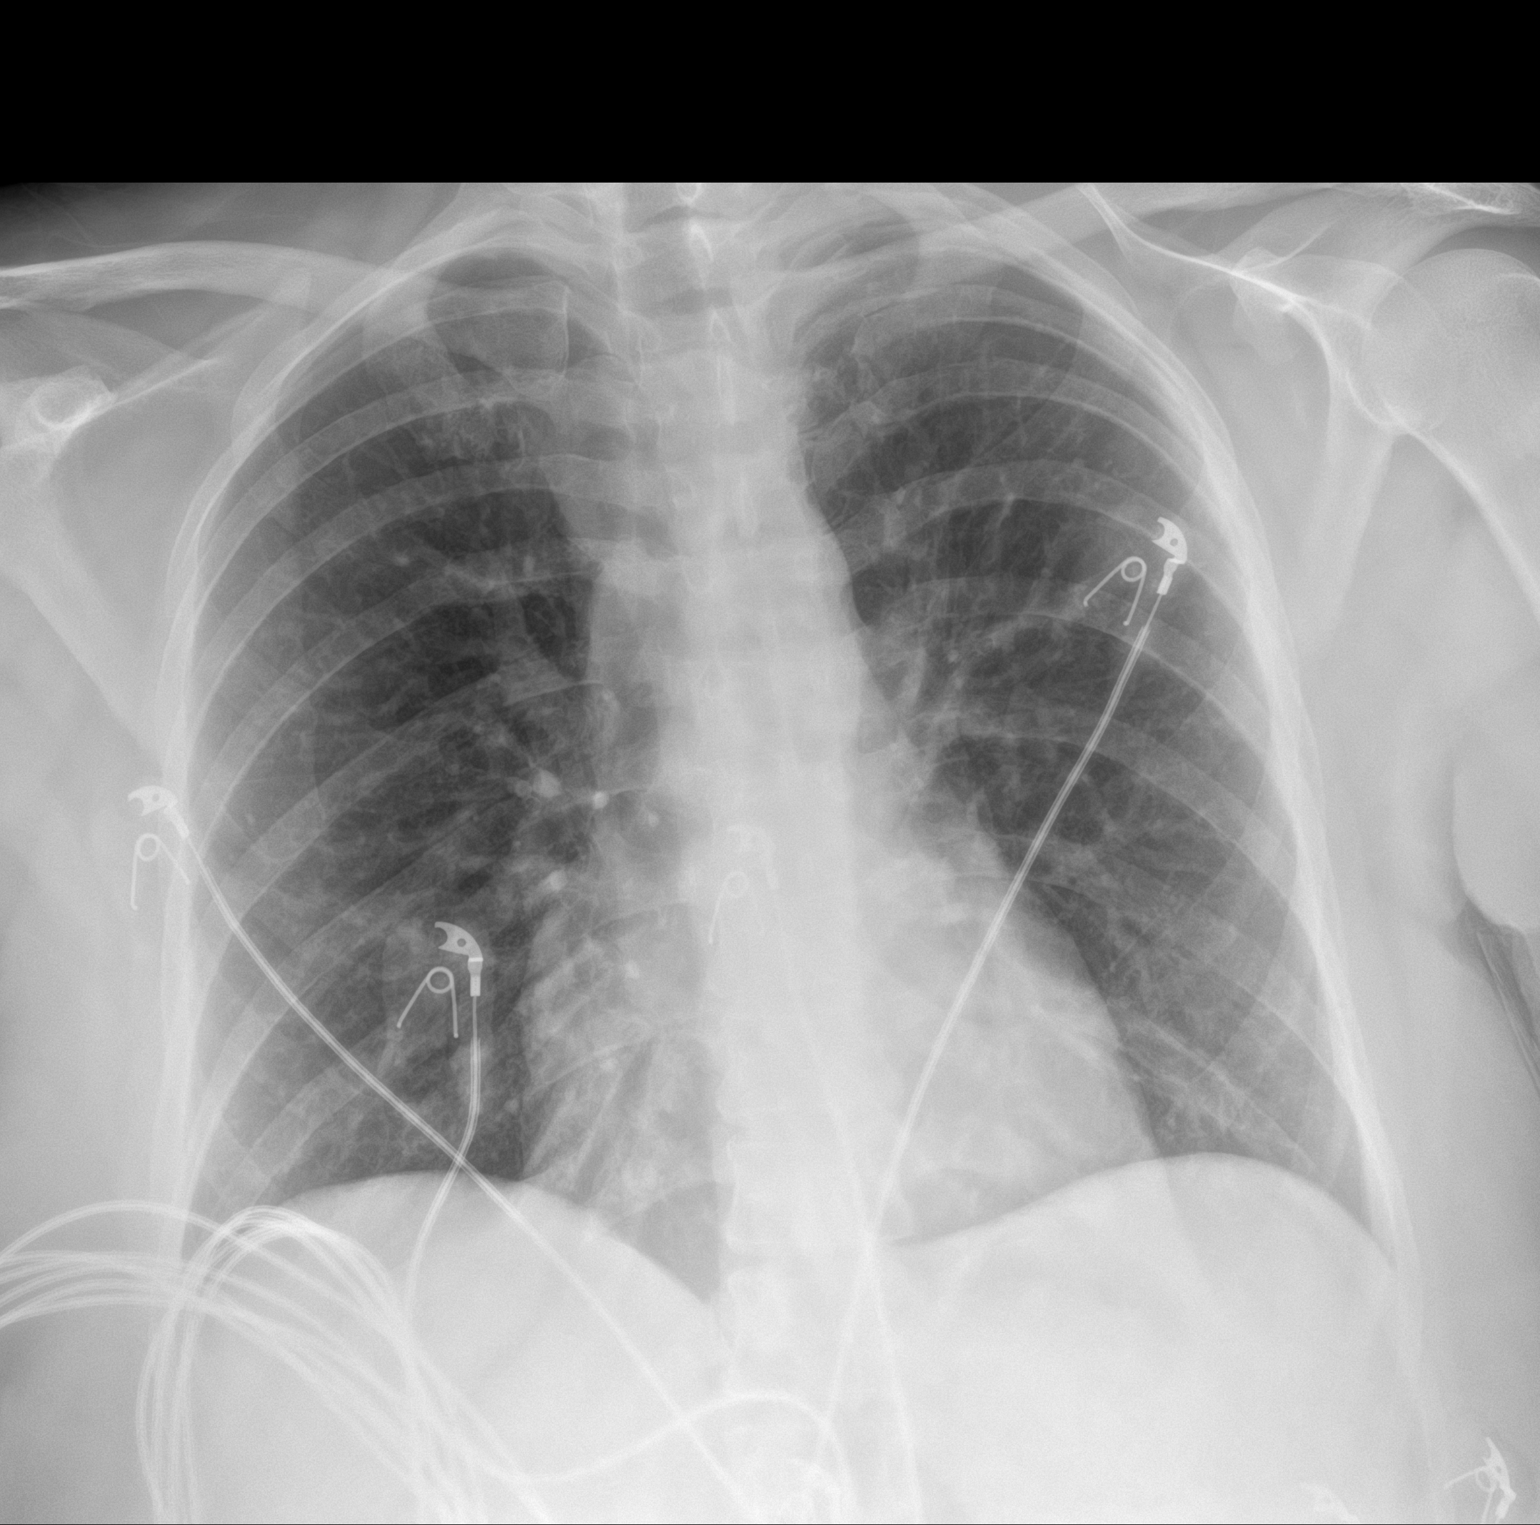

[1 of 1 positions shown; findings below may reference images not displayed]

FINDINGS: Heart and mediastinal contours are within normal limits. No focal
opacities or effusions. No acute bony abnormality.
IMPRESSION: No active disease.

## 2021-11-29 ENCOUNTER — Emergency Department (HOSPITAL_COMMUNITY)
Admission: EM | Admit: 2021-11-29 | Discharge: 2021-11-30 | Disposition: A | Discharge status: Other Acute Inpatient Hospital | Payer: Medicare Other | Source: Home / Self Care | Attending: Emergency Medicine | Admitting: Emergency Medicine

## 2021-11-29 ENCOUNTER — Encounter (HOSPITAL_COMMUNITY): Payer: Self-pay | Admitting: *Deleted

## 2021-11-29 DIAGNOSIS — R45851 Suicidal ideations: Secondary | ICD-10-CM | POA: Insufficient documentation

## 2021-11-29 DIAGNOSIS — F1994 Other psychoactive substance use, unspecified with psychoactive substance-induced mood disorder: Secondary | ICD-10-CM | POA: Insufficient documentation

## 2021-11-29 DIAGNOSIS — Z20822 Contact with and (suspected) exposure to covid-19: Secondary | ICD-10-CM | POA: Insufficient documentation

## 2021-11-29 DIAGNOSIS — F332 Major depressive disorder, recurrent severe without psychotic features: Secondary | ICD-10-CM | POA: Diagnosis not present

## 2021-11-29 LAB — RESP PANEL BY RT-PCR (FLU A&B, COVID) ARPGX2
Influenza A by PCR: NEGATIVE
Influenza B by PCR: NEGATIVE
SARS Coronavirus 2 by RT PCR: NEGATIVE

## 2021-11-29 LAB — RAPID URINE DRUG SCREEN, HOSP PERFORMED
Amphetamines: NOT DETECTED
Barbiturates: NOT DETECTED
Benzodiazepines: NOT DETECTED
Cocaine: NOT DETECTED
Opiates: NOT DETECTED
Tetrahydrocannabinol: POSITIVE — AB

## 2021-11-29 LAB — COMPREHENSIVE METABOLIC PANEL
ALT: 62 U/L — ABNORMAL HIGH (ref 0–44)
AST: 48 U/L — ABNORMAL HIGH (ref 15–41)
Albumin: 3.5 g/dL (ref 3.5–5.0)
Alkaline Phosphatase: 77 U/L (ref 38–126)
Anion gap: 7 (ref 5–15)
BUN: 11 mg/dL (ref 6–20)
CO2: 28 mmol/L (ref 22–32)
Calcium: 8.8 mg/dL — ABNORMAL LOW (ref 8.9–10.3)
Chloride: 106 mmol/L (ref 98–111)
Creatinine, Ser: 0.69 mg/dL (ref 0.44–1.00)
GFR, Estimated: >60 mL/min (ref >60)
Glucose, Bld: 84 mg/dL (ref 70–99)
Potassium: 3.3 mmol/L — ABNORMAL LOW (ref 3.5–5.1)
Sodium: 141 mmol/L (ref 135–145)
Total Bilirubin: 0.6 mg/dL (ref 0.3–1.2)
Total Protein: 7.3 g/dL (ref 6.5–8.1)

## 2021-11-29 LAB — CBC
HCT: 39.3 % (ref 36.0–46.0)
Hemoglobin: 12.7 g/dL (ref 12.0–15.0)
MCH: 28.7 pg (ref 26.0–34.0)
MCHC: 32.3 g/dL (ref 30.0–36.0)
MCV: 88.7 fL (ref 80.0–100.0)
Platelets: 245 10*3/uL (ref 150–400)
RBC: 4.43 MIL/uL (ref 3.87–5.11)
RDW: 11.8 % (ref 11.5–15.5)
WBC: 6.7 10*3/uL (ref 4.0–10.5)
nRBC: 0 % (ref 0.0–0.2)

## 2021-11-29 LAB — SALICYLATE LEVEL: Salicylate Lvl: <7 mg/dL — ABNORMAL LOW (ref 7.0–30.0)

## 2021-11-29 LAB — I-STAT BETA HCG BLOOD, ED (MC, WL, AP ONLY): I-stat hCG, quantitative: <5 m[IU]/mL (ref <5)

## 2021-11-29 LAB — ACETAMINOPHEN LEVEL: Acetaminophen (Tylenol), Serum: <10 ug/mL — ABNORMAL LOW (ref 10–30)

## 2021-11-29 LAB — ETHANOL: Alcohol, Ethyl (B): <10 mg/dL (ref <10)

## 2021-11-30 ENCOUNTER — Inpatient Hospital Stay (HOSPITAL_COMMUNITY)
Admission: AD | Admit: 2021-11-30 | Discharge: 2021-12-08 | DRG: 885 | Disposition: A | Discharge status: Home / Self Care | Payer: Medicare Other | Source: Intra-hospital | Attending: Psychiatry | Admitting: Psychiatry

## 2021-11-30 ENCOUNTER — Encounter (HOSPITAL_COMMUNITY): Payer: Self-pay | Admitting: Psychiatry

## 2021-11-30 ENCOUNTER — Other Ambulatory Visit: Payer: Self-pay

## 2021-11-30 DIAGNOSIS — F333 Major depressive disorder, recurrent, severe with psychotic symptoms: Secondary | ICD-10-CM | POA: Diagnosis not present

## 2021-11-30 DIAGNOSIS — G4700 Insomnia, unspecified: Secondary | ICD-10-CM | POA: Diagnosis present

## 2021-11-30 DIAGNOSIS — Z5900 Homelessness unspecified: Secondary | ICD-10-CM

## 2021-11-30 DIAGNOSIS — F332 Major depressive disorder, recurrent severe without psychotic features: Secondary | ICD-10-CM | POA: Diagnosis present

## 2021-11-30 DIAGNOSIS — K5900 Constipation, unspecified: Secondary | ICD-10-CM | POA: Diagnosis present

## 2021-11-30 DIAGNOSIS — Z9151 Personal history of suicidal behavior: Secondary | ICD-10-CM

## 2021-11-30 DIAGNOSIS — F1410 Cocaine abuse, uncomplicated: Secondary | ICD-10-CM | POA: Diagnosis present

## 2021-11-30 DIAGNOSIS — F1210 Cannabis abuse, uncomplicated: Secondary | ICD-10-CM | POA: Diagnosis present

## 2021-11-30 DIAGNOSIS — R45851 Suicidal ideations: Secondary | ICD-10-CM | POA: Diagnosis present

## 2021-11-30 DIAGNOSIS — E876 Hypokalemia: Secondary | ICD-10-CM | POA: Diagnosis present

## 2021-11-30 DIAGNOSIS — Z20822 Contact with and (suspected) exposure to covid-19: Secondary | ICD-10-CM | POA: Diagnosis present

## 2021-11-30 DIAGNOSIS — F17210 Nicotine dependence, cigarettes, uncomplicated: Secondary | ICD-10-CM | POA: Diagnosis present

## 2021-11-30 DIAGNOSIS — Z811 Family history of alcohol abuse and dependence: Secondary | ICD-10-CM

## 2021-11-30 DIAGNOSIS — F411 Generalized anxiety disorder: Secondary | ICD-10-CM

## 2021-11-30 DIAGNOSIS — F1590 Other stimulant use, unspecified, uncomplicated: Secondary | ICD-10-CM

## 2021-11-30 MED ORDER — ENSURE ENLIVE PO LIQD
237.0000 mL | Freq: Two times a day (BID) | ORAL | Status: DC
  Administered 2021-11-30 – 2021-12-08 (×16): 237 mL via ORAL
  Filled 2021-11-30 (×20): qty 237

## 2021-11-30 MED ORDER — MAGNESIUM HYDROXIDE 400 MG/5ML PO SUSP
30.0000 mL | Freq: Every day | ORAL | Status: DC | PRN
  Administered 2021-12-03 – 2021-12-04 (×2): 30 mL via ORAL
  Filled 2021-11-30 (×2): qty 30

## 2021-11-30 MED ORDER — FLUOXETINE HCL 10 MG PO CAPS
10.0000 mg | ORAL_CAPSULE | Freq: Every day | ORAL | Status: DC
  Administered 2021-12-01: 10 mg via ORAL
  Filled 2021-11-30 (×4): qty 1

## 2021-11-30 MED ORDER — HYDROXYZINE HCL 25 MG PO TABS
25.0000 mg | ORAL_TABLET | Freq: Three times a day (TID) | ORAL | Status: DC | PRN
  Administered 2021-11-30 – 2021-12-08 (×16): 25 mg via ORAL
  Filled 2021-11-30 (×16): qty 1
  Filled 2021-11-30: qty 20

## 2021-11-30 MED ORDER — TRAZODONE HCL 50 MG PO TABS
50.0000 mg | ORAL_TABLET | Freq: Every evening | ORAL | Status: DC | PRN
  Administered 2021-11-30 – 2021-12-05 (×6): 50 mg via ORAL
  Filled 2021-11-30: qty 14
  Filled 2021-11-30 (×6): qty 1

## 2021-11-30 MED ORDER — LORAZEPAM 1 MG PO TABS: 1.0000 mg | ORAL_TABLET | Freq: Three times a day (TID) | ORAL | Status: DC | PRN

## 2021-11-30 MED ORDER — FLUOXETINE HCL 10 MG PO CAPS
10.0000 mg | ORAL_CAPSULE | Freq: Every day | ORAL | Status: DC
  Administered 2021-11-30: 10 mg via ORAL
  Filled 2021-11-30: qty 1

## 2021-11-30 MED ORDER — ACETAMINOPHEN 325 MG PO TABS: 650.0000 mg | ORAL_TABLET | Freq: Four times a day (QID) | ORAL | Status: DC | PRN

## 2021-11-30 MED ORDER — ALUM & MAG HYDROXIDE-SIMETH 200-200-20 MG/5ML PO SUSP
30.0000 mL | ORAL | Status: DC | PRN
  Administered 2021-12-01: 30 mL via ORAL

## 2021-11-30 MED ORDER — HYDROXYZINE HCL 25 MG PO TABS: 25.0000 mg | ORAL_TABLET | Freq: Three times a day (TID) | ORAL | Status: DC | PRN

## 2021-12-01 ENCOUNTER — Encounter (HOSPITAL_COMMUNITY): Payer: Self-pay

## 2021-12-01 DIAGNOSIS — F332 Major depressive disorder, recurrent severe without psychotic features: Secondary | ICD-10-CM

## 2021-12-02 DIAGNOSIS — F332 Major depressive disorder, recurrent severe without psychotic features: Secondary | ICD-10-CM | POA: Diagnosis not present

## 2021-12-02 LAB — COMPREHENSIVE METABOLIC PANEL
ALT: 51 U/L — ABNORMAL HIGH (ref 0–44)
AST: 19 U/L (ref 15–41)
Albumin: 3.4 g/dL — ABNORMAL LOW (ref 3.5–5.0)
Alkaline Phosphatase: 75 U/L (ref 38–126)
Anion gap: 6 (ref 5–15)
BUN: 14 mg/dL (ref 6–20)
CO2: 30 mmol/L (ref 22–32)
Calcium: 9 mg/dL (ref 8.9–10.3)
Chloride: 105 mmol/L (ref 98–111)
Creatinine, Ser: 0.66 mg/dL (ref 0.44–1.00)
GFR, Estimated: >60 mL/min (ref >60)
Glucose, Bld: 97 mg/dL (ref 70–99)
Potassium: 3.8 mmol/L (ref 3.5–5.1)
Sodium: 141 mmol/L (ref 135–145)
Total Bilirubin: 0.3 mg/dL (ref 0.3–1.2)
Total Protein: 7.1 g/dL (ref 6.5–8.1)

## 2021-12-02 LAB — LIPID PANEL
Cholesterol: 144 mg/dL (ref 0–200)
HDL: 36 mg/dL — ABNORMAL LOW (ref >40)
LDL Cholesterol: 84 mg/dL (ref 0–99)
Total CHOL/HDL Ratio: 4 RATIO
Triglycerides: 121 mg/dL (ref <150)
VLDL: 24 mg/dL (ref 0–40)

## 2021-12-02 LAB — TSH: TSH: 2.061 u[IU]/mL (ref 0.350–4.500)

## 2021-12-02 MED ORDER — FLUOXETINE HCL 20 MG PO CAPS
20.0000 mg | ORAL_CAPSULE | Freq: Every day | ORAL | Status: DC
  Administered 2021-12-02 – 2021-12-04 (×3): 20 mg via ORAL
  Filled 2021-12-02 (×6): qty 1

## 2021-12-02 NOTE — [facility] Counselor (Signed)
CSW provided the Pt with a packet that contains information including shelter and housing resources, free and reduced price food information, clothing resources, crisis center information, a GoodRX card, and suicide prevention information.   

## 2021-12-02 NOTE — Progress Notes (Signed)
[facility] MD Progress Note ? ?12/02/2021 3:43 PM ?Felicia Sutton  ?MRN:  [id number] ?Subjective:   ?Felicia Sutton is a 41 yr old female who presented on 3/11 to [facility] for SI (plan to OD on Tylenol) with recent OD.  PPHx is significant for Depression, Substance Abuse (THC, Cocaine, and past EtOH/IV Heroine), and one prior Suicide Attempt via OD 2/23. ? ? ?Case was discussed in the multidisciplinary team. MAR was reviewed and patient was compliant with medications.  She received PRN Maalox and Trazodone yesterday. ? ? ?Psychiatric Team made the following recommendations yesterday: ?-Increase Prozac to 20 mg today for depression/anxiety ?-Start Remeron 15 mg QHS for depression/anxiety/sleep/appetite ?-Redraw CMP tomorrow morning ? ? ? ?On interview today patient reports her sleep has improved some.  She reports her appetite has had some improvement and she was able to eat breakfast.  She reports no SI, HI, or AVH.  She reports no Parnoia, Ideas of Reference, or other First Rank symptoms. ? ?She reports no issues with her medications.  She reports that she has had some improvement in her anxiety that she has not had a panic attack since her episode yesterday morning.  Encouraged her to go to groups and work on developing and refining her coping skills.  She reports no other concerns at present. ? ? ?Principal Problem: MDD (major depressive disorder), recurrent episode, severe (HCC) ?Diagnosis: Principal Problem: ?  MDD (major depressive disorder), recurrent episode, severe (HCC) ? ?Total Time spent with patient:  ?I personally spent 30 minutes on the unit in direct patient care. The direct patient care time included face-to-face time with the patient, reviewing the patient's chart, communicating with other professionals, and coordinating care. Greater than 50% of this time was spent in counseling or coordinating care with the patient regarding goals of hospitalization, psycho-education, and discharge planning  needs. ? ? ?Past Psychiatric History: Depression, Substance Abuse (THC, Cocaine, and past EtOH/IV Heroine), and one prior Suicide Attempt via OD 2/23. ? ?Past Medical History: History reviewed. No pertinent past medical history. History reviewed. No pertinent surgical history. ?Family History: History reviewed. No pertinent family history. ?Family Psychiatric  History: Reports no known Diagnosis', Substance Use, or Suicides ?Social History:  ?Social History  ? ?Substance and Sexual Activity  ?Alcohol Use Not Currently  ?   ?Social History  ? ?Substance and Sexual Activity  ?Drug Use Not Currently  ?  ?Social History  ? ?Socioeconomic History  ? Marital status: Single  ?  Spouse name: Not on file  ? Number of children: Not on file  ? Years of education: Not on file  ? Highest education level: Not on file  ?Occupational History  ? Not on file  ?Tobacco Use  ? Smoking status: Never  ? Smokeless tobacco: Never  ?Vaping Use  ? Vaping Use: Never used  ?Substance and Sexual Activity  ? Alcohol use: Not Currently  ? Drug use: Not Currently  ? Sexual activity: Not on file  ?Other Topics Concern  ? Not on file  ?Social History Narrative  ? Not on file  ? ?Social Determinants of Health  ? ?Financial Resource Strain: Not on file  ?Food Insecurity: Not on file  ?Transportation Needs: Not on file  ?Physical Activity: Not on file  ?Stress: Not on file  ?Social Connections: Not on file  ? ?Additional Social History:  ?  ?  ?  ?  ?  ?  ?  ?  ?  ?  ?  ? ?Sleep: Fair  and improving ? ?Appetite:  Fair and improving ? ?Current Medications: ?Current Facility-Administered Medications  ?Medication Dose Route Frequency Provider Last Rate Last Admin  ? acetaminophen (TYLENOL) tablet 650 mg  650 mg Oral Q6H PRN Chales Abrahams, NP      ? alum & mag hydroxide-simeth (MAALOX/MYLANTA) 200-200-20 MG/5ML suspension 30 mL  30 mL Oral Q4H PRN Ophelia Shoulder E, NP   30 mL at 12/01/21 1258  ? feeding supplement (ENSURE ENLIVE / ENSURE PLUS) liquid 237  mL  237 mL Oral BID BM Hill, Shelbie Hutching, MD   237 mL at 12/02/21 1519  ? FLUoxetine (PROZAC) capsule 20 mg  20 mg Oral Daily Lauro Franklin, MD   20 mg at 12/02/21 3825  ? hydrOXYzine (ATARAX) tablet 25 mg  25 mg Oral TID PRN Chales Abrahams, NP   25 mg at 12/02/21 1120  ? LORazepam (ATIVAN) tablet 1 mg  1 mg Oral TID PRN Chales Abrahams, NP      ? magnesium hydroxide (MILK OF MAGNESIA) suspension 30 mL  30 mL Oral Daily PRN Chales Abrahams, NP      ? traZODone (DESYREL) tablet 50 mg  50 mg Oral QHS PRN Chales Abrahams, NP   50 mg at 12/01/21 2137  ? ? ?Lab Results:  ?Results for orders placed or performed during the hospital encounter of 11/30/21 (from the past 48 hour(s))  ?Comprehensive metabolic panel     Status: Abnormal  ? Collection Time: 12/02/21  6:36 AM  ?Result Value Ref Range  ? Sodium 141 135 - 145 mmol/L  ? Potassium 3.8 3.5 - 5.1 mmol/L  ? Chloride 105 98 - 111 mmol/L  ? CO2 30 22 - 32 mmol/L  ? Glucose, Bld 97 70 - 99 mg/dL  ?  Comment: Glucose reference range applies only to samples taken after fasting for at least 8 hours.  ? BUN 14 6 - 20 mg/dL  ? Creatinine, Ser 0.66 0.44 - 1.00 mg/dL  ? Calcium 9.0 8.9 - 10.3 mg/dL  ? Total Protein 7.1 6.5 - 8.1 g/dL  ? Albumin 3.4 (L) 3.5 - 5.0 g/dL  ? AST 19 15 - 41 U/L  ? ALT 51 (H) 0 - 44 U/L  ? Alkaline Phosphatase 75 38 - 126 U/L  ? Total Bilirubin 0.3 0.3 - 1.2 mg/dL  ? GFR, Estimated >60 >60 mL/min  ?  Comment: (NOTE) ?Calculated using the CKD-EPI Creatinine Equation (2021) ?  ? Anion gap 6 5 - 15  ?  Comment: Performed at [facility], [address], [place], [place] [address]  ?TSH     Status: None  ? Collection Time: 12/02/21  6:36 AM  ?Result Value Ref Range  ? TSH 2.061 0.350 - 4.500 uIU/mL  ?  Comment: Performed by a 3rd Generation assay with a functional sensitivity of <=0.01 uIU/mL. ?Performed at [facility], [address], [place], [place] [address] ?  ?Lipid panel     Status: Abnormal  ?  Collection Time: 12/02/21  6:36 AM  ?Result Value Ref Range  ? Cholesterol 144 0 - 200 mg/dL  ? Triglycerides 121 <150 mg/dL  ? HDL 36 (L) >40 mg/dL  ? Total CHOL/HDL Ratio 4.0 RATIO  ? VLDL 24 0 - 40 mg/dL  ? LDL Cholesterol 84 0 - 99 mg/dL  ?  Comment:        ?Total Cholesterol/HDL:CHD Risk ?Coronary Heart Disease Risk Table ?  Men   Women ? 1/2 Average Risk   3.4   3.3 ? Average Risk       5.0   4.4 ? 2 X Average Risk   9.6   7.1 ? 3 X Average Risk  23.4   11.0 ?       ?Use the calculated Patient Ratio ?above and the CHD Risk Table ?to determine the patient's CHD Risk. ?       ?ATP III CLASSIFICATION (LDL): ? <100     mg/dL   Optimal ? 161-096100-129  mg/dL   Near or Above ?                   Optimal ? 130-159  mg/dL   Borderline ? 160-189  mg/dL   High ? >045>190     mg/dL   Very High ?Performed at [facility], [address] [place], [place] [address] ?  ? ? ?Blood Alcohol level:  ?Lab Results  ?Component Value Date  ? ETH <10 11/29/2021  ? ? ?Metabolic Disorder Labs: ?No results found for: HGBA1C, MPG ?No results found for: PROLACTIN ?Lab Results  ?Component Value Date  ? CHOL 144 12/02/2021  ? TRIG 121 12/02/2021  ? HDL 36 (L) 12/02/2021  ? CHOLHDL 4.0 12/02/2021  ? VLDL 24 12/02/2021  ? LDLCALC 84 12/02/2021  ? ? ?Physical Findings: ?AIMS:  , ,  ,  ,    ?CIWA:  CIWA-Ar Total: 1 ?COWS:    ? ?Musculoskeletal: ?Strength & Muscle Tone: within normal limits ?Gait & Station: normal ?Patient leans: N/A ? ?Psychiatric Specialty Exam: ? ?Presentation  ?General Appearance: Appropriate for Environment; Casual (missing several teeth) ? ?Eye Contact:Good ? ?Speech:Clear and Coherent; Normal Rate ? ?Speech Volume:Normal ? ?Handedness:Right ? ? ?Mood and Affect  ?Mood:Depressed ? ?Affect:Congruent; Depressed ? ? ?Thought Process  ?Thought Processes:Coherent; Goal Directed ? ?Descriptions of Associations:Intact ? ?Orientation:Full (Time, Place and Person) ? ?Thought Content:Logical ?No SI, HI, or  AVH. No Paranoia, Ideas of Reference, or First Rank symptoms. ? ?History of Schizophrenia/Schizoaffective disorder:No ? ?Duration of Psychotic Symptoms:No data recorded ?Hallucinations:Hallucinations: None ? ?Ideas o

## 2021-12-02 NOTE — Group Note (Signed)
Recreation Therapy Group Note ? ? ?Group Topic:Animal Assisted Therapy   ?Group Date: 12/02/2021 ?Start Time: 1430 ?End Time: 1515 ?Facilitators: Caroll Rancher, LRT,CTRS ?Location: [address] ? ? ?Animal-Assisted Activity (AAA) Program Checklist/Progress Note ?Patient Eligibility Criteria Checklist & Daily Group note for Rec Tx Intervention ? ? ?AAA/T Program Assumption of Risk Form signed by Patient/ or Parent Legal Guardian YES ? ?Patient is free of allergies or severe asthma  YES ? ?Patient reports no fear of animals YES ? ?Patient reports no history of cruelty to animals YES ? ?Patient understands their participation is voluntary YES ? ?Patient washes hands before animal contact YES ? ?Patient washes hands after animal contact YES ? ? ?Group Description: Patients provided opportunity to interact with trained and credentialed [employer] [facility] dog and the community volunteer/dog handler. Patients practiced appropriate animal interaction and were educated on dog safety outside of the hospital in common community settings. Patients were allowed to use dog toys and other items to practice commands, engage the dog in play, and/or complete routine aspects of animal care.  ? ?Education: Charity fundraiser, Health visitor, Communication & Social Skills  ? ? ?Affect/Mood: Appropriate ?  ?Participation Level: Engaged ?  ? ?Clinical Observations/Individualized Feedback:  Pt attended and participated in group session.  Pt asked questions of volunteer and shared stories of pets back home. ? ? ?Plan: Continue to engage patient in RT group sessions 2-3x/week. ? ? ?Caroll Rancher, LRT,CTRS ?12/02/2021 3:56 PM ?

## 2021-12-02 NOTE — Group Note (Signed)
Date:  12/02/2021 ?Time:  4:38 PM ? ?Group Topic/Focus:  ?Self Care:   The focus of this group is to help patients understand the importance of self-care in order to improve or restore emotional, physical, spiritual, interpersonal, and financial health. ? ? ? ?Participation Level:  Did Not Attend ? ?Participation Quality:   ? ?Affect:   ? ?Cognitive:   ? ?Insight:  ? ?Engagement in Group:   ? ?Modes of Intervention:   ? ?Additional Comments:   ? ?Jerrye Beavers ?12/02/2021, 4:38 PM ? ?

## 2021-12-02 NOTE — Progress Notes (Signed)
Adult Psychoeducational Group Note ? ?Date:  12/02/2021 ?Time:  10:53 PM ? ?Group Topic/Focus:  ?Wrap-Up Group:   The focus of this group is to help patients review their daily goal of treatment and discuss progress on daily workbooks. ? ?Participation Level:  Active ? ?Participation Quality:  Appropriate ? ?Affect:  Appropriate ? ?Cognitive:  Appropriate ? ?Insight: Appropriate ? ?Engagement in Group:  Engaged ? ?Modes of Intervention:  Discussion ? ?Additional Comments:  Pt stated his goal for today was to focus on his treatment plan. Pt stated he accomplished his goal today. Pt stated he talked with his doctor and social worker about his care today. Pt rated his overall day a 7 out of 10. Pt stated he made no calls today. Pt stated he felt better about himself today. Pt stated he was able to attend all meals. Pt stated he took all medications provided today. Pt stated he attend all groups held today. Pt stated his appetite was poor today. Pt rated sleep last night was poor. Pt stated the goal tonight was to get some rest. Pt stated he had no physical pain tonight. Pt deny visual hallucinations and auditory issues tonight. Pt denies thoughts of harming others. Pt stated she was having thoughts of harming herself. Pt stated she could contract for safety. Pt nurse was updated on the situation. Pt stated he would alert staff if anything changed ? ?Candy Sledge ?12/02/2021, 10:53 PM ?

## 2021-12-03 LAB — HEMOGLOBIN A1C
Hgb A1c MFr Bld: 5.4 % (ref 4.8–5.6)
Mean Plasma Glucose: 108 mg/dL

## 2021-12-03 MED ORDER — BUSPIRONE HCL 10 MG PO TABS
10.0000 mg | ORAL_TABLET | Freq: Two times a day (BID) | ORAL | Status: DC
  Administered 2021-12-03 – 2021-12-08 (×11): 10 mg via ORAL
  Filled 2021-12-03: qty 1
  Filled 2021-12-03: qty 28
  Filled 2021-12-03: qty 1
  Filled 2021-12-03 (×4): qty 28
  Filled 2021-12-03: qty 1
  Filled 2021-12-03: qty 28
  Filled 2021-12-03: qty 1
  Filled 2021-12-03: qty 28
  Filled 2021-12-03: qty 1
  Filled 2021-12-03 (×3): qty 28
  Filled 2021-12-03 (×2): qty 1

## 2021-12-03 NOTE — BHH Counselor (Signed)
CSW spoke with the Pt who states that she would like to attend a 30-day treatment center.  She states that she has Medicaid and Medicare for medical insurance.  CSW explained the options for the Pt due to the Pt's insurance.  CSW asked the Pt where she will live if there are no immediate beds available.  The Pt states that she will go to a shelter or the [facility].  CSW will place these referrals and will inform the providers . ?

## 2021-12-03 NOTE — Progress Notes (Signed)
?   12/03/21 0000  ?Psych Admission Type (Psych Patients Only)  ?Admission Status Voluntary  ?Psychosocial Assessment  ?Patient Complaints Anxiety  ?Eye Contact Fair  ?Facial Expression Other (Comment) ?[facility])  ?Affect Appropriate to circumstance  ?Speech Logical/coherent  ?Interaction Assertive  ?Motor Activity Slow  ?Appearance/Hygiene Unremarkable  ?Behavior Characteristics Cooperative  ?Mood Depressed;Anxious  ?Thought Process  ?Coherency WDL  ?Content WDL  ?Delusions None reported or observed  ?Perception WDL  ?Hallucination None reported or observed  ?Judgment Impaired  ?Confusion None  ?Danger to Self  ?Current suicidal ideation? Passive  ?Agreement Not to Harm Self Yes  ?Description of Agreement VERBAL CONTRACT FOR SAFETY  ?Danger to Others  ?Danger to Others None reported or observed  ? ? ?

## 2021-12-03 NOTE — Progress Notes (Signed)
Chaplain engaged in a conversation about her grief and the loss of her son. Chaplain provided listening support as she shared about her son and about how she wants to heal.  She has limited support and is in an on/off relationship that is currently off.  She is focusing on herself right now and wants to cope without using drugs.  She knows that her son would want that for her.  Chaplain provided emotional and spiritual support. ? ?[employer], Bcc ?Pager, [phone] ?4:42 PM ? ?

## 2021-12-03 NOTE — Group Note (Signed)
Date:  12/03/2021 ?Time:  10:00 AM ? ?Group Topic/Focus:  ?Orientation:   The focus of this group is to educate the patient on the purpose and policies of crisis stabilization and provide a format to answer questions about their admission.  The group details unit policies and expectations of patients while admitted. ? ? ? ?Participation Level:  Did Not Attend ? ?Participation Quality:   ? ?Affect:   ? ?Cognitive:   ? ?Insight:  ? ?Engagement in Group:   ? ?Modes of Intervention:   ? ?Additional Comments:   ?Jaquita Rector ?12/03/2021, 10:00 AM ? ?

## 2021-12-03 NOTE — Progress Notes (Signed)
[facility] MD Progress Note ? ?12/03/2021 3:36 PM ?[name]  ?MRN:  [id number] ?Subjective:   ?Felicia Sutton is a 41 yr old female who presented on 3/11 to [facility] for SI (plan to OD on Tylenol) with recent OD.  PPHx is significant for Depression, Substance Abuse (THC, Cocaine, and past EtOH/IV Heroine), and one prior Suicide Attempt via OD 2/23. ? ? ?Case was discussed in the multidisciplinary team. MAR was reviewed and patient was compliant with medications.  She received PRN Maalox and Trazodone yesterday. ? ? ?Psychiatric Team made the following recommendations yesterday: ?-Increase Prozac to 20 mg today for depression/anxiety ?-Start Remeron 15 mg QHS for depression/anxiety/sleep/appetite ? ? ? ?On interview today patient reports her sleep is improved and that she is able to sleep all night long and it was a restful sleep.  She reports that her appetite has improved and she was able to eat all of her meals.  She reports no SI, HI, or AVH.  She reports no Parnoia, Ideas of Reference, or other First Rank symptoms. ? ?She reports no side effects with her medications.  She reports that her anxiety is still significantly elevated she rates as a 7 out of 10.  She reports no panic attacks in the last 24 hours.  Discussed with her starting BuSpar to help in the short-term while her antidepressants reach therapeutic level which can take 4 to 6 weeks and she was agreeable with this. ? ?Asked her how she was handling her birthday today.  She states that she is sad that her son is not here but that she is happy to still be here for her birthday.  She states that she got significant benefit from a group led by the chaplain the other day and asked if it would be possible to have a one-on-one visit with the chaplain.  Discussed that I would place this consult order to have her be seen today. ? ?Discussed her substance use with her and how this can significantly impact her mood and decrease the effectiveness of her  medications.  She states that she would be interested in substance abuse information.  Discussed that social work would be able to provide more information but briefly discussed the different options of residential, IOP, and outpatient groups. ? ?She reports no other concerns at present. ? ? ?Principal Problem: MDD (major depressive disorder), recurrent episode, severe (HCC) ?Diagnosis: Principal Problem: ?  MDD (major depressive disorder), recurrent episode, severe (HCC) ? ?Total Time spent with patient:  ?I personally spent 30 minutes on the unit in direct patient care. The direct patient care time included face-to-face time with the patient, reviewing the patient's chart, communicating with other professionals, and coordinating care. Greater than 50% of this time was spent in counseling or coordinating care with the patient regarding goals of hospitalization, psycho-education, and discharge planning needs. ? ? ?Past Psychiatric History: Depression, Substance Abuse (THC, Cocaine, and past EtOH/IV Heroine), and one prior Suicide Attempt via OD 2/23. ? ?Past Medical History: History reviewed. No pertinent past medical history. History reviewed. No pertinent surgical history. ?Family History: History reviewed. No pertinent family history. ?Family Psychiatric  History: Reports no known Diagnosis', Substance Use, or Suicides ?Social History:  ?Social History  ? ?Substance and Sexual Activity  ?Alcohol Use Not Currently  ?   ?Social History  ? ?Substance and Sexual Activity  ?Drug Use Not Currently  ?  ?Social History  ? ?Socioeconomic History  ? Marital status: Single  ?  Spouse name:  Not on file  ? Number of children: Not on file  ? Years of education: Not on file  ? Highest education level: Not on file  ?Occupational History  ? Not on file  ?Tobacco Use  ? Smoking status: Never  ? Smokeless tobacco: Never  ?Vaping Use  ? Vaping Use: Never used  ?Substance and Sexual Activity  ? Alcohol use: Not Currently  ? Drug use:  Not Currently  ? Sexual activity: Not on file  ?Other Topics Concern  ? Not on file  ?Social History Narrative  ? Not on file  ? ?Social Determinants of Health  ? ?Financial Resource Strain: Not on file  ?Food Insecurity: Not on file  ?Transportation Needs: Not on file  ?Physical Activity: Not on file  ?Stress: Not on file  ?Social Connections: Not on file  ? ?Additional Social History:  ?  ?  ?  ?  ?  ?  ?  ?  ?  ?  ?  ? ?Sleep: Good ? ?Appetite:  Good  ? ?Current Medications: ?Current Facility-Administered Medications  ?Medication Dose Route Frequency Provider Last Rate Last Admin  ? acetaminophen (TYLENOL) tablet 650 mg  650 mg Oral Q6H PRN Chales Abrahams, NP      ? alum & mag hydroxide-simeth (MAALOX/MYLANTA) 200-200-20 MG/5ML suspension 30 mL  30 mL Oral Q4H PRN Ophelia Shoulder E, NP   30 mL at 12/01/21 1258  ? busPIRone (BUSPAR) tablet 10 mg  10 mg Oral BID Lauro Franklin, MD   10 mg at 12/03/21 1252  ? feeding supplement (ENSURE ENLIVE / ENSURE PLUS) liquid 237 mL  237 mL Oral BID BM Hill, Shelbie Hutching, MD   237 mL at 12/03/21 1433  ? FLUoxetine (PROZAC) capsule 20 mg  20 mg Oral Daily Lauro Franklin, MD   20 mg at 12/03/21 1610  ? hydrOXYzine (ATARAX) tablet 25 mg  25 mg Oral TID PRN Chales Abrahams, NP   25 mg at 12/03/21 1033  ? LORazepam (ATIVAN) tablet 1 mg  1 mg Oral TID PRN Chales Abrahams, NP      ? magnesium hydroxide (MILK OF MAGNESIA) suspension 30 mL  30 mL Oral Daily PRN Chales Abrahams, NP      ? traZODone (DESYREL) tablet 50 mg  50 mg Oral QHS PRN Chales Abrahams, NP   50 mg at 12/02/21 2109  ? ? ?Lab Results:  ?Results for orders placed or performed during the hospital encounter of 11/30/21 (from the past 48 hour(s))  ?Comprehensive metabolic panel     Status: Abnormal  ? Collection Time: 12/02/21  6:36 AM  ?Result Value Ref Range  ? Sodium 141 135 - 145 mmol/L  ? Potassium 3.8 3.5 - 5.1 mmol/L  ? Chloride 105 98 - 111 mmol/L  ? CO2 30 22 - 32 mmol/L  ? Glucose, Bld 97 70 - 99  mg/dL  ?  Comment: Glucose reference range applies only to samples taken after fasting for at least 8 hours.  ? BUN 14 6 - 20 mg/dL  ? Creatinine, Ser 0.66 0.44 - 1.00 mg/dL  ? Calcium 9.0 8.9 - 10.3 mg/dL  ? Total Protein 7.1 6.5 - 8.1 g/dL  ? Albumin 3.4 (L) 3.5 - 5.0 g/dL  ? AST 19 15 - 41 U/L  ? ALT 51 (H) 0 - 44 U/L  ? Alkaline Phosphatase 75 38 - 126 U/L  ? Total Bilirubin 0.3 0.3 - 1.2 mg/dL  ?  GFR, Estimated >60 >60 mL/min  ?  Comment: (NOTE) ?Calculated using the CKD-EPI Creatinine Equation (2021) ?  ? Anion gap 6 5 - 15  ?  Comment: Performed at [facility], [address] [place], [place] [address]  ?Hemoglobin A1c     Status: None  ? Collection Time: 12/02/21  6:36 AM  ?Result Value Ref Range  ? Hgb A1c MFr Bld 5.4 4.8 - 5.6 %  ?  Comment: (NOTE) ?        Prediabetes: 5.7 - 6.4 ?        Diabetes: >6.4 ?        Glycemic control for adults with diabetes: <7.0 ?  ? Mean Plasma Glucose 108 mg/dL  ?  Comment: (NOTE) ?Performed At: [facility] ?[address] [place], [place] [id number] ?Jolene SchimkeNagendra Sanjai MD XB:[id number]:[phone] ?  ?TSH     Status: None  ? Collection Time: 12/02/21  6:36 AM  ?Result Value Ref Range  ? TSH 2.061 0.350 - 4.500 uIU/mL  ?  Comment: Performed by a 3rd Generation assay with a functional sensitivity of <=0.01 uIU/mL. ?Performed at [facility], [address] [place], [place] [address] ?  ?Lipid panel     Status: Abnormal  ? Collection Time: 12/02/21  6:36 AM  ?Result Value Ref Range  ? Cholesterol 144 0 - 200 mg/dL  ? Triglycerides 121 <150 mg/dL  ? HDL 36 (L) >40 mg/dL  ? Total CHOL/HDL Ratio 4.0 RATIO  ? VLDL 24 0 - 40 mg/dL  ? LDL Cholesterol 84 0 - 99 mg/dL  ?  Comment:        ?Total Cholesterol/HDL:CHD Risk ?Coronary Heart Disease Risk Table ?                    Men   Women ? 1/2 Average Risk   3.4   3.3 ? Average Risk       5.0   4.4 ? 2 X Average Risk   9.6   7.1 ? 3 X Average Risk  23.4   11.0 ?       ?Use the calculated Patient  Ratio ?above and the CHD Risk Table ?to determine the patient's CHD Risk. ?       ?ATP III CLASSIFICATION (LDL): ? <100     mg/dL   Optimal ? [id number]  mg/dL   Near or Above ?                   Optimal ? 130-159  mg/

## 2021-12-03 NOTE — Group Note (Signed)
LCSW Group Therapy Note ? ?Group Date: 12/03/2021 ?Start Time: 1300 ?End Time: 1400 ? ? ?Type of Therapy and Topic:  Group Therapy: Anger Cues and Responses ? ?Participation Level:  Active ? ? ?Description of Group:   ?In this group, patients learned how to recognize the physical, cognitive, emotional, and behavioral responses they have to anger-provoking situations.  They identified a recent time they became angry and how they reacted.  They analyzed how their reaction was possibly beneficial and how it was possibly unhelpful.  The group discussed a variety of healthier coping skills that could help with such a situation in the future.  Focus was placed on how helpful it is to recognize the underlying emotions to our anger, because working on those can lead to a more permanent solution as well as our ability to focus on the important rather than the urgent. ? ?Therapeutic Goals: ?Patients will remember their last incident of anger and how they felt emotionally and physically, what their thoughts were at the time, and how they behaved. ?Patients will identify how their behavior at that time worked for them, as well as how it worked against them. ?Patients will explore possible new behaviors to use in future anger situations. ?Patients will learn that anger itself is normal and cannot be eliminated, and that healthier reactions can assist with resolving conflict rather than worsening situations. ? ?Summary of Patient Progress:  Felicia Sutton was active during the group. She shared a recent occurrence wherein feeling like she is not believed led to anger. She demonstrated good insight into the subject matter, was respectful of peers, and participated throughout the entire session. Patient identified listening to music as a way to calm down.  ? ?Therapeutic Modalities:   ?Cognitive Behavioral Therapy ? ? ? ?Vaunda Gutterman E Caroll Weinheimer, LCSW ?12/03/2021  1:59 PM   ? ?

## 2021-12-03 NOTE — Group Note (Signed)
Recreation Therapy Group Note ? ? ?Group Topic:Stress Management  ?Group Date: 12/03/2021 ?Start Time: 0930 ?End Time: [id number] ?Facilitators: Victorino Sparrow, LRT,CTRS ?Location: [place] ? ? ?Goal Area(s) Addresses:  ?Patient will actively participate in stress management techniques presented during session.  ?Patient will successfully identify benefit of practicing stress management post d/c.  ?  ?Group Description: Guided Imagery. LRT provided education, instruction, and demonstration on practice of visualization via guided imagery. Patient was asked to participate in the technique introduced during session. LRT debriefed including topics of mindfulness, stress management and specific scenarios each patient could use these techniques. Patients were given suggestions of ways to access scripts post d/c and encouraged to explore Youtube and other apps available on smartphones, tablets, and computers. ? ? ?Affect/Mood: Appropriate ?  ?Participation Level: Active ?  ?Participation Quality: Independent ?  ?Behavior: Attentive  ?  ?Speech/Thought Process: Focused ?  ?Insight: Good ?  ?Judgement: Good ?  ?Modes of Intervention: Script, Petra Kuba Sounds ?  ?Patient Response to Interventions:  Attentive ?  ?Education Outcome: ? Acknowledges education and In group clarification offered   ? ?Clinical Observations/Individualized Feedback: Pt attended and participated in group session.  ?  ? ?Plan: Continue to engage patient in RT group sessions 2-3x/week. ? ? ?Victorino Sparrow, LRT,CTRS ?12/03/2021 11:25 AM ?

## 2021-12-03 NOTE — Progress Notes (Signed)
Pt denies SI/HI/AVH and verbally agrees to approach staff if these become apparent or before harming themselves/others. Rates depression 6/10. Rates anxiety 7/10. Rates pain 0/10. Pt was a little more sad today due to today being her birthday but she has been out of her room for most of the day. MD got pt a cake and she was able to have some. Pt seemed to enjoy and lift her spirit. Scheduled medications administered to pt, per MD orders. RN provided support and encouragement to pt. Q15 min safety checks implemented and continued. Pt safe on the unit. RN will continue to monitor and intervene as needed.  ? 12/03/21 0807  ?Psych Admission Type (Psych Patients Only)  ?Admission Status Voluntary  ?Psychosocial Assessment  ?Patient Complaints Anxiety;Depression  ?Eye Contact Fair  ?Facial Expression Anxious;Animated  ?Affect Anxious;Sad  ?Speech Logical/coherent  ?Interaction Assertive  ?Motor Activity Other (Comment) ?(WDL)  ?Appearance/Hygiene Unremarkable  ?Behavior Characteristics Cooperative;Anxious  ?Mood Anxious;Depressed;Sad  ?Thought Process  ?Coherency WDL  ?Content WDL  ?Delusions None reported or observed  ?Perception WDL  ?Hallucination None reported or observed  ?Judgment Impaired  ?Confusion None  ?Danger to Self  ?Current suicidal ideation? Denies  ?Danger to Others  ?Danger to Others None reported or observed  ? ? ?

## 2021-12-04 MED ORDER — SIMETHICONE 80 MG PO CHEW
80.0000 mg | CHEWABLE_TABLET | Freq: Once | ORAL | Status: AC
  Administered 2021-12-04: 80 mg via ORAL
  Filled 2021-12-04 (×2): qty 1

## 2021-12-04 MED ORDER — BISACODYL 5 MG PO TBEC
5.0000 mg | DELAYED_RELEASE_TABLET | Freq: Once | ORAL | Status: AC
  Administered 2021-12-04: 5 mg via ORAL
  Filled 2021-12-04 (×2): qty 1

## 2021-12-04 MED ORDER — MIRTAZAPINE 7.5 MG PO TABS
7.5000 mg | ORAL_TABLET | Freq: Every day | ORAL | Status: DC
  Administered 2021-12-04 – 2021-12-07 (×4): 7.5 mg via ORAL
  Filled 2021-12-04: qty 14
  Filled 2021-12-04: qty 1
  Filled 2021-12-04 (×3): qty 14
  Filled 2021-12-04: qty 1
  Filled 2021-12-04: qty 14

## 2021-12-04 MED ORDER — FLUOXETINE HCL 20 MG PO CAPS
30.0000 mg | ORAL_CAPSULE | Freq: Every day | ORAL | Status: DC
  Administered 2021-12-05 – 2021-12-08 (×4): 30 mg via ORAL
  Filled 2021-12-04 (×6): qty 1

## 2021-12-04 NOTE — BHH Counselor (Signed)
CSW spoke with Sharyn Lull at [facility] who asked for the Pt's social security number.  CSW got this number for the Pt and provided it to [place].  CSW was then informed that the Pt's name is Felicia Sutton and that she receives Medicaid and Medicare.  Sharyn Lull states that the Pt cannot attend [facility] if she receive Medicare.  CSW provided the Pt with the phone number to [facility] to complete an intake.  ?

## 2021-12-04 NOTE — Progress Notes (Signed)
?   12/04/21 1955  ?Psych Admission Type (Psych Patients Only)  ?Admission Status Voluntary  ?Psychosocial Assessment  ?Patient Complaints Anxiety;Depression;Other (Comment) ?(GI issues)  ?Eye Contact Brief  ?Facial Expression Anxious  ?Affect Anxious  ?Speech Logical/coherent  ?Interaction Assertive  ?Motor Activity Other (Comment) ?(wnl)  ?Appearance/Hygiene Unremarkable  ?Behavior Characteristics Cooperative;Anxious  ?Mood Depressed;Anxious  ?Thought Process  ?Coherency WDL  ?Content WDL  ?Delusions None reported or observed  ?Perception WDL  ?Hallucination None reported or observed  ?Judgment WDL  ?Confusion None  ?Danger to Self  ?Current suicidal ideation? Denies  ?Danger to Others  ?Danger to Others None reported or observed  ? ?Pt seen in dayroom. Pt denies SI, HI, AVH. Rates pain 7/10 d/t constipation and bloating. Endorsing stomach cramps. Pt stated she had a bowel movement today but she was still feeling discomfort. Pt rates anxiety and depression both 7/10.  ?

## 2021-12-04 NOTE — Progress Notes (Signed)
[facility] MD Progress Note ? ?12/04/2021 12:52 PM ?[name]  ?MRN:  [id number] ?Subjective:   ?Felicia Sutton is a 41 yr old female who presented on 3/11 to [facility] for SI (plan to OD on Tylenol) with recent OD.  PPHx is significant for Depression, Substance Abuse (THC, Cocaine, and past EtOH/IV Heroine), and one prior Suicide Attempt via OD 2/23. ? ? ?Case was discussed in the multidisciplinary team. MAR was reviewed and patient was compliant with medications.  She received PRN Hydroxyzine, Milk of Mag, and Trazodone yesterday. ? ? ?Psychiatric Team made the following recommendations yesterday: ?-Increase Prozac to 20 mg today for depression/anxiety ?-Start Buspar 10 mg BID for anxiety ? ? ? ?On interview today patient reports her sleep has improved and was good last night.  She reports her appetite is still just ok.  She reports no SI, HI, or AVH.  She reports no Parnoia, Ideas of Reference, or other First Rank symptoms.  She reports no issues with her medications. ? ?She reports that her mood is improved and she has been able to laugh some.  She reports that her anxiety is still fairly high.  Discussed with her that given her continued issues with anxiety and appetite we could start Remeron.  She was agreeable to this.  She reports no other concerns at present.  ? ? ?Principal Problem: MDD (major depressive disorder), recurrent episode, severe (HCC) ?Diagnosis: Principal Problem: ?  MDD (major depressive disorder), recurrent episode, severe (HCC) ? ?Total Time spent with patient:  ?I personally spent 30 minutes on the unit in direct patient care. The direct patient care time included face-to-face time with the patient, reviewing the patient's chart, communicating with other professionals, and coordinating care. Greater than 50% of this time was spent in counseling or coordinating care with the patient regarding goals of hospitalization, psycho-education, and discharge planning needs. ? ? ?Past Psychiatric History:  Depression, Substance Abuse (THC, Cocaine, and past EtOH/IV Heroine), and one prior Suicide Attempt via OD 2/23. ? ?Past Medical History: History reviewed. No pertinent past medical history. History reviewed. No pertinent surgical history. ?Family History: History reviewed. No pertinent family history. ?Family Psychiatric  History: Reports no known Diagnosis', Substance Use, or Suicides ?Social History:  ?Social History  ? ?Substance and Sexual Activity  ?Alcohol Use Not Currently  ?   ?Social History  ? ?Substance and Sexual Activity  ?Drug Use Not Currently  ?  ?Social History  ? ?Socioeconomic History  ? Marital status: Single  ?  Spouse name: Not on file  ? Number of children: Not on file  ? Years of education: Not on file  ? Highest education level: Not on file  ?Occupational History  ? Not on file  ?Tobacco Use  ? Smoking status: Never  ? Smokeless tobacco: Never  ?Vaping Use  ? Vaping Use: Never used  ?Substance and Sexual Activity  ? Alcohol use: Not Currently  ? Drug use: Not Currently  ? Sexual activity: Not on file  ?Other Topics Concern  ? Not on file  ?Social History Narrative  ? Not on file  ? ?Social Determinants of Health  ? ?Financial Resource Strain: Not on file  ?Food Insecurity: Not on file  ?Transportation Needs: Not on file  ?Physical Activity: Not on file  ?Stress: Not on file  ?Social Connections: Not on file  ? ?Additional Social History:  ?  ?  ?  ?  ?  ?  ?  ?  ?  ?  ?  ? ?  Sleep: Good ? ?Appetite:  Good  ? ?Current Medications: ?Current Facility-Administered Medications  ?Medication Dose Route Frequency Provider Last Rate Last Admin  ? acetaminophen (TYLENOL) tablet 650 mg  650 mg Oral Q6H PRN Chales Abrahams, NP      ? alum & mag hydroxide-simeth (MAALOX/MYLANTA) 200-200-20 MG/5ML suspension 30 mL  30 mL Oral Q4H PRN Ophelia Shoulder E, NP   30 mL at 12/01/21 1258  ? busPIRone (BUSPAR) tablet 10 mg  10 mg Oral BID Lauro Franklin, MD   10 mg at 12/04/21 0810  ? feeding supplement  (ENSURE ENLIVE / ENSURE PLUS) liquid 237 mL  237 mL Oral BID BM Hill, Shelbie Hutching, MD   237 mL at 12/04/21 1111  ? FLUoxetine (PROZAC) capsule 20 mg  20 mg Oral Daily Lauro Franklin, MD   20 mg at 12/04/21 2542  ? hydrOXYzine (ATARAX) tablet 25 mg  25 mg Oral TID PRN Chales Abrahams, NP   25 mg at 12/04/21 7062  ? LORazepam (ATIVAN) tablet 1 mg  1 mg Oral TID PRN Chales Abrahams, NP      ? magnesium hydroxide (MILK OF MAGNESIA) suspension 30 mL  30 mL Oral Daily PRN Ophelia Shoulder E, NP   30 mL at 12/04/21 1250  ? mirtazapine (REMERON) tablet 7.5 mg  7.5 mg Oral QHS Lauro Franklin, MD      ? traZODone (DESYREL) tablet 50 mg  50 mg Oral QHS PRN Chales Abrahams, NP   50 mg at 12/03/21 2114  ? ? ?Lab Results:  ?No results found for this or any previous visit (from the past 48 hour(s)). ? ? ?Blood Alcohol level:  ?Lab Results  ?Component Value Date  ? ETH <10 11/29/2021  ? ? ?Metabolic Disorder Labs: ?Lab Results  ?Component Value Date  ? HGBA1C 5.4 12/02/2021  ? MPG 108 12/02/2021  ? ?No results found for: PROLACTIN ?Lab Results  ?Component Value Date  ? CHOL 144 12/02/2021  ? TRIG 121 12/02/2021  ? HDL 36 (L) 12/02/2021  ? CHOLHDL 4.0 12/02/2021  ? VLDL 24 12/02/2021  ? LDLCALC 84 12/02/2021  ? ? ?Physical Findings: ?AIMS: Facial and Oral Movements ?Muscles of Facial Expression: None, normal ?Lips and Perioral Area: None, normal ?Jaw: None, normal ?Tongue: None, normal,Extremity Movements ?Upper (arms, wrists, hands, fingers): None, normal ?Lower (legs, knees, ankles, toes): None, normal, Trunk Movements ?Neck, shoulders, hips: None, normal, Overall Severity ?Severity of abnormal movements (highest score from questions above): None, normal ?Incapacitation due to abnormal movements: None, normal ?Patient's awareness of abnormal movements (rate only patient's report): No Awareness, Dental Status ?Current problems with teeth and/or dentures?: No ?Does patient usually wear dentures?: No  ?CIWA:  CIWA-Ar  Total: 1 ?COWS:    ? ?Musculoskeletal: ?Strength & Muscle Tone: within normal limits ?Gait & Station: normal ?Patient leans: N/A ? ?Psychiatric Specialty Exam: ? ?Presentation  ?General Appearance: Appropriate for Environment; Casual (missing several teeth) ? ?Eye Contact:Good ? ?Speech:Clear and Coherent; Normal Rate ? ?Speech Volume:Normal ? ?Handedness:Right ? ? ?Mood and Affect  ?Mood:Anxious ("okay") ? ?Affect:Congruent ? ? ?Thought Process  ?Thought Processes:Coherent; Goal Directed ? ?Descriptions of Associations:Intact ? ?Orientation:Full (Time, Place and Person) ? ?Thought Content:Logical ?No SI, HI, or AVH. No Paranoia, Ideas of Reference, or First Rank symptoms. ? ?History of Schizophrenia/Schizoaffective disorder:No ? ?Duration of Psychotic Symptoms:No data recorded ?Hallucinations:Hallucinations: None ? ?Ideas of Reference:None ? ?Suicidal Thoughts:Suicidal Thoughts: No ? ?Homicidal Thoughts:Homicidal Thoughts: No ? ? ?Sensorium  ?Memory:Immediate  Fair; Recent Fair ? ?Judgment:Fair ? ?Insight:Fair ? ? ?Executive Functions  ?Concentration:Good ? ?Attention Span:Good ? ?Recall:Fair ? ?Fund of Knowledge:Fair ? ?Language:Fair ? ? ?Psychomotor Activity  ?Psychomotor Activity:Psychomotor Activity: Normal ? ? ?Assets  ?Assets:Desire for Improvement; Resilience ? ? ?Sleep  ?Sleep:Sleep: Good ?Number of Hours of Sleep: 7 ? ? ? ?Physical Exam: ?Physical Exam ?Vitals and nursing note reviewed.  ?Constitutional:   ?   General: She is not in acute distress. ?   Appearance: Normal appearance. She is obese. She is not ill-appearing or toxic-appearing.  ?HENT:  ?   Head: Normocephalic and atraumatic.  ?Pulmonary:  ?   Effort: Pulmonary effort is normal.  ?Musculoskeletal:     ?   General: Normal range of motion.  ?Neurological:  ?   General: No focal deficit present.  ?   Mental Status: She is alert.  ? ?Review of Systems  ?Respiratory:  Negative for cough and shortness of breath.   ?Cardiovascular:  Negative for chest  pain.  ?Gastrointestinal:  Negative for abdominal pain, constipation, diarrhea, nausea and vomiting.  ?Neurological:  Negative for dizziness, weakness and headaches.  ?Psychiatric/Behavioral:  Positive for

## 2021-12-04 NOTE — Progress Notes (Signed)
?   12/03/21 2000  ?Psych Admission Type (Psych Patients Only)  ?Admission Status Voluntary  ?Psychosocial Assessment  ?Patient Complaints Anxiety;Depression  ?Eye Contact Fair  ?Facial Expression Anxious  ?Affect Anxious  ?Speech Logical/coherent  ?Interaction Assertive  ?Motor Activity Other (Comment) ?(wnl)  ?Appearance/Hygiene Unremarkable  ?Behavior Characteristics Cooperative;Anxious  ?Mood Anxious;Pleasant;Depressed  ?Thought Process  ?Coherency WDL  ?Content WDL  ?Delusions None reported or observed  ?Perception WDL  ?Hallucination None reported or observed  ?Judgment Limited  ?Confusion None  ?Danger to Self  ?Current suicidal ideation? Denies  ?Self-Injurious Behavior No self-injurious ideation or behavior indicators observed or expressed   ?Danger to Others  ?Danger to Others None reported or observed  ? ?Pt seen in at nurse's station. Pt denies SI, HI, AVH and pain. Pt rates anxiety and depression both 7/10. Pt c/o constipation today. Says her LBM was 12/02/21. Pt endorses attending groups and eating at least half of her meals. ?

## 2021-12-04 NOTE — Progress Notes (Signed)
Pt denies SI/HI/AVH.  Pt says depression is low, but anxiety is high.  Prn anxiety medication given with fairly good results.  Pt is social and interactive on the unit.   ?

## 2021-12-04 NOTE — Progress Notes (Signed)
Pt still c/o constipation with stomach pain. Provider notified. Pt has been given MOM and dulcolax today. Says she had a small bowel movement this morning but still feeling bloated and pain. ?

## 2021-12-04 NOTE — BHH Counselor (Addendum)
CSW spoke with the Pt who states that she contacted [facility] and was denied because her Medicaid was "out of [place]".  CSW stated that [facility] had told her that her Medicaid was out of [facility].  CSW then contacted [facility] who states that the Medicaid is out of [facility].  They state that they cannot take her until this is changed to [facility].  ? ?CSW also contacted [facility] in [place] and was informed by the intake coordnator that there are no beds at this time and she is not sure when there will be any beds available.  CSW will look at other options available to the Pt.   ? ?CSW provided the Pt with information for [facility] in [place] and [facility] in [place].  The Pt states that she contacted [facility] and was given an Intake Appointment time at 2:00pm.  This will be completed by telephone.  CSW will follow up with the Pt tomorrow (12/05/21) to discuss the outcome of this Intake.  CSW also asked the Pt to contact [facility] as soon as possible as well.  ?

## 2021-12-04 NOTE — Progress Notes (Signed)
Adult Psychoeducational Group Note ? ?Date:  12/04/2021 ?Time:  8:24 PM ? ?Group Topic/Focus:  ?Wrap-Up Group:   The focus of this group is to help patients review their daily goal of treatment and discuss progress on daily workbooks. ? ?Participation Level:  Active ? ?Participation Quality:  Appropriate ? ?Affect:  Appropriate ? ?Cognitive:  Appropriate ? ?Insight: Appropriate ? ?Engagement in Group:  Engaged ? ?Modes of Intervention:  Discussion ? ?Additional Comments:  wrao up group ? ?Felicia Sutton ?12/04/2021, 8:24 PM ?

## 2021-12-04 NOTE — BHH Suicide Risk Assessment (Signed)
B[facility] INPATIENT:  Family/Significant Other Suicide Prevention Education ? ?Suicide Prevention Education:  ?Contact Attempts: Derek Jack [phone] (Partner) has been identified by the patient as the family member/significant other with whom the patient will be residing, and identified as the person(s) who will aid the patient in the event of a mental health crisis.  With written consent from the patient, two attempts were made to provide suicide prevention education, prior to and/or following the patient's discharge.  We were unsuccessful in providing suicide prevention education.  A suicide education pamphlet was given to the patient to share with family/significant other. ? ?Date and time of first attempt: 12/01/2021 at 1:50pm ?Date and time of second attempt: 12/04/2021 at 11:05am  ? ?There is no voicemail set up for this phone number.  CSW was unable to leave a voicemail for a return call.  ? ?Aram Beecham ?12/04/2021, 12:33 PM ?

## 2021-12-04 NOTE — BHH Counselor (Addendum)
CSW spoke with Felicia Sutton at [facility] who states that the Pt has been accepted to the center for an intake assessment on 12/05/2021 at [place] states that the Pt will need a 1 month perscription and 2 weeks of samples.  CSW will inform the providers of this information.  ?

## 2021-12-04 NOTE — Progress Notes (Signed)
Pt given order for Simethicone to help with possible gas pain.  ?

## 2021-12-05 ENCOUNTER — Encounter (HOSPITAL_COMMUNITY): Payer: Self-pay

## 2021-12-05 LAB — URINALYSIS, COMPLETE (UACMP) WITH MICROSCOPIC
Bilirubin Urine: NEGATIVE
Glucose, UA: NEGATIVE mg/dL
Hgb urine dipstick: NEGATIVE
Ketones, ur: NEGATIVE mg/dL
Leukocytes,Ua: NEGATIVE
Nitrite: NEGATIVE
Protein, ur: NEGATIVE mg/dL
RBC / HPF: NONE SEEN RBC/hpf (ref 0–5)
Specific Gravity, Urine: 1.01 (ref 1.005–1.030)
pH: 7 (ref 5.0–8.0)

## 2021-12-05 MED ORDER — LACTULOSE 10 GM/15ML PO SOLN
10.0000 g | Freq: Two times a day (BID) | ORAL | Status: DC
  Administered 2021-12-05: 10 g via ORAL
  Filled 2021-12-05 (×10): qty 15

## 2021-12-05 MED ORDER — DOCUSATE SODIUM 100 MG PO CAPS
100.0000 mg | ORAL_CAPSULE | Freq: Every day | ORAL | Status: DC
Start: 2021-12-05 — End: 2021-12-08
  Administered 2021-12-05: 100 mg via ORAL
  Filled 2021-12-05 (×7): qty 1

## 2021-12-05 NOTE — BH IP Treatment Plan (Signed)
Interdisciplinary Treatment and Diagnostic Plan Update ? ?12/05/2021 ?Time of Session: 9:40am  ?Felicia Sutton ?MRN: [id number] ? ?Principal Diagnosis: MDD (major depressive disorder), recurrent episode, severe (HCC) ? ?Secondary Diagnoses: Principal Problem: ?  MDD (major depressive disorder), recurrent episode, severe (HCC) ? ? ?Current Medications:  ?Current Facility-Administered Medications  ?Medication Dose Route Frequency Provider Last Rate Last Admin  ? acetaminophen (TYLENOL) tablet 650 mg  650 mg Oral Q6H PRN Chales Abrahams, NP      ? alum & mag hydroxide-simeth (MAALOX/MYLANTA) 200-200-20 MG/5ML suspension 30 mL  30 mL Oral Q4H PRN Ophelia Shoulder E, NP   30 mL at 12/01/21 1258  ? busPIRone (BUSPAR) tablet 10 mg  10 mg Oral BID Lauro Franklin, MD   10 mg at 12/05/21 5638  ? docusate sodium (COLACE) capsule 100 mg  100 mg Oral Daily Lauro Franklin, MD   100 mg at 12/05/21 7564  ? feeding supplement (ENSURE ENLIVE / ENSURE PLUS) liquid 237 mL  237 mL Oral BID BM Hill, Shelbie Hutching, MD   237 mL at 12/05/21 0958  ? FLUoxetine (PROZAC) capsule 30 mg  30 mg Oral Daily Massengill, Nathan, MD   30 mg at 12/05/21 3329  ? hydrOXYzine (ATARAX) tablet 25 mg  25 mg Oral TID PRN Chales Abrahams, NP   25 mg at 12/05/21 5188  ? magnesium hydroxide (MILK OF MAGNESIA) suspension 30 mL  30 mL Oral Daily PRN Ophelia Shoulder E, NP   30 mL at 12/04/21 1250  ? mirtazapine (REMERON) tablet 7.5 mg  7.5 mg Oral QHS Lauro Franklin, MD   7.5 mg at 12/04/21 2106  ? traZODone (DESYREL) tablet 50 mg  50 mg Oral QHS PRN Chales Abrahams, NP   50 mg at 12/04/21 2107  ? ?PTA Medications: ?No medications prior to admission.  ? ? ?Patient Stressors: Loss of son   ?Substance abuse   ?Other: homeless   ? ?Patient Strengths: Capable of independent living  ?Communication skills  ?Motivation for treatment/growth  ? ?Treatment Modalities: Medication Management, Group therapy, Case management,  ?1 to 1 session with clinician,  Psychoeducation, Recreational therapy. ? ? ?Physician Treatment Plan for Primary Diagnosis: MDD (major depressive disorder), recurrent episode, severe (HCC) ?Long Term Goal(s): Improvement in symptoms so as ready for discharge  ? ?Short Term Goals: Ability to identify changes in lifestyle to reduce recurrence of condition will improve ?Ability to verbalize feelings will improve ?Ability to disclose and discuss suicidal ideas ?Ability to demonstrate self-control will improve ?Ability to identify and develop effective coping behaviors will improve ? ?Medication Management: Evaluate patient's response, side effects, and tolerance of medication regimen. ? ?Therapeutic Interventions: 1 to 1 sessions, Unit Group sessions and Medication administration. ? ?Evaluation of Outcomes: Progressing ? ?Physician Treatment Plan for Secondary Diagnosis: Principal Problem: ?  MDD (major depressive disorder), recurrent episode, severe (HCC) ? ?Long Term Goal(s): Improvement in symptoms so as ready for discharge  ? ?Short Term Goals: Ability to identify changes in lifestyle to reduce recurrence of condition will improve ?Ability to verbalize feelings will improve ?Ability to disclose and discuss suicidal ideas ?Ability to demonstrate self-control will improve ?Ability to identify and develop effective coping behaviors will improve    ? ?Medication Management: Evaluate patient's response, side effects, and tolerance of medication regimen. ? ?Therapeutic Interventions: 1 to 1 sessions, Unit Group sessions and Medication administration. ? ?Evaluation of Outcomes: Progressing ? ? ?RN Treatment Plan for Primary Diagnosis: MDD (major depressive disorder), recurrent episode,  severe (HCC) ?Long Term Goal(s): Knowledge of disease and therapeutic regimen to maintain health will improve ? ?Short Term Goals: Ability to remain free from injury will improve, Ability to participate in decision making will improve, Ability to verbalize feelings will  improve, Ability to disclose and discuss suicidal ideas, and Ability to identify and develop effective coping behaviors will improve ? ?Medication Management: RN will administer medications as ordered by provider, will assess and evaluate patient's response and provide education to patient for prescribed medication. RN will report any adverse and/or side effects to prescribing provider. ? ?Therapeutic Interventions: 1 on 1 counseling sessions, Psychoeducation, Medication administration, Evaluate responses to treatment, Monitor vital signs and CBGs as ordered, Perform/monitor CIWA, COWS, AIMS and Fall Risk screenings as ordered, Perform wound care treatments as ordered. ? ?Evaluation of Outcomes: Progressing ? ? ?LCSW Treatment Plan for Primary Diagnosis: MDD (major depressive disorder), recurrent episode, severe (HCC) ?Long Term Goal(s): Safe transition to appropriate next level of care at discharge, Engage patient in therapeutic group addressing interpersonal concerns. ? ?Short Term Goals: Engage patient in aftercare planning with referrals and resources, Increase social support, Increase emotional regulation, Facilitate acceptance of mental health diagnosis and concerns, Identify triggers associated with mental health/substance abuse issues, and Increase skills for wellness and recovery ? ?Therapeutic Interventions: Assess for all discharge needs, 1 to 1 time with Child psychotherapist, Explore available resources and support systems, Assess for adequacy in community support network, Educate family and significant other(s) on suicide prevention, Complete Psychosocial Assessment, Interpersonal group therapy. ? ?Evaluation of Outcomes: Progressing ? ? ?Progress in Treatment: ?Attending groups: Yes. ?Participating in groups: Yes. ?Taking medication as prescribed: Yes. ?Toleration medication: Yes. ?Family/Significant other contact made: No, will contact:  Partner/Husband ?Patient understands diagnosis: Yes. ?Discussing  patient identified problems/goals with staff: Yes. ?Medical problems stabilized or resolved: Yes. ?Denies suicidal/homicidal ideation: Yes. ?Issues/concerns per patient self-inventory: No. ?  ?  ?New problem(s) identified: No, Describe:  none ?  ?New Short Term/Long Term Goal(s): detox, medication management for mood stabilization; elimination of SI thoughts; development of comprehensive mental wellness/sobriety plan ?  ?Patient Goals:  "To get better" ?  ?Discharge Plan or Barriers: Patient recently admitted. CSW will continue to follow and assess for appropriate referrals and possible discharge planning.  ?  ?  ?Reason for Continuation of Hospitalization: Depression ?Medication stabilization ?Suicidal ideation ?Withdrawal symptoms ?  ?Estimated Length of Stay: 3-5 days ? ? ?Scribe for Treatment Team: ?Aram Beecham, LCSWA ?12/05/2021 ?12:06 PM ?

## 2021-12-05 NOTE — Progress Notes (Signed)
?   12/05/21 1100  ?Psych Admission Type (Psych Patients Only)  ?Admission Status Voluntary  ?Psychosocial Assessment  ?Patient Complaints Anxiety;Depression  ?Eye Contact Brief  ?Facial Expression Anxious  ?Affect Anxious  ?Speech Logical/coherent  ?Interaction Assertive  ?Motor Activity Other (Comment) ?(wdl)  ?Appearance/Hygiene Unremarkable  ?Behavior Characteristics Cooperative;Anxious  ?Mood Depressed;Anxious  ?Thought Process  ?Coherency WDL  ?Content WDL  ?Delusions None reported or observed  ?Perception WDL  ?Hallucination None reported or observed  ?Judgment WDL  ?Confusion None  ?Danger to Self  ?Current suicidal ideation? Denies  ?Danger to Others  ?Danger to Others None reported or observed  ? ?Pt c/o of feeling constipated throughout the day.  Lactulose administered per MD order.  Pt reports having small BM after Lactulose administration. ?

## 2021-12-05 NOTE — Group Note (Signed)
Date:  12/05/2021 ?Time:  10:00 AM ? ?Group Topic/Focus:  ?Orientation:   The focus of this group is to educate the patient on the purpose and policies of crisis stabilization and provide a format to answer questions about their admission.  The group details unit policies and expectations of patients while admitted. ? ? ? ?Participation Level:  Active ? ?Participation Quality:  Appropriate ? ?Affect:  Appropriate ? ?Cognitive:  Appropriate ? ?Insight: Appropriate ? ?Engagement in Group:  Engaged ? ?Modes of Intervention:  Discussion ? ?Additional Comments:   ? ?Jaquita Rector ?12/05/2021, 10:00 AM ? ?

## 2021-12-05 NOTE — BHH Counselor (Addendum)
CSW spoke with the Pt who states that she did her intake with [facility] and the agency wants $500 dollars prior to admission.  She states that she also called [facility] and she states that they are only taking men at this time.  She states that she is willing to do outpatient substance use therapy and will begin calling shelters to look for housing options.  CSW also discussed with the Pt the options for changing her Medicaid to [facility] to allow the Pt to get admission into [facility] in the future.  The Pt states that she will look into this.  The Pt already has a copy of local shelter resources.  ? ?CSW also provided the Pt with the phone number to [facility] [place].  ?

## 2021-12-05 NOTE — Progress Notes (Signed)
[facility] MD Progress Note ? ?12/05/2021 11:33 AM ?[name]  ?MRN:  [id number] ?Subjective:   ?Felicia Sutton is a 41 yr old female who presented on 3/11 to [facility] for SI (plan to OD on Tylenol) with recent OD.  PPHx is significant for Depression, Substance Abuse (THC, Cocaine, and past EtOH/IV Heroine), and one prior Suicide Attempt via OD 2/23. ? ? ?Case was discussed in the multidisciplinary team. MAR was reviewed and patient was compliant with medications.  She received PRN Hydroxyzine, Milk of Mag, and Trazodone yesterday.  She received Simethicone for gas pain and Dulcolax yesterday for constipation. ? ? ?Psychiatric Team made the following recommendations yesterday: ?-Increase Prozac to 30 mg tomorrow for depression/anxiety ?-Start Remeron 7.5 mg QHS for depression and appetite ? ? ? ?On interview today patient reports she did not sleep well last night because of her abdominal pain/gas.  She reports her appetite has improved.  She reports no SI, HI, or AVH.  She reports no [place], Ideas of Reference, or other First Rank symptoms. ? ?She reports no issues with her medications.  She reports that her anxiety is still fairly significant still which could be because of her constipation/abdominal pain and poor sleep.  Discussed that her Prozac had been further increased this morning which should help her anxiety.  Discussed that I would start Colase daily and if she had not had a BM by this afternoon would increase it to twice a day.  She reports her last BM was yesterday morning.   ? ?Discussed with her that about her being unable to go to [facility] because of her insurance.  She reports understanding but still wants to go to residential if she can because she wants to remain abstinent.   Discussed that Social Work would come speak with her about different options.  She reports no other concerns at present.  ? ? ?Principal Problem: MDD (major depressive disorder), recurrent episode, severe ([place]) ?Diagnosis:  Principal Problem: ?  MDD (major depressive disorder), recurrent episode, severe ([place]) ? ?Total Time spent with patient:  ?I personally spent 30 minutes on the unit in direct patient care. The direct patient care time included face-to-face time with the patient, reviewing the patient's chart, communicating with other professionals, and coordinating care. Greater than 50% of this time was spent in counseling or coordinating care with the patient regarding goals of hospitalization, psycho-education, and discharge planning needs. ? ? ?Past Psychiatric History: Depression, Substance Abuse (THC, Cocaine, and past EtOH/IV Heroine), and one prior Suicide Attempt via OD 2/23. ? ?Past Medical History: History reviewed. No pertinent past medical history. History reviewed. No pertinent surgical history. ?Family History: History reviewed. No pertinent family history. ?Family Psychiatric  History: Reports no known Diagnosis', Substance Use, or Suicides ?Social History:  ?Social History  ? ?Substance and Sexual Activity  ?Alcohol Use Not Currently  ?   ?Social History  ? ?Substance and Sexual Activity  ?Drug Use Not Currently  ?  ?Social History  ? ?Socioeconomic History  ? Marital status: Single  ?  Spouse name: Not on file  ? Number of children: Not on file  ? Years of education: Not on file  ? Highest education level: Not on file  ?Occupational History  ? Not on file  ?Tobacco Use  ? Smoking status: Never  ? Smokeless tobacco: Never  ?Vaping Use  ? Vaping Use: Never used  ?Substance and Sexual Activity  ? Alcohol use: Not Currently  ? Drug use: Not Currently  ?  Sexual activity: Not on file  ?Other Topics Concern  ? Not on file  ?Social History Narrative  ? Not on file  ? ?Social Determinants of Health  ? ?Financial Resource Strain: Not on file  ?Food Insecurity: Not on file  ?Transportation Needs: Not on file  ?Physical Activity: Not on file  ?Stress: Not on file  ?Social Connections: Not on file  ? ?Additional Social History:   ?  ?  ?  ?  ?  ?  ?  ?  ?  ?  ?  ? ?Sleep: Poor due to abdominal pain ? ?Appetite:  Fair improving  ? ?Current Medications: ?Current Facility-Administered Medications  ?Medication Dose Route Frequency Provider Last Rate Last Admin  ? acetaminophen (TYLENOL) tablet 650 mg  650 mg Oral Q6H PRN Mallie Darting, NP      ? alum & mag hydroxide-simeth (MAALOX/MYLANTA) 200-200-20 MG/5ML suspension 30 mL  30 mL Oral Q4H PRN Merlyn Lot E, NP   30 mL at 12/01/21 1258  ? busPIRone (BUSPAR) tablet 10 mg  10 mg Oral BID Briant Cedar, MD   10 mg at 12/05/21 [id number]  ? docusate sodium (COLACE) capsule 100 mg  100 mg Oral Daily Briant Cedar, MD   100 mg at 12/05/21 [id number]  ? feeding supplement (ENSURE ENLIVE / ENSURE PLUS) liquid 237 mL  237 mL Oral BID BM Hill, Jackie Plum, MD   237 mL at 12/05/21 0958  ? FLUoxetine (PROZAC) capsule 30 mg  30 mg Oral Daily Massengill, Nathan, MD   30 mg at 12/05/21 [id number]  ? hydrOXYzine (ATARAX) tablet 25 mg  25 mg Oral TID PRN Mallie Darting, NP   25 mg at 12/05/21 [id number]  ? magnesium hydroxide (MILK OF MAGNESIA) suspension 30 mL  30 mL Oral Daily PRN Merlyn Lot E, NP   30 mL at 12/04/21 1250  ? mirtazapine (REMERON) tablet 7.5 mg  7.5 mg Oral QHS Briant Cedar, MD   7.5 mg at 12/04/21 2106  ? traZODone (DESYREL) tablet 50 mg  50 mg Oral QHS PRN Mallie Darting, NP   50 mg at 12/04/21 2107  ? ? ?Lab Results:  ?Results for orders placed or performed during the hospital encounter of 11/30/21 (from the past 48 hour(s))  ?Urinalysis, Complete w Microscopic Urine, Clean Catch     Status: Abnormal  ? Collection Time: 12/04/21  6:44 PM  ?Result Value Ref Range  ? Color, Urine YELLOW YELLOW  ? APPearance CLEAR CLEAR  ? Specific Gravity, Urine 1.010 1.005 - 1.030  ? pH 7.0 5.0 - 8.0  ? Glucose, UA NEGATIVE NEGATIVE mg/dL  ? Hgb urine dipstick NEGATIVE NEGATIVE  ? Bilirubin Urine NEGATIVE NEGATIVE  ? Ketones, ur NEGATIVE NEGATIVE mg/dL  ? Protein, ur NEGATIVE NEGATIVE mg/dL  ?  Nitrite NEGATIVE NEGATIVE  ? Leukocytes,Ua NEGATIVE NEGATIVE  ? Squamous Epithelial / LPF 0-5 0 - 5  ? WBC, UA 0-5 0 - 5 WBC/hpf  ? RBC / HPF NONE SEEN 0 - 5 RBC/hpf  ? Bacteria, UA MANY (A) NONE SEEN  ? Crystals AMORPHOUS URATES/PHOSPHATES (A) NEGATIVE  ?  Comment: Performed at [facility], [place] [address], [place], [place] [address]  ? ? ? ?Blood Alcohol level:  ?Lab Results  ?Component Value Date  ? ETH <10 11/29/2021  ? ? ?Metabolic Disorder Labs: ?Lab Results  ?Component Value Date  ? HGBA1C 5.4 12/02/2021  ? MPG 108 12/02/2021  ? ?No results found  for: PROLACTIN ?Lab Results  ?Component Value Date  ? CHOL 144 12/02/2021  ? TRIG 121 12/02/2021  ? HDL 36 (L) 12/02/2021  ? CHOLHDL 4.0 12/02/2021  ? VLDL 24 12/02/2021  ? [place] 84 12/02/2021  ? ? ?Physical Findings: ?AIMS: Facial and Oral Movements ?Muscles of Facial Expression: None, normal ?Lips and Perioral Area: None, normal ?Jaw: None, normal ?Tongue: None, normal,Extremity Movements ?Upper (arms, wrists, hands, fingers): None, normal ?Lower (legs, knees, ankles, toes): None, normal, Trunk Movements ?Neck, shoulders, hips: None, normal, Overall Severity ?Severity of abnormal movements (highest score from questions above): None, normal ?Incapacitation due to abnormal movements: None, normal ?Patient's awareness of abnormal movements (rate only patient's report): No Awareness, Dental Status ?Current problems with teeth and/or dentures?: No ?Does patient usually wear dentures?: No  ?CIWA:  CIWA-Ar Total: 1 ?COWS:    ? ?Musculoskeletal: ?Strength & Muscle Tone: within normal limits ?Gait & Station: normal ?Patient leans: N/A ? ?Psychiatric Specialty Exam: ? ?Presentation  ?General Appearance: Appropriate for Environment; Casual (missing several teeth) ? ?Eye Contact:Good ? ?Speech:Clear and Coherent; Normal Rate ? ?Speech Volume:Normal ? ?Handedness:Right ? ? ?Mood and Affect  ?Mood:Anxious ? ?Affect:Congruent ? ? ?Thought Process  ?Thought  Processes:Coherent; Goal Directed ? ?Descriptions of Associations:Intact ? ?Orientation:Full (Time, Place and Person) ? ?Thought Content:Logical ?No SI, HI, or AVH. No Paranoia, Ideas of Reference, or First Ra

## 2021-12-05 NOTE — Group Note (Signed)
LCSW Group Therapy Note ? ? ?Group Date: 12/05/2021 ?Start Time: 1300 ?End Time: 1400 ? ?Type of Therapy and Topic:  Group Therapy:  Healthy and Unhealthy Supports ? ?Participation Level:  Active  ? ?Description of Group:  Patients in this group were introduced to the idea of adding a variety of healthy supports to address the various needs in their lives, especially in reference to their plans and focus for the new year.  Patients discussed what additional healthy supports could be helpful in their recovery and wellness after discharge in order to prevent future hospitalizations.   An emphasis was placed on using counselor, doctor, therapy groups, 12-step groups, and problem-specific support groups to expand supports.   ? ?Therapeutic Goals: ? ? 1)  discuss importance of adding supports to stay well once out of the hospital ? 2)  compare healthy versus unhealthy supports and identify some examples of each ? 3)  generate ideas and descriptions of healthy supports that can be added ? 4)  offer mutual support about how to address unhealthy supports ? 5)  encourage active participation in and adherence to discharge plan ?  ? ?Summary of Patient Progress:  The patient attended group and remained there the entire time.  The Pt followed along with the discussion and was appropriate with their peers.  The Pt demonstrated understanding of the topic being discussed.  The Pt was able to find at least 1 support person or system in their life right now.  ? ?Carrick Rijos M Dolton Shaker, LCSWA ?12/05/2021  2:01 PM   ? ?

## 2021-12-05 NOTE — Group Note (Unsigned)
Date:  12/05/2021 ?Time:  9:48 AM ? ?Group Topic/Focus:  ?Orientation:   The focus of this group is to educate the patient on the purpose and policies of crisis stabilization and provide a format to answer questions about their admission.  The group details unit policies and expectations of patients while admitted. ? ? ? ? ?Participation Level:  {BHH PARTICIPATION LEVEL:22264} ? ?Participation Quality:  {BHH PARTICIPATION QUALITY:22265} ? ?Affect:  {BHH AFFECT:22266} ? ?Cognitive:  {BHH COGNITIVE:22267} ? ?Insight: {BHH Insight2:20797} ? ?Engagement in Group:  {BHH ENGAGEMENT IN GROUP:22268} ? ?Modes of Intervention:  {BHH MODES OF INTERVENTION:22269} ? ?Additional Comments:  *** ? ?Felicia Sutton ?12/05/2021, 9:48 AM ? ?

## 2021-12-05 NOTE — Group Note (Signed)
Recreation Therapy Group Note ? ? ?Group Topic:Stress Management  ?Group Date: 12/05/2021 ?Start Time: 82 ?End Time: 0950 ?Facilitators: Victorino Sparrow, LRT,CTRS ?Location: [place] ? ? ?Goal Area(s) Addresses:  ?Patient will identify positive stress management techniques. ?Patient will identify benefits of using stress management post d/c. ? ?Group Description:  Meditation.  LRT played a meditation that focused on having gratitude for what you have and what you have to offer to the world instead of focusing on the things you lack.  Patients were to listen and follow along as meditation played to engage in the activity. ? ? ?Affect/Mood: N/A ?  ?Participation Level: Did not attend ?  ? ?Clinical Observations/Individualized Feedback:   ? ? ?Plan: Continue to engage patient in RT group sessions 2-3x/week. ? ? ?Victorino Sparrow, LRT,CTRS ?12/05/2021 10:37 AM ?

## 2021-12-06 LAB — URINALYSIS, ROUTINE W REFLEX MICROSCOPIC
Bacteria, UA: NONE SEEN
Bilirubin Urine: NEGATIVE
Glucose, UA: NEGATIVE mg/dL
Hgb urine dipstick: NEGATIVE
Ketones, ur: NEGATIVE mg/dL
Nitrite: NEGATIVE
Protein, ur: NEGATIVE mg/dL
Specific Gravity, Urine: 1.004 — ABNORMAL LOW (ref 1.005–1.030)
pH: 8 (ref 5.0–8.0)

## 2021-12-06 MED ORDER — TRAZODONE HCL 100 MG PO TABS
100.0000 mg | ORAL_TABLET | Freq: Every day | ORAL | Status: DC
  Administered 2021-12-06 – 2021-12-07 (×2): 100 mg via ORAL
  Filled 2021-12-06 (×2): qty 1
  Filled 2021-12-06: qty 7
  Filled 2021-12-06 (×2): qty 1

## 2021-12-06 NOTE — Progress Notes (Signed)
?   12/05/21 2145  ?Psych Admission Type (Psych Patients Only)  ?Admission Status Voluntary  ?Psychosocial Assessment  ?Patient Complaints Anxiety  ?Eye Contact Brief  ?Facial Expression Anxious  ?Affect Anxious  ?Speech Logical/coherent  ?Interaction Assertive  ?Motor Activity Other (Comment) ?(WDL)  ?Appearance/Hygiene Unremarkable  ?Behavior Characteristics Cooperative;Anxious  ?Mood Anxious  ?Thought Process  ?Coherency WDL  ?Content WDL  ?Delusions None reported or observed  ?Perception WDL  ?Hallucination None reported or observed  ?Judgment WDL  ?Confusion None  ?Danger to Self  ?Current suicidal ideation? Denies  ?Danger to Others  ?Danger to Others None reported or observed  ? ? ?

## 2021-12-06 NOTE — Progress Notes (Addendum)
D: Patient states she did not sleep well last night, even though she took sleep medication. Her appetite is fair; her concentration today is poor. Patient rates her depression, hopelessness and anxiety as an 8. She continues to have passive SI without any intent or specific plan. Patient wrote on her self-inventory, "I don't want to be in this world." Her goal today is to work on "not feeling this way." Patient continues to feel her medications are not working well.  ? ?A: Continue to monitor medication management and MD orders.  Safety checks completed every 15 minutes per protocol.  Offer support and encouragement as needed. ? ?R: Patient is cooperative with staff; her behavior is appropriate.  ? ? 12/06/21 1100  ?Psych Admission Type (Psych Patients Only)  ?Admission Status Voluntary  ?Psychosocial Assessment  ?Patient Complaints Anxiety;Depression  ?Eye Contact Brief  ?Facial Expression Anxious  ?Affect Anxious  ?Speech Logical/coherent  ?Interaction Assertive  ?Motor Activity Other (Comment) ?(wnl)  ?Appearance/Hygiene Unremarkable  ?Behavior Characteristics Cooperative;Anxious  ?Mood Anxious;Depressed  ?Thought Process  ?Coherency WDL  ?Content WDL  ?Delusions None reported or observed  ?Perception WDL  ?Hallucination None reported or observed  ?Judgment WDL  ?Confusion None  ?Danger to Self  ?Current suicidal ideation? Denies  ?Danger to Others  ?Danger to Others None reported or observed  ? ? ?

## 2021-12-06 NOTE — Progress Notes (Addendum)
[facility] ? ?12/06/2021 3:05 PM ?Roney MansJennifer Holliman  ?MRN:  [id number] ? ?Subjective:  Victorino DikeJennifer reports: "I am still suicidal. I don't want to be in this world no more. I lost my son 2 years ago. The sleep medicine that I am taking here is not working." ? ?Today's assessment (12/06/2021): Pt's chart reviewed, her case discussed with the treatment team. Pt with flat affect and depressed mood, attention to personal hygiene and grooming is fair, eye contact is good, speech is clear & coherent. Thought contents are organized and logical, and pt currently reports suicidal ideation, denies having a plan, and verbally contracts for safety on the unit. She denies HI/AVH or paranoia. There is no evidence of delusional thoughts.  She ruminates about losing her only child to Covid 2 yrs ago, and states that he was in college in [place] at the time of his death. Empathy provided, pt encouraged to join a support group once discharged, and educated that social work will be made aware to find a therapist for her prior to discharge.  ? ?Pt continues to report insomnia, states that the medications being given to her for insomnia here are not helping. She reports that she took Remeron and Trazodone and slept for 2 hrs last night. As per flow sheets, she slept a total of 5.5 hrs last night. She rates her depression as 8 (10 being worst), and states her anxiety as 8 (10 being worst). She denies being in any physical pain/distress. Pt is currently on Prozac 30 mg for management of her depressive symptoms, and it was increased from 20 mg yesterday. She is also on Remeron 7.5 mg daily at bedtime for depression and insomnia. Will increase Trazodone to 100 mg nightly for depression and insomnia. Pt reports that she has had a bowel movement, and declined her Colace this morning. Urinalysis on 3/16 with "many bacteria". Will repeat UA. ? ? Labs on Admission: CMP: WNL except K: 3.3, repeated on 3/14 amd WNL at 141.   Ca: 8.8,  AST:  48,  ALT: 62, repeated on 3/14 and 51. Will need PCP follow up after discharge.  CBC: WNL, EtOH/Acetaminophen/Salicylate: WNL,  hCG: Neg,  UDS: THC positive,  Resp panel: Neg,  EKG: NSR with Qtc: 413. ?Labs (3/14): CMP: WNL except Albumin: 3.4 and ALT: 51,  Lipid Panel: WNL except HDL: 36,  TSH: 2.061, A1c: 5.4 ? ?HPI: Elpidio AnisJennifer Thompson is a 41 yr old female who [facility] (plan to OD on Tylenol) with recent OD.  PPHx is significant for Depression, Substance Abuse (THC, Cocaine, and past EtOH/IV Heroine), and one prior Suicide Attempt via OD 2/23. ? ?Principal Problem: MDD (major depressive disorder), recurrent episode, severe (HCC) ?Diagnosis: Principal Problem: ?  MDD (major depressive disorder), recurrent episode, severe (HCC) ? ?Past Psychiatric History: Depression, Substance Abuse (THC, Cocaine, and past EtOH/IV Heroine), and one prior Suicide Attempt via OD 2/23. ? ?Past Medical History: History reviewed. No pertinent past medical history. History reviewed. No pertinent surgical history. ?Family History: History reviewed. No pertinent family history. ?Family Psychiatric  History: Reports no known Diagnosis', Substance Use, or Suicides ?Social History:  ?Social History  ? ?Substance and Sexual Activity  ?Alcohol Use Not Currently  ?   ?Social History  ? ?Substance and Sexual Activity  ?Drug Use Not Currently  ?  ?Social History  ? ?Socioeconomic History  ? Marital status: Single  ?  Spouse name: Not on file  ? Number of  children: Not on file  ? Years of education: Not on file  ? Highest education level: Not on file  ?Occupational History  ? Not on file  ?Tobacco Use  ? Smoking status: Never  ? Smokeless tobacco: Never  ?Vaping Use  ? Vaping Use: Never used  ?Substance and Sexual Activity  ? Alcohol use: Not Currently  ? Drug use: Not Currently  ? Sexual activity: Not on file  ?Other Topics Concern  ? Not on file  ?Social History Narrative  ? Not on file  ? ?Social Determinants of Health   ? ?Financial Resource Strain: Not on file  ?Food Insecurity: Not on file  ?Transportation Needs: Not on file  ?Physical Activity: Not on file  ?Stress: Not on file  ?Social Connections: Not on file  ? ?Sleep: Poor  ? ?Appetite:  Fair improving  ? ?Current Medications: ?Current Facility-Administered Medications  ?Medication Dose Route Frequency Provider Last Rate Last Admin  ? acetaminophen (TYLENOL) tablet 650 mg  650 mg Oral Q6H PRN Chales Abrahams, NP      ? alum & mag hydroxide-simeth (MAALOX/MYLANTA) 200-200-20 MG/5ML suspension 30 mL  30 mL Oral Q4H PRN Ophelia Shoulder E, NP   30 mL at 12/01/21 1258  ? busPIRone (BUSPAR) tablet 10 mg  10 mg Oral BID Lauro Franklin, MD   10 mg at 12/06/21 3716  ? docusate sodium (COLACE) capsule 100 mg  100 mg Oral Daily Lauro Franklin, MD   100 mg at 12/05/21 9678  ? feeding supplement (ENSURE ENLIVE / ENSURE PLUS) liquid 237 mL  237 mL Oral BID BM Hill, Shelbie Hutching, MD   237 mL at 12/06/21 1445  ? FLUoxetine (PROZAC) capsule 30 mg  30 mg Oral Daily Massengill, Nathan, MD   30 mg at 12/06/21 9381  ? hydrOXYzine (ATARAX) tablet 25 mg  25 mg Oral TID PRN Ophelia Shoulder E, NP   25 mg at 12/06/21 1100  ? lactulose (CHRONULAC) 10 GM/15ML solution 10 g  10 g Oral BID Massengill, Harrold Donath, MD   10 g at 12/05/21 1507  ? magnesium hydroxide (MILK OF MAGNESIA) suspension 30 mL  30 mL Oral Daily PRN Ophelia Shoulder E, NP   30 mL at 12/04/21 1250  ? mirtazapine (REMERON) tablet 7.5 mg  7.5 mg Oral QHS Lauro Franklin, MD   7.5 mg at 12/05/21 2143  ? traZODone (DESYREL) tablet 100 mg  100 mg Oral QHS Starleen Blue, NP      ? ? ?Lab Results:  ?Results for orders placed or performed during the hospital encounter of 11/30/21 (from the past 48 hour(s))  ?Urinalysis, Complete w Microscopic Urine, Clean Catch     Status: Abnormal  ? Collection Time: 12/04/21  6:44 PM  ?Result Value Ref Range  ? Color, Urine YELLOW YELLOW  ? APPearance CLEAR CLEAR  ? Specific Gravity, Urine  1.010 1.005 - 1.030  ? pH 7.0 5.0 - 8.0  ? Glucose, UA NEGATIVE NEGATIVE mg/dL  ? Hgb urine dipstick NEGATIVE NEGATIVE  ? Bilirubin Urine NEGATIVE NEGATIVE  ? Ketones, ur NEGATIVE NEGATIVE mg/dL  ? Protein, ur NEGATIVE NEGATIVE mg/dL  ? Nitrite NEGATIVE NEGATIVE  ? Leukocytes,Ua NEGATIVE NEGATIVE  ? Squamous Epithelial / LPF 0-5 0 - 5  ? WBC, UA 0-5 0 - 5 WBC/hpf  ? RBC / HPF NONE SEEN 0 - 5 RBC/hpf  ? Bacteria, UA MANY (A) NONE SEEN  ? Crystals AMORPHOUS URATES/PHOSPHATES (A) NEGATIVE  ?  Comment: Performed  at [facility], [address], [place], [place] [address]  ? ? ?Blood Alcohol level:  ?Lab Results  ?Component Value Date  ? ETH <10 11/29/2021  ? ?Metabolic Disorder Labs: ?Lab Results  ?Component Value Date  ? HGBA1C 5.4 12/02/2021  ? MPG 108 12/02/2021  ? ?No results found for: PROLACTIN ?Lab Results  ?Component Value Date  ? CHOL 144 12/02/2021  ? TRIG 121 12/02/2021  ? HDL 36 (L) 12/02/2021  ? CHOLHDL 4.0 12/02/2021  ? VLDL 24 12/02/2021  ? LDLCALC 84 12/02/2021  ? ?Physical Findings: ?AIMS: Facial and Oral Movements ?Muscles of Facial Expression: None, normal ?Lips and Perioral Area: None, normal ?Jaw: None, normal ?Tongue: None, normal,Extremity Movements ?Upper (arms, wrists, hands, fingers): None, normal ?Lower (legs, knees, ankles, toes): None, normal, Trunk Movements ?Neck, shoulders, hips: None, normal, Overall Severity ?Severity of abnormal movements (highest score from questions above): None, normal ?Incapacitation due to abnormal movements: None, normal ?Patient's awareness of abnormal movements (rate only patient's report): No Awareness, Dental Status ?Current problems with teeth and/or dentures?: No ?Does patient usually wear dentures?: No  ?CIWA:  CIWA-Ar Total: 1 ?COWS:    ? ?Musculoskeletal: ?Strength & Muscle Tone: within normal limits ?Gait & Station: normal ?Patient leans: N/A ? ?Psychiatric Specialty Exam: ? ?Presentation  ?General Appearance: Appropriate for  Environment ? ?Eye Contact:Fair ? ?Speech:Clear and Coherent ? ?Speech Volume:Normal ? ?Handedness:Right ? ?Mood and Affect  ?Mood:Depressed; Anxious ? ?Affect:Flat ? ?Thought Process  ?Thought Processes:Coherent ?

## 2021-12-06 NOTE — Group Note (Signed)
LCSW Group Therapy Note ? ?12/06/2021   10:00-11:00am  ? ?Type of Therapy and Topic:  Group Therapy: Anger Cues and Responses ? ?Participation Level:  Active ? ? ?Description of Group:   ?In this group, patients learned how to recognize the physical, cognitive, emotional, and behavioral responses they have to anger-provoking situations.  They identified a recent time they became angry and how they reacted.  They analyzed how their reaction was possibly beneficial and how it was possibly unhelpful.  The group discussed a variety of healthier coping skills that could help with such a situation in the future.  Focus was placed on how helpful it is to recognize the underlying emotions to our anger, because working on those can lead to a more permanent solution as well as our ability to focus on the important rather than the urgent. ? ?Therapeutic Goals: ?Patients will remember their last incident of anger and how they felt emotionally and physically, what their thoughts were at the time, and how they behaved. ?Patients will identify how their behavior at that time worked for them, as well as how it worked against them. ?Patients will explore possible new behaviors to use in future anger situations. ?Patients will learn that anger itself is normal and cannot be eliminated, and that healthier reactions can assist with resolving conflict rather than worsening situations. ? ?Summary of Patient Progress:  The patient shared that her most recent time of anger was when she lost her son 2 years ago and said her anger has not let up since then.  She becomes suicidal, turns the anger on herself.  She did not talk further but was very attentive.  She did leave the room in anger when another patient started speaking religiously. ? ?Therapeutic Modalities:   ?Cognitive Behavioral Therapy ? ?Carloyn Jaeger Grossman-Orr   ?

## 2021-12-06 NOTE — Progress Notes (Signed)
?   12/06/21 2110  ?Psych Admission Type (Psych Patients Only)  ?Admission Status Voluntary  ?Psychosocial Assessment  ?Patient Complaints Anxiety  ?Eye Contact Brief  ?Facial Expression Anxious  ?Affect Anxious  ?Speech Logical/coherent  ?Interaction Assertive  ?Motor Activity Other (Comment) ?(WDL)  ?Appearance/Hygiene Unremarkable  ?Behavior Characteristics Cooperative;Anxious  ?Mood Anxious  ?Thought Process  ?Coherency WDL  ?Content WDL  ?Delusions None reported or observed  ?Perception WDL  ?Hallucination None reported or observed  ?Judgment WDL  ?Confusion None  ?Danger to Self  ?Current suicidal ideation? Denies  ?Danger to Others  ?Danger to Others None reported or observed  ? ? ?

## 2021-12-06 NOTE — Progress Notes (Signed)
Adult Psychoeducational Group Note ? ?Date:  12/06/2021 ?Time:  9:59 PM ? ?Group Topic/Focus:  ?Wrap-Up Group:   The focus of this group is to help patients review their daily goal of treatment and discuss progress on daily workbooks. ? ?Participation Level:  Minimal ? ?Participation Quality:  Appropriate ? ?Affect:  Appropriate ? ?Cognitive:  Appropriate ? ?Insight: Appropriate ? ?Engagement in Group:  Engaged ? ?Modes of Intervention:  Education and Exploration ? ?Additional Comments:  Patient attended and participated in group tonight. She reports that today she learn to let go f the pain and anger she has been holding on to ? ?Debe Coder ?12/06/2021, 9:59 PM ?

## 2021-12-07 DIAGNOSIS — F333 Major depressive disorder, recurrent, severe with psychotic symptoms: Secondary | ICD-10-CM

## 2021-12-07 NOTE — Progress Notes (Signed)
[facility] ? ?12/07/2021 11:50 AM ?Felicia MansJennifer Sutton  ?MRN:  [id number] ? ?Subjective:  Felicia DikeJennifer reports: "I feel great today. A friend here in [place] told me that I can come live with her." ? ?Today's assessment (12/07/2021): Pt's chart reviewed, her case discussed with the treatment team. Pt with a euthymic mood today, affect is appropriate and congruent. Attention to personal hygiene and grooming is fair, eye contact is good, speech is clear & coherent. Thought contents are organized and logical, and pt currently denies SI/HI/AVH, denies paranoia, and there is no evidence of delusional thoughts. She denies being in any physical discomfort or pain today. ? ?Pt reports that her sleep quality last night was good, and reports a good appetite. She reports feeling well rested this morning, and states that a friend who resides here in [place] asked him to come reside with her. She states that she has known the friend for 2 yrs. Pt educated that his assigned social worker will have to complete safety planning with the friend prior to discharge. She reports no longer being interested in going to rehab. Pt is reporting that she is tolerating her medications well, and denies having any concerns. Pt is showing improvement on current medications, and will continue them as listed below with no changes at this time. ? ? Labs on Admission: CMP: WNL except K: 3.3, repeated on 3/14 amd WNL at 141.   Ca: 8.8,  AST: 48,  ALT: 62, repeated on 3/14 and 51. Will need PCP follow up after discharge.  CBC: WNL, EtOH/Acetaminophen/Salicylate: WNL,  hCG: Neg,  UDS: THC positive,  Resp panel: Neg,  EKG: NSR with Qtc: 413. ?Labs (3/14): CMP: WNL except Albumin: 3.4 and ALT: 51,  Lipid Panel: WNL except HDL: 36,  TSH: 2.061, A1c: 5.4 ? ?HPI: Felicia Sutton is a 41 yr old female who [facility] (plan to OD on Tylenol) with recent OD.  PPHx is significant for Depression, Substance Abuse (THC, Cocaine, and  past EtOH/IV Heroine), and one prior Suicide Attempt via OD 2/23. ? ?Principal Problem: MDD (major depressive disorder), recurrent episode, severe (HCC) ?Diagnosis: Principal Problem: ?  MDD (major depressive disorder), recurrent episode, severe (HCC) ? ?Past Psychiatric History: Depression, Substance Abuse (THC, Cocaine, and past EtOH/IV Heroine), and one prior Suicide Attempt via OD 2/23. ? ?Past Medical History: History reviewed. No pertinent past medical history. History reviewed. No pertinent surgical history. ?Family History: History reviewed. No pertinent family history. ?Family Psychiatric  History: Reports no known Diagnosis', Substance Use, or Suicides ?Social History:  ?Social History  ? ?Substance and Sexual Activity  ?Alcohol Use Not Currently  ?   ?Social History  ? ?Substance and Sexual Activity  ?Drug Use Not Currently  ?  ?Social History  ? ?Socioeconomic History  ? Marital status: Single  ?  Spouse name: Not on file  ? Number of children: Not on file  ? Years of education: Not on file  ? Highest education level: Not on file  ?Occupational History  ? Not on file  ?Tobacco Use  ? Smoking status: Never  ? Smokeless tobacco: Never  ?Vaping Use  ? Vaping Use: Never used  ?Substance and Sexual Activity  ? Alcohol use: Not Currently  ? Drug use: Not Currently  ? Sexual activity: Not on file  ?Other Topics Concern  ? Not on file  ?Social History Narrative  ? Not on file  ? ?Social Determinants of Health  ? ?Financial  Resource Strain: Not on file  ?Food Insecurity: Not on file  ?Transportation Needs: Not on file  ?Physical Activity: Not on file  ?Stress: Not on file  ?Social Connections: Not on file  ? ?Sleep: Poor  ? ?Appetite:  Fair improving  ? ?Current Medications: ?Current Facility-Administered Medications  ?Medication Dose Route Frequency Provider Last Rate Last Admin  ? acetaminophen (TYLENOL) tablet 650 mg  650 mg Oral Q6H PRN Chales Abrahams, NP      ? alum & mag hydroxide-simeth (MAALOX/MYLANTA)  200-200-20 MG/5ML suspension 30 mL  30 mL Oral Q4H PRN Ophelia Shoulder E, NP   30 mL at 12/01/21 1258  ? busPIRone (BUSPAR) tablet 10 mg  10 mg Oral BID Lauro Franklin, MD   10 mg at 12/07/21 1287  ? docusate sodium (COLACE) capsule 100 mg  100 mg Oral Daily Lauro Franklin, MD   100 mg at 12/05/21 8676  ? feeding supplement (ENSURE ENLIVE / ENSURE PLUS) liquid 237 mL  237 mL Oral BID BM Hill, Shelbie Hutching, MD   237 mL at 12/07/21 0939  ? FLUoxetine (PROZAC) capsule 30 mg  30 mg Oral Daily Massengill, Nathan, MD   30 mg at 12/07/21 7209  ? hydrOXYzine (ATARAX) tablet 25 mg  25 mg Oral TID PRN Ophelia Shoulder E, NP   25 mg at 12/06/21 1100  ? lactulose (CHRONULAC) 10 GM/15ML solution 10 g  10 g Oral BID Massengill, Harrold Donath, MD   10 g at 12/05/21 1507  ? magnesium hydroxide (MILK OF MAGNESIA) suspension 30 mL  30 mL Oral Daily PRN Ophelia Shoulder E, NP   30 mL at 12/04/21 1250  ? mirtazapine (REMERON) tablet 7.5 mg  7.5 mg Oral QHS Lauro Franklin, MD   7.5 mg at 12/06/21 2110  ? traZODone (DESYREL) tablet 100 mg  100 mg Oral QHS Starleen Blue, NP   100 mg at 12/06/21 2110  ? ? ?Lab Results:  ?Results for orders placed or performed during the hospital encounter of 11/30/21 (from the past 48 hour(s))  ?Urinalysis, Routine w reflex microscopic Urine, Clean Catch     Status: Abnormal  ? Collection Time: 12/06/21  7:57 PM  ?Result Value Ref Range  ? Color, Urine COLORLESS (A) YELLOW  ? APPearance CLEAR CLEAR  ? Specific Gravity, Urine 1.004 (L) 1.005 - 1.030  ? pH 8.0 5.0 - 8.0  ? Glucose, UA NEGATIVE NEGATIVE mg/dL  ? Hgb urine dipstick NEGATIVE NEGATIVE  ? Bilirubin Urine NEGATIVE NEGATIVE  ? Ketones, ur NEGATIVE NEGATIVE mg/dL  ? Protein, ur NEGATIVE NEGATIVE mg/dL  ? Nitrite NEGATIVE NEGATIVE  ? Leukocytes,Ua TRACE (A) NEGATIVE  ? RBC / HPF 0-5 0 - 5 RBC/hpf  ? WBC, UA 6-10 0 - 5 WBC/hpf  ? Bacteria, UA NONE SEEN NONE SEEN  ? Squamous Epithelial / LPF 0-5 0 - 5  ? Mucus PRESENT   ? Non Squamous  Epithelial 0-5 (A) NONE SEEN  ?  Comment: Performed at [facility], [address], [place], [place] [address]  ? ?Blood Alcohol level:  ?Lab Results  ?Component Value Date  ? ETH <10 11/29/2021  ? ?Metabolic Disorder Labs: ?Lab Results  ?Component Value Date  ? HGBA1C 5.4 12/02/2021  ? MPG 108 12/02/2021  ? ?No results found for: PROLACTIN ?Lab Results  ?Component Value Date  ? CHOL 144 12/02/2021  ? TRIG 121 12/02/2021  ? HDL 36 (L) 12/02/2021  ? CHOLHDL 4.0 12/02/2021  ? VLDL 24  12/02/2021  ? LDLCALC 84 12/02/2021  ? ?Physical Findings: ?AIMS: Facial and Oral Movements ?Muscles of Facial Expression: None, normal ?Lips and Perioral Area: None, normal ?Jaw: None, normal ?Tongue: None, normal,Extremity Movements ?Upper (arms, wrists, hands, fingers): None, normal ?Lower (legs, knees, ankles, toes): None, normal, Trunk Movements ?Neck, shoulders, hips: None, normal, Overall Severity ?Severity of abnormal movements (highest score from questions above): None, normal ?Incapacitation due to abnormal movements: None, normal ?Patient's awareness of abnormal movements (rate only patient's report): No Awareness, Dental Status ?Current problems with teeth and/or dentures?: No ?Does patient usually wear dentures?: No  ?CIWA:  CIWA-Ar Total: 1 ?COWS:  n/a  ? ?Musculoskeletal: ?Strength & Muscle Tone: within normal limits ?Gait & Station: normal ?Patient leans: N/A ? ?Psychiatric Specialty Exam: ? ?Presentation  ?General Appearance: Appropriate for Environment ? ?Eye Contact:Fair ? ?Speech:Clear and Coherent ? ?Speech Volume:Normal ? ?Handedness:Right ? ?Mood and Affect  ?Mood:Euthymic ? ?Affect:Appropriate ? ?Thought Process  ?Thought Processes:Coherent ? ?Descriptions of Associations:Intact ? ?Orientation:Full (Time, Place and Person) ? ?Thought Content:Logical ?No SI, HI, or AVH. No Paranoia, Ideas of Reference, or First Rank symptoms. ? ?History of Schizophrenia/Schizoaffective disorder:No ? ?Duration of  Psychotic Symptoms:No data recorded ?Hallucinations:Hallucinations: None ? ?Ideas of Reference:None ? ?Suicidal Thoughts:Suicidal Thoughts: No ?SI Passive Intent and/or Plan: Without Plan; Without Intent ? ?Ho

## 2021-12-07 NOTE — Progress Notes (Signed)
?   12/07/21 2320  ?Psych Admission Type (Psych Patients Only)  ?Admission Status Voluntary  ?Psychosocial Assessment  ?Patient Complaints Anxiety  ?Eye Contact Brief  ?Facial Expression Flat  ?Affect Appropriate to circumstance  ?Speech Logical/coherent  ?Interaction Assertive  ?Motor Activity Other (Comment) ?(WDL)  ?Appearance/Hygiene Unremarkable  ?Behavior Characteristics Appropriate to situation  ?Mood Anxious  ?Thought Process  ?Coherency WDL  ?Content WDL  ?Delusions None reported or observed  ?Perception WDL  ?Hallucination None reported or observed  ?Judgment WDL  ?Confusion None  ?Danger to Self  ?Current suicidal ideation? Denies  ?Self-Injurious Behavior No self-injurious ideation or behavior indicators observed or expressed   ?Agreement Not to Harm Self Yes  ?Description of Agreement verbal  ?Danger to Others  ?Danger to Others None reported or observed  ? ? ?

## 2021-12-07 NOTE — Progress Notes (Signed)
?   12/07/21 1000  ?Psych Admission Type (Psych Patients Only)  ?Admission Status Voluntary  ?Psychosocial Assessment  ?Patient Complaints Anxiety  ?Eye Contact Brief  ?Facial Expression Anxious  ?Affect Anxious  ?Speech Logical/coherent  ?Interaction Assertive  ?Motor Activity Other (Comment) ?(wnl)  ?Appearance/Hygiene Unremarkable  ?Behavior Characteristics Cooperative;Anxious  ?Mood Anxious  ?Thought Process  ?Coherency WDL  ?Content WDL  ?Delusions None reported or observed  ?Perception WDL  ?Hallucination None reported or observed  ?Judgment WDL  ?Confusion None  ?Danger to Self  ?Current suicidal ideation? Denies  ?Danger to Others  ?Danger to Others None reported or observed  ? ? ?

## 2021-12-07 NOTE — Group Note (Signed)
Date:  12/07/2021 ?Time:  11:41 AM ? ?Group Topic/Focus:  ?Goals Group:   The focus of this group is to help patients establish daily goals to achieve during treatment and discuss how the patient can incorporate goal setting into their daily lives to aide in recovery. ? ? ? ?Participation Level:  Active ? ?Participation Quality:  Appropriate ? ?Affect:  Appropriate ? ?Cognitive:  Appropriate ? ?Insight: Appropriate ? ?Engagement in Group:  Engaged ? ?Modes of Intervention:  Discussion ? ?Additional Comments:  Patient has a goal of being happy today.Discussed what happiness ment to her. Explained  some of the benefits of being happy: promotes better health, increases positive moods and enhances productivity.  ? ?Felicia Sutton ?12/07/2021, 11:41 AM ? ?

## 2021-12-07 NOTE — Group Note (Signed)
[place] LCSW Group Therapy Note ? ?Date/Time:  12/07/2021  10:00AM-11:00AM ? ?Type of Therapy and Topic:  Group Therapy:  Music's Effect on Depression and Anxiety ? ?Participation Level:  Active  ? ?Description of Group: ?In this process group, members discussed what types of music trigger them to worsening mental health symptoms and what they can do about this in the future.  For instance, we discussed what to do when riding in a friend's car and a song harmful to the patient starts playing.  We also discussed how music can be used as a tool to help with wellness and recovery in various ways including managing depression and anxiety as well as encouraging healthy sleep habits and avoiding relapse into old negative behaviors such as drinking, self-harming, or staying in bed all day.  A variety of songs were played as examples.  Discussion was elicited which showed the group to be in agreement that the songs were quite relatable and have the potential to help them outside of the hospital setting. ? ?Therapeutic Goals: ?Patients will be introduced to a variety of songs that can relate to self-image and self-love in a helpful way. ?Patients will explore the impact of different pieces of music on their feelings, i.e. uplifting, triggering, etc. ?Patients will discuss how to use this self-knowledge to assist in recovery. ? ?Summary of Patient Progress:  At the beginning of group, patient expressed that sad music is triggering for her while happy music is soothing.  At the end of group patient reported that she felt better. ? ?Therapeutic Modalities: ?Solution Focused Brief Therapy ?Activity ? ? ?Selmer Dominion, LCSW ? ?  ?

## 2021-12-08 ENCOUNTER — Encounter (HOSPITAL_COMMUNITY): Payer: Self-pay

## 2021-12-08 DIAGNOSIS — F1590 Other stimulant use, unspecified, uncomplicated: Secondary | ICD-10-CM

## 2021-12-08 DIAGNOSIS — F411 Generalized anxiety disorder: Secondary | ICD-10-CM

## 2021-12-08 MED ORDER — BUSPIRONE HCL 10 MG PO TABS: 10.0000 mg | ORAL_TABLET | Freq: Two times a day (BID) | ORAL | 0 refills | Status: DC

## 2021-12-08 MED ORDER — FLUOXETINE HCL 10 MG PO CAPS: 30.0000 mg | ORAL_CAPSULE | Freq: Every day | ORAL | 0 refills | Status: DC

## 2021-12-08 MED ORDER — TRAZODONE HCL 100 MG PO TABS: 100.0000 mg | ORAL_TABLET | Freq: Every day | ORAL | 0 refills | Status: DC

## 2021-12-08 MED ORDER — MIRTAZAPINE 7.5 MG PO TABS: 7.5000 mg | ORAL_TABLET | Freq: Every day | ORAL | 0 refills | Status: DC

## 2021-12-08 MED ORDER — HYDROXYZINE HCL 25 MG PO TABS: 25.0000 mg | ORAL_TABLET | Freq: Three times a day (TID) | ORAL | 0 refills | Status: DC | PRN

## 2021-12-08 MED ORDER — FLUOXETINE HCL 10 MG PO CAPS
30.0000 mg | ORAL_CAPSULE | Freq: Every day | ORAL | Status: DC
  Filled 2021-12-08 (×2): qty 21

## 2021-12-08 NOTE — Group Note (Signed)
LCSW Group Therapy Note ? ? ?Group Date: 12/08/2021 ?Start Time: 1300 ?End Time: 1400 ? ? ?Type of Therapy and Topic:  Group Therapy:  Positive Affirmations ?  ?Participation Level:  Active ? ?Description of Group: ?This group addressed positive affirmation toward self and others. Patients went around the room and identified two positive things about themselves and two positive things about a peer in the room. Patients reflected on how it felt to share something positive with others, to identify positive things about themselves, and to hear positive things from others. Patients were encouraged to have a daily reflection of positive characteristics or circumstances. ?Therapeutic Goals ?Patient will verbalize two of their positive qualities ?Patient will demonstrate empathy for others by stating two positive qualities about a peer in the group ?Patient will verbalize their feelings when voicing positive self affirmations and when voicing positive affirmations of others ?Patients will discuss the potential positive impact on their wellness/recovery of focusing on positive traits of self and others. ? ? ?Summary of Patient Progress:  Due to the spread of Illness on the 300 and 400 halls group was not held in the dayroom.  CSW met with each patient individually as needed with PPE precautions in place to reduce the spread of any further Illness.  CSW provided the patient's with worksheet packets and instructions on how to complete those packets.  CSW will follow up with patient's as needed.  ? ? ?Therapeutic Modalities ?Cognitive Behavioral Therapy ?Motivational Interviewing ? ?Aliece Honold M Trini Christiansen, LCSWA ?12/08/2021  1:54 PM   ? ?

## 2021-12-08 NOTE — Progress Notes (Signed)
Discharge Note:   Patient discharged to a friend's home via a friend.  Suicide prevention information given and discussed, and patient stated she understood.  Denied SI and HI.  Denied A/V hallucinations.  Patient stated she received all her belongings, clothing, toiletries, etc.  Patient stated she appreciated all assistance received from [facility] staff.  All required discharge information given. ? ?

## 2021-12-08 NOTE — Discharge Summary (Signed)
Physician Discharge Summary Note ? ?Patient:  Felicia Sutton is an 41 y.o., female ?MRN:  [id number] ?DOB:  1980-10-28 ?Patient phone:  [phone])  ?Patient address:   ?Homeless [place] [id number],  ?Total Time spent with patient: 20 minutes ? ?Date of Admission:  11/30/2021 ?Date of Discharge: 12/08/2021 ? ?Reason for Admission:   SI (plan to OD on Tylenol) with recent OD ? ?Principal Problem: MDD (major depressive disorder), recurrent episode, severe (HCC) ?Discharge Diagnoses: Principal Problem: ?  MDD (major depressive disorder), recurrent episode, severe (HCC) ? ? ?Past Psychiatric History: Depression, Substance Abuse (THC, Cocaine, and past EtOH/IV Heroine), and one prior Suicide Attempt via OD 2/23. ? ?Past Medical History: History reviewed. No pertinent past medical history. History reviewed. No pertinent surgical history. ?Family History: History reviewed. No pertinent family history. ?Family Psychiatric  History: Reports no known Diagnosis', Substance Use, or Suicides ?Social History:  ?Social History  ? ?Substance and Sexual Activity  ?Alcohol Use Not Currently  ?   ?Social History  ? ?Substance and Sexual Activity  ?Drug Use Not Currently  ?  ?Social History  ? ?Socioeconomic History  ? Marital status: Single  ?  Spouse name: Not on file  ? Number of children: Not on file  ? Years of education: Not on file  ? Highest education level: Not on file  ?Occupational History  ? Not on file  ?Tobacco Use  ? Smoking status: Never  ? Smokeless tobacco: Never  ?Vaping Use  ? Vaping Use: Never used  ?Substance and Sexual Activity  ? Alcohol use: Not Currently  ? Drug use: Not Currently  ? Sexual activity: Not on file  ?Other Topics Concern  ? Not on file  ?Social History Narrative  ? Not on file  ? ?Social Determinants of Health  ? ?Financial Resource Strain: Not on file  ?Food Insecurity: Not on file  ?Transportation Needs: Not on file  ?Physical Activity: Not on file  ?Stress: Not on file  ?Social  Connections: Not on file  ? ? ?Hospital Course:  Elpidio AnisJennifer Thompson is a 41 yr old female who [facility] SI (plan to OD on Tylenol) with recent OD.  PPHx is significant for Depression, Substance Abuse (THC, Cocaine, and past EtOH/IV Heroine), and one prior Suicide Attempt via OD 2/23. ? ?HOSPITAL COURSE: ? ?During the patient's hospitalization, patient had extensive initial psychiatric evaluation, and follow-up psychiatric evaluations every day. ? ?Psychiatric diagnoses provided upon initial assessment: MDD  (major depressive disorder), recurrent episode, severe ? ?Patient's psychiatric medications were adjusted on admission:  ? -Increase Prozac to 20 mg  ?-Start Remeron 15 mg QHS, due to issues with sleep and appetite ?-Continue Ativan 1 mg TID PRN (patient did not require at any point during hospitalization) ? ?During the hospitalization, other adjustments were made to the patient's psychiatric medication regimen:  ? ?- Buspar was started at 10mg  BID, for anxiety ?- Remeron was continued ?- Prozac was titrated up to 30mg  ?- Trazodone was started at 50mg  PRN and made 100mg  scheduled ?  ?Patient's care was discussed during the interdisciplinary team meeting every day during the hospitalization. ? ?The patient denied having side effects to prescribed psychiatric medication. ? ?Gradually, patient started adjusting to milieu. The patient was evaluated each day by a clinical provider to ascertain response to treatment. Improvement was noted by the patient's report of decreasing symptoms, improved sleep and appetite, affect, medication tolerance, behavior, and participation in unit programming.  Patient was asked each  day to complete a self inventory noting mood, mental status, pain, new symptoms, anxiety and concerns.   ?Symptoms were reported as significantly decreased or resolved completely by discharge.  ?The patient reports that their mood is stable.  ?The patient denied having suicidal thoughts  for more than 48 hours prior to discharge.  Patient denies having homicidal thoughts.  Patient denies having auditory hallucinations.  Patient denies any visual hallucinations or other symptoms of psychosis.  ?The patient was motivated to continue taking medication with a goal of continued improvement in mental health.  ? ?The patient reports their target psychiatric symptoms of depressed nmood responded well to the psychiatric medications, and the patient reports overall benefit other psychiatric hospitalization. Supportive psychotherapy was provided to the patient. The patient also participated in regular group therapy while hospitalized. Coping skills, problem solving as well as relaxation therapies were also part of the unit programming. ? ?Labs were reviewed with the patient, and abnormal results were discussed with the patient. ? ?The patient is able to verbalize their individual safety plan to this provider. ? ?# It is recommended to the patient to continue psychiatric medications as prescribed, after discharge from the hospital.   ? ?# It is recommended to the patient to follow up with your outpatient psychiatric provider and PCP. ? ?# It was discussed with the patient, the impact of alcohol, drugs, tobacco have been there overall psychiatric and medical wellbeing, and total abstinence from substance use was recommended the patient.ed. ? ?# Prescriptions provided or sent directly to preferred pharmacy at discharge. Patient agreeable to plan. Given opportunity to ask questions. Appears to feel comfortable with discharge.  ?  ?# In the event of worsening symptoms, the patient is instructed to call the crisis hotline, 911, 988, or go to [facility],  and or go to the nearest ED for appropriate evaluation and treatment of symptoms. To follow-up with primary care provider for other medical issues, concerns and or health care needs ? ?# Patient was discharged friend's home, after safety planning was done by SW with a  plan to follow up as noted below.  ? ?Physical Findings: ?AIMS: Facial and Oral Movements ?Muscles of Facial Expression: None, normal ?Lips and Perioral Area: None, normal ?Jaw: None, normal ?Tongue: None, normal,Extremity Movements ?Upper (arms, wrists, hands, fingers): None, normal ?Lower (legs, knees, ankles, toes): None, normal, Trunk Movements ?Neck, shoulders, hips: None, normal, Overall Severity ?Severity of abnormal movements (highest score from questions above): None, normal ?Incapacitation due to abnormal movements: None, normal ?Patient's awareness of abnormal movements (rate only patient's report): No Awareness, Dental Status ?Current problems with teeth and/or dentures?: No ?Does patient usually wear dentures?: No  ?CIWA:  CIWA-Ar Total: 1 ?COWS:    ? ?Musculoskeletal: ?Strength & Muscle Tone: within normal limits ?Gait & Station: normal ?Patient leans: N/A ? ? ?Psychiatric Specialty Exam: ? ?Presentation  ?General Appearance: Appropriate for Environment ? ?Eye Contact:Fair ? ?Speech:Clear and Coherent ? ?Speech Volume:Normal ? ?Handedness:Right ? ? ?Mood and Affect  ?Mood:Euthymic ? ?Affect:Appropriate; Congruent ? ? ?Thought Process  ?Thought Processes:Goal Directed ? ?Descriptions of Associations:Circumstantial ? ?Orientation:Full (Time, Place and Person) ? ?Thought Content:Logical ? ?History of Schizophrenia/Schizoaffective disorder:No ? ?Duration of Psychotic Symptoms:No data recorded ?Hallucinations:Hallucinations: None ? ?Ideas of Reference:None ? ?Suicidal Thoughts:Suicidal Thoughts: No ? ?Homicidal Thoughts:Homicidal Thoughts: No ? ? ?Sensorium  ?Memory:Immediate Good ? ?Judgment:-- (Improving) ? ?Insight:Shallow ? ? ?Executive Functions  ?Concentration:Fair ? ?Attention Span:Fair ? ?Recall:Fair ? ?Fund of Knowledge:Poor ? ?Language:Fair ? ? ?Psychomotor Activity  ?  Psychomotor Activity:Psychomotor Activity: Normal ? ? ?Assets  ?Assets:Resilience ? ? ?Sleep  ?Sleep:Sleep: Good ? ? ? ?Physical  Exam: ?Physical Exam ?HENT:  ?   Head: Normocephalic and atraumatic.  ?Pulmonary:  ?   Effort: Pulmonary effort is normal.  ?Neurological:  ?   Mental Status: She is alert and oriented to person, place, and time.  ?

## 2021-12-08 NOTE — Plan of Care (Signed)
Nurse discussed anxiety, depression and coping skills with patient.  

## 2021-12-08 NOTE — [facility] Suicide Risk Assessment (Signed)
[facility] INPATIENT:  Family/Significant Other Suicide Prevention Education ? ?Suicide Prevention Education:  ?Education Completed; Erlene Quan [phone] (Friend) has been identified by the patient as the family member/significant other with whom the patient will be residing, and identified as the person(s) who will aid the patient in the event of a mental health crisis (suicidal ideations/suicide attempt).  With written consent from the patient, the family member/significant other has been provided the following suicide prevention education, prior to the and/or following the discharge of the patient. ? ?The suicide prevention education provided includes the following: ?Suicide risk factors ?Suicide prevention and interventions ?National Suicide Hotline telephone number ?[facility] assessment telephone number ?[facility] Emergency Assistance 911 ?[place] and/or Residential Mobile Crisis Unit telephone number ? ?Request made of family/significant other to: ?Remove weapons (e.g., guns, rifles, knives), all items previously/currently identified as safety concern.   ?Remove drugs/medications (over-the-counter, prescriptions, illicit drugs), all items previously/currently identified as a safety concern. ? ?The family member/significant other verbalizes understanding of the suicide prevention education information provided.  The family member/significant other agrees to remove the items of safety concern listed above. ? ?CSW spoke with Erlene Quan who states that he works from 9:00am to 6:00pm daily.  He states that he will remind her to go to appointments regularly and will be a support for her.  He states that there are no firearms or weapons in the home.  He would like her medications to be sent to the [facility] the [facility] so they would be easier for him and her to pick up.  He states that he has no other concerns or questions at this time.  CSW completed SPE with Erlene Quan.  ? ?Darleen Crocker ?12/08/2021, 9:33 AM ?

## 2021-12-08 NOTE — Progress Notes (Signed)
D:  Patient denied SI and HI, contracts for safety.  Denied A/V hallucinations.  Denied pain.   ?A:  Medications administered per MD orders.  Emotional support and encouragement given patient ?R:  Denied SI and HI, contracts for safety.  Denied A/V hallucinations.  Safety maintained with 15 minute checks. ? ?Patient stated she slept 7 hours last night. ? ?

## 2021-12-08 NOTE — [facility] Counselor (Signed)
CSW spoke with the Pt who states that she will be staying with a friend named Apolinar Junes who lives in [place].  She states that she attempted to contact [facility] and was informed that the only openings that had were in [place].  She states that she did not want to travel that far at this time.  The Pt states that she would like to stay with her friend and attend outpatient substance use treatment.  CSW confirmed this plan with the Friend named Brandon through SPE contact.  ?

## 2021-12-08 NOTE — Group Note (Signed)
Recreation Therapy Group Note ? ? ?Group Topic:Stress Management  ?Group Date: 12/08/2021 ?Start Time: 0930 ?End Time: 0955 ?Facilitators: Caroll Rancher, LRT,CTRS ?Location: 300 Hall Dayroom ? ? ?Goal Area(s) Addresses:  ?Patient will identify positive stress management techniques. ?Patient will identify benefits of using stress management post d/c. ? ?Group Description:  Meditation.  LRT played a meditation that focused on finding your rhythm through breathing to help calm stress.  Patients were to let whatever rhythm of breathing that was comfortable to them, calm and relax them.  Patients were to listen along as meditation played to fully engage in activity. ? ? ?Affect/Mood: Appropriate ?  ?Participation Level: Active ?  ?Participation Quality: Independent ?  ?Behavior: Attentive  ?  ?Speech/Thought Process: Focused ?  ?Insight: Good ?  ?Judgement: Good ?  ?Modes of Intervention: Meditation ?  ?Patient Response to Interventions:  Engaged ?  ?Education Outcome: ? Acknowledges education and In group clarification offered   ? ?Clinical Observations/Individualized Feedback: Pt attended and participated in group session.  ?  ? ?Plan: Continue to engage patient in RT group sessions 2-3x/week. ? ? ?Caroll Rancher, LRT,CTRS ?12/08/2021 11:54 AM ?

## 2021-12-08 NOTE — Progress Notes (Signed)
Adult Psychoeducational Group Note ? ?Date:  12/08/2021 ?Time:  1:53 AM ? ?Group Topic/Focus:  ?Wrap-Up Group:   The focus of this group is to help patients review their daily goal of treatment and discuss progress on daily workbooks. ? ?Participation Level:  Active ? ?Participation Quality:  Appropriate ? ?Affect:  Appropriate ? ?Cognitive:  Appropriate ? ?Insight: Appropriate ? ?Engagement in Group:  Engaged ? ?Modes of Intervention:  Discussion ? ?Additional Comments:  Pt stated her goal for today was to focus on her treatment plan. Pt stated she accomplished her goal today. Pt stated she talked with her doctor and with her social worker about her care today. Pt rated her overall day a 8 out of 10. Pt stated she made no calls today.  Pt stated she felt better about herself tonight. Pt stated she was able to attend all meals today. Pt stated she took all medications provided today. Pt stated her appetite was pretty good today. Pt rated her sleep last night was good. Pt stated the goal tonight was to get some rest. Pt stated she had no physical pain tonight. Pt deny visual hallucinations and auditory issues tonight. Pt denies thoughts of harming herself or others. Pt stated she would alert staff if anything changed. ? ?Felipa Furnace ?12/08/2021, 1:53 AM ?

## 2021-12-08 NOTE — [facility] Suicide Risk Assessment (Addendum)
Suicide Risk Assessment ? ?Discharge Assessment    ?[facility] Discharge Suicide Risk Assessment ? ? ?Principal Problem: MDD (major depressive disorder), recurrent episode, severe ([place]) ?Discharge Diagnoses: Principal Problem: ?  MDD (major depressive disorder), recurrent episode, severe ([place]) ? ? ?Total Time spent with patient: 20 minutes ? ?During the patient's hospitalization, patient had extensive initial psychiatric evaluation, and follow-up psychiatric evaluations every day. ? ?Psychiatric diagnoses provided upon initial assessment:  ?MDD  (major depressive disorder), recurrent episode, severe ? ?Patient's psychiatric medications were adjusted on admission: ? -Increase Prozac to 20 mg  ?-Start Remeron 15 mg QHS, due to issues with sleep and appetite ?-Continue Ativan 1 mg TID PRN ? ?During the hospitalization, other adjustments were made to the patient's psychiatric medication regimen:  ?- Buspar was started at 10mg  BID, for anxiety ?- Remeron was continued ?- Prozac was titrated up to 30mg  ?- Trazodone was started at 50mg  PRN and made 100mg  scheduled ? ?Gradually, patient started adjusting to milieu.   ?Patient's care was discussed during the interdisciplinary team meeting every day during the hospitalization. ? ?The patient denied having side effects to prescribed psychiatric medication. ? ?The patient reports their target psychiatric symptoms of SI, depressed mood, and anxiety responded well to the psychiatric medications, and the patient reports overall benefit other psychiatric hospitalization. Supportive psychotherapy was provided to the patient. The patient also participated in regular group therapy while admitted.  ? ?Labs were reviewed with the patient, and abnormal results were discussed with the patient. ? ?The patient denied having suicidal thoughts more than 48 hours prior to discharge.  Patient denies having homicidal thoughts.  Patient denies having auditory hallucinations.  Patient denies any visual  hallucinations.  Patient denies having paranoid thoughts. ? ?The patient is able to verbalize their individual safety plan to this provider. ? ?It is recommended to the patient to continue psychiatric medications as prescribed, after discharge from the hospital.   ? ?It is recommended to the patient to follow up with your outpatient psychiatric provider and PCP. ? ?Discussed with the patient, the impact of alcohol, drugs, tobacco have been there overall psychiatric and medical wellbeing, and total abstinence from substance use was recommended the patient.  ?Musculoskeletal: ?Strength & Muscle Tone: within normal limits ?Gait & Station: normal ?Patient leans: N/A ? ?Psychiatric Specialty Exam ? ?Presentation  ?General Appearance: Appropriate for Environment ? ?Eye Contact:Fair ? ?Speech:Clear and Coherent ? ?Speech Volume:Normal ? ?Handedness:Right ? ? ?Mood and Affect  ?Mood:Euthymic ? ?Duration of Depression Symptoms: Greater than two weeks ? ?Affect:Appropriate; Congruent ? ? ?Thought Process  ?Thought Processes:Goal Directed ? ?Descriptions of Associations:Circumstantial ? ?Orientation:Full (Time, Place and Person) ? ?Thought Content:Logical ? ?History of Schizophrenia/Schizoaffective disorder:No ? ?Duration of Psychotic Symptoms:No data recorded ?Hallucinations:Hallucinations: None ? ?Ideas of Reference:None ? ?Suicidal Thoughts:Suicidal Thoughts: No ? ?Homicidal Thoughts:Homicidal Thoughts: No ? ? ?Sensorium  ?Memory:Immediate Good ? ?Judgment:-- (Improving) ? ?Insight:Shallow ? ? ?Executive Functions  ?Concentration:Fair ? ?Attention Span:Fair ? ?Recall:Fair ? ?[employer] [place] ? ?Language:Fair ? ? ?Psychomotor Activity  ?Psychomotor Activity:Psychomotor Activity: Normal ? ? ?Assets  ?Assets:Resilience ? ? ?Sleep  ?Sleep:Sleep: Good ? ? ?Physical Exam: ?Physical Exam ?HENT:  ?   Head: Normocephalic and atraumatic.  ?Pulmonary:  ?   Effort: Pulmonary effort is normal.  ?Neurological:  ?   Mental Status:  She is alert and oriented to person, place, and time.  ? ?Review of Systems  ?Psychiatric/Behavioral:  Negative for depression, hallucinations and suicidal ideas. The patient does not have insomnia.   ?  Blood pressure 112/69, pulse (!) 109, temperature 97.7 ?F (36.5 ?C), resp. rate 18, height 5\' 2"  (1.575 m), weight 89.8 kg, last menstrual period 11/16/2021, SpO2 100 %. Body mass index is 36.21 kg/m?. ? ?Mental Status Per Nursing Assessment::   ?On Admission:  Suicidal ideation indicated by patient ? ?Demographic Factors:  ?Caucasian and Low socioeconomic status ? ?Loss Factors: ?Loss of significant relationship ? ?Historical Factors: ?Prior suicide attempts ? ?Risk Reduction Factors:   ?Living with another person, especially a relative ? ?Continued Clinical Symptoms:  ?Denies ? ?Cognitive Features That Contribute To Risk:  ?None   ? ?Suicide Risk:  ?Mild:   There are no identifiable suicide plans, no associated intent, mild dysphoria and related symptoms, good self-control (both objective and subjective assessment), few other risk factors, and identifiable protective factors, including available and accessible social support. ? ? ? Follow-up Information   ? ? [place]. Go to.   ?Specialty: Behavioral Health ?Why: You may go to this provider for therapy and medication management services during walk in hours:  Monday through Wednesday, from 7:30 am to 10:30 am.  Services are provided on a first come, first served basis, so please arrive early. ?Contact information: ?[address] ?[place] [address] ?[phone] ? ?  ?  ? ? Services, [facility] Follow up.   ?Why: You may go to this provider for therapy and medication management services, or call to schedule an appointment. ?Contact information: ?[address] ?[place] [address] ?[phone] ? ? ?  ?  ? ? [place] [facility] Follow up.   ?Specialty: Addiction Medicine ?Why:  Referral made ?Contact information: ?[id number] ?[place] [address] ?[phone] ? ? ?  ?  ? ? Services, [facility] Follow up.   ?Why: Referral made ?Contact information: ?[address] ?[place] [address] ?[phone] ? ? ?  ?  ? ? [facility] Follow up.   ?Contact information: ?[place] [address] ?[phone] ? ? ?  ?  ? ? [place]. Go on 12/10/2021.   ?Specialty: Behavioral Health ?Why: You have an appointment for this program on 12/10/21 at 1:00 pm.  This appointment will be held in person. ?Contact information: ?[id number] ?[place] ?[phone] ? ?  ?  ? ?  ?  ? ?  ? ? ?Plan Of Care/Follow-up recommendations:  ?Patient is instructed to take all prescribed medications as recommended. Report any side effects or adverse reactions to your outpatient psychiatrist. Patient is instructed to abstain from alcohol and illegal drugs while on prescription medications. In the event of worsening symptoms, patient is instructed to call the crisis hotline, 911, 988, or go to [facility], or go to the nearest emergency department for evaluation and treatment.   ? ? ?PGY-2 ?Freida Busman, MD ?12/08/2021, 10:30 AM ? ?Total Time Spent in Direct Patient Care:  ?I personally spent 45 minutes on the unit in direct patient care. The direct patient care time included face-to-face time with the patient, reviewing the patient's chart, communicating with other professionals, and coordinating care. Greater than 50% of this time was spent in counseling or coordinating care with the patient regarding goals of hospitalization, psycho-education, and discharge planning needs. ? ?On my assessment the patient denied SI, HI, AVH, paranoia, ideas of reference, or first rank symptoms on day of discharge. Patient denied drug  cravings or active signs of withdrawal. Patient denied medication  side-effects. Patient was not deemed to be a danger to self or others on day of discharge and was in agreement with discharge plans.  ? ?I have independently evaluated the patient during a face-to-face assessment on t

## 2021-12-08 NOTE — Progress Notes (Signed)
?  [facility] Adult Case Management Discharge Plan : ? ?Will you be returning to the same living situation after discharge:  No. [facility]  ?At discharge, do you have transportation home?: Yes,  Friend or Taxi  ?Do you have the ability to pay for your medications: Yes,  Medicare ? ?Release of information consent forms completed and in the chart;  Patient's signature needed at discharge. ? ?Patient to Follow up at: ? Follow-up Information   ? ? [facility]. Go to.   ?Specialty: Behavioral Health ?Why: You may go to this provider for therapy and medication management services during walk in hours:  Monday through Wednesday, from 7:30 am to 10:30 am.  Services are provided on a first come, first served basis, so please arrive early. ?Contact information: ?[address] ?[place] [address] ?[phone] ? ?  ?  ? ? Services, [facility] Follow up.   ?Why: You may go to this provider for therapy and medication management services, or call to schedule an appointment. ?Contact information: ?[address] ?[address] ?[place] [address] ?[phone] ? ? ?  ?  ? ? [facility] Follow up.   ?Specialty: Addiction Medicine ?Why: Referral made ?Contact information: ?[address] ?[place] [address] ?[phone] ? ? ?  ?  ? ? Services, [facility] Follow up.   ?Why: Referral made ?Contact information: ?[address] ?[place] [address] ?[phone] ? ? ?  ?  ? ? [facility] Follow up.   ?Contact information: ?[address] ?[place] [address] ?[phone] ? ? ?  ?  ? ? [facility]AL [facility]. Go on 12/10/2021.   ?Specialty: Behavioral Health ?Why: You have an appointment for this program on 12/10/21 at 1:00 pm.  This appointment will be held in person. ?Contact information: ?[address] ?[id number] mc ?[place] [address] ?[phone] ? ?  ?  ? ?  ?  ? ?   ? ? ?Next level of care provider has access to [facility] Link:yes ? ?Safety Planning and Suicide Prevention discussed: Yes,  with patient and friend  ? ?  ? ?Has patient been referred to the Quitline?: N/A patient is not a smoker ? ?Patient has been referred for addiction treatment: Yes ? ?Aram Beecham, LCSWA ?12/08/2021, 9:49 AM ?

## 2021-12-10 ENCOUNTER — Other Ambulatory Visit: Payer: Self-pay

## 2021-12-10 ENCOUNTER — Telehealth (HOSPITAL_COMMUNITY): Payer: Self-pay | Admitting: Licensed Clinical Social Worker

## 2021-12-10 ENCOUNTER — Ambulatory Visit (HOSPITAL_COMMUNITY): Payer: Medicare Other | Admitting: Licensed Clinical Social Worker

## 2021-12-10 NOTE — Telephone Encounter (Signed)
The therapist attempts to reach Felicia Sutton by phone due to her no show today leaving a HIPAA-compliant voicemail with his call back number which is [phone].  ? ?Myrna Blazer, MA, LCSW, [facility], LCAS ?

## 2021-12-30 ENCOUNTER — Emergency Department (HOSPITAL_COMMUNITY)
Admission: EM | Admit: 2021-12-30 | Discharge: 2021-12-30 | Disposition: A | Discharge status: Home / Self Care | Payer: Medicare Other | Attending: Emergency Medicine | Admitting: Emergency Medicine

## 2021-12-30 ENCOUNTER — Emergency Department (HOSPITAL_COMMUNITY): Payer: Medicare Other

## 2021-12-30 DIAGNOSIS — D72829 Elevated white blood cell count, unspecified: Secondary | ICD-10-CM | POA: Diagnosis not present

## 2021-12-30 DIAGNOSIS — Z79899 Other long term (current) drug therapy: Secondary | ICD-10-CM | POA: Diagnosis not present

## 2021-12-30 DIAGNOSIS — R1031 Right lower quadrant pain: Secondary | ICD-10-CM | POA: Insufficient documentation

## 2021-12-30 DIAGNOSIS — N200 Calculus of kidney: Secondary | ICD-10-CM | POA: Insufficient documentation

## 2021-12-30 DIAGNOSIS — R110 Nausea: Secondary | ICD-10-CM | POA: Diagnosis not present

## 2021-12-30 LAB — URINALYSIS, ROUTINE W REFLEX MICROSCOPIC
Bilirubin Urine: NEGATIVE
Glucose, UA: NEGATIVE mg/dL
Hgb urine dipstick: NEGATIVE
Ketones, ur: NEGATIVE mg/dL
Nitrite: NEGATIVE
Protein, ur: NEGATIVE mg/dL
Specific Gravity, Urine: 1.021 (ref 1.005–1.030)
pH: 5 (ref 5.0–8.0)

## 2021-12-30 LAB — COMPREHENSIVE METABOLIC PANEL
ALT: 73 U/L — ABNORMAL HIGH (ref 0–44)
AST: 79 U/L — ABNORMAL HIGH (ref 15–41)
Albumin: 3.4 g/dL — ABNORMAL LOW (ref 3.5–5.0)
Alkaline Phosphatase: 74 U/L (ref 38–126)
Anion gap: 7 (ref 5–15)
BUN: 9 mg/dL (ref 6–20)
CO2: 26 mmol/L (ref 22–32)
Calcium: 8.8 mg/dL — ABNORMAL LOW (ref 8.9–10.3)
Chloride: 106 mmol/L (ref 98–111)
Creatinine, Ser: 0.82 mg/dL (ref 0.44–1.00)
GFR, Estimated: >60 mL/min (ref >60)
Glucose, Bld: 107 mg/dL — ABNORMAL HIGH (ref 70–99)
Potassium: 3.9 mmol/L (ref 3.5–5.1)
Sodium: 139 mmol/L (ref 135–145)
Total Bilirubin: 0.4 mg/dL (ref 0.3–1.2)
Total Protein: 6.6 g/dL (ref 6.5–8.1)

## 2021-12-30 LAB — CBC
HCT: 36.4 % (ref 36.0–46.0)
Hemoglobin: 11.7 g/dL — ABNORMAL LOW (ref 12.0–15.0)
MCH: 28.5 pg (ref 26.0–34.0)
MCHC: 32.1 g/dL (ref 30.0–36.0)
MCV: 88.6 fL (ref 80.0–100.0)
Platelets: 264 10*3/uL (ref 150–400)
RBC: 4.11 MIL/uL (ref 3.87–5.11)
RDW: 12.8 % (ref 11.5–15.5)
WBC: 9.3 10*3/uL (ref 4.0–10.5)
nRBC: 0 % (ref 0.0–0.2)

## 2021-12-30 LAB — POC URINE PREG, ED: Preg Test, Ur: NEGATIVE

## 2021-12-30 LAB — PREGNANCY, URINE: Preg Test, Ur: NEGATIVE

## 2021-12-30 LAB — LIPASE, BLOOD: Lipase: 44 U/L (ref 11–51)

## 2021-12-30 LAB — I-STAT BETA HCG BLOOD, ED (MC, WL, AP ONLY): I-stat hCG, quantitative: 9.7 m[IU]/mL — ABNORMAL HIGH (ref <5)

## 2021-12-30 MED ORDER — FENTANYL CITRATE PF 50 MCG/ML IJ SOSY
50.0000 ug | PREFILLED_SYRINGE | Freq: Once | INTRAMUSCULAR | Status: AC
  Administered 2021-12-30: 50 ug via INTRAVENOUS
  Filled 2021-12-30: qty 1

## 2021-12-30 MED ORDER — OXYCODONE-ACETAMINOPHEN 5-325 MG PO TABS
1.0000 | ORAL_TABLET | Freq: Once | ORAL | Status: AC
  Administered 2021-12-30: 1 via ORAL
  Filled 2021-12-30: qty 1

## 2021-12-30 MED ORDER — IOHEXOL 350 MG/ML SOLN
75.0000 mL | Freq: Once | INTRAVENOUS | Status: AC | PRN
  Administered 2021-12-30: 75 mL via INTRAVENOUS

## 2021-12-30 NOTE — ED Provider Notes (Signed)
?[facility] EMERGENCY DEPARTMENT ?Provider Note ? ? ?CSN: [id number] ?Arrival date & time: 12/30/21  1519 ? ?  ? ?History ? ?Chief Complaint  ?Patient presents with  ? Abdominal Pain  ? ? ?Felicia Sutton is a 41 y.o. female. ? ?Patient is a 41 year old female presenting with right lower quadrant pain that has been present for few days.  Patient reports that pain is characterized as sharp in nature and is associated with nausea.  She reports that she has been able to tolerate food orally and last had a meal 2 hours prior to my exam.  Patient denies any vaginal pruritus or discharge or abnormal bleeding.  She reports that her last menses  Was 1 month ago.  Patient states that she is sexually active and does not use any form of contraception at this time. ?Patient states that she has tried Tylenol, ibuprofen and heating pads to help with her abdominal pain with no improvement.  She reports that the pain is constant.  Pain does not change with changes in position.  Patient denies any hematochezia or melena.  She reports normal bowel movements and denies any presence of constipation.  She denies any dysuria or difficulty urinating.  Patient denies any fevers or chills recently.  She denies any sick contacts in the home or anyone with similar symptoms. ? ? ?  ? ?Home Medications ?Prior to Admission medications   ?Medication Sig Start Date End Date Taking? Authorizing Provider  ?busPIRone (BUSPAR) 10 MG tablet Take 1 tablet (10 mg total) by mouth 2 (two) times daily. 12/08/21   Massengill, Harrold Donath, MD  ?FLUoxetine (PROZAC) 10 MG capsule Take 3 capsules (30 mg total) by mouth daily. 12/09/21   Massengill, Harrold Donath, MD  ?hydrOXYzine (ATARAX) 25 MG tablet Take 1 tablet (25 mg total) by mouth 3 (three) times daily as needed for anxiety. 12/08/21   Massengill, Harrold Donath, MD  ?metoCLOPramide (REGLAN) 5 MG tablet Take 1-2 tablets (5-10 mg total) by mouth 3 (three) times daily with meals. Use for 5-7 days during flares  02/02/20 03/03/20  Elige Radon, MD  ?mirtazapine (REMERON) 7.5 MG tablet Take 1 tablet (7.5 mg total) by mouth at bedtime. 12/08/21   Massengill, Harrold Donath, MD  ?pantoprazole (PROTONIX) 40 MG tablet Take 1 tablet (40 mg total) by mouth daily. 02/02/20   Elige Radon, MD  ?promethazine (PHENERGAN) 25 MG tablet Take 1 tablet (25 mg total) by mouth every 6 (six) hours as needed for nausea or vomiting. 01/31/20   Petrucelli, Lelon Mast R, PA-C  ?traZODone (DESYREL) 100 MG tablet Take 1 tablet (100 mg total) by mouth at bedtime. 12/08/21   Massengill, Harrold Donath, MD  ?famotidine (PEPCID) 20 MG tablet Take 1 tablet (20 mg total) by mouth 2 (two) times daily. ?Patient not taking: Reported on 01/10/2020 01/02/20 01/31/20  Tilden Fossa, MD  ?   ? ?Allergies    ?Patient has no known allergies.   ? ?Review of Systems   ?Review of Systems  ?Constitutional:  Negative for activity change, appetite change, chills and fever.  ?HENT:  Negative for congestion.   ?Cardiovascular:  Negative for chest pain.  ?Gastrointestinal:  Positive for abdominal pain and nausea. Negative for blood in stool, constipation and diarrhea.  ?Genitourinary:  Negative for difficulty urinating, dysuria, frequency, hematuria, vaginal bleeding, vaginal discharge and vaginal pain.  ?Skin:  Negative for wound.  ? ?Physical Exam ?Updated Vital Signs ?BP 103/75   Pulse 67   Temp 97.8 ?F (36.6 ?C) (Oral)   Resp  17   SpO2 97%  ?Physical Exam ?Constitutional:   ?   Appearance: She is well-developed. She is ill-appearing. She is not toxic-appearing.  ?HENT:  ?   Mouth/Throat:  ?   Pharynx: No oropharyngeal exudate.  ?   Comments: Dry  ?Cardiovascular:  ?   Rate and Rhythm: Normal rate and regular rhythm.  ?Pulmonary:  ?   Effort: Pulmonary effort is normal.  ?   Breath sounds: No wheezing or rales.  ?Abdominal:  ?   General: Bowel sounds are decreased. There is no distension.  ?   Palpations: Abdomen is soft. There is no hepatomegaly, splenomegaly or mass.  ?    Tenderness: There is abdominal tenderness in the right lower quadrant. There is no rebound. Negative signs include Rovsing's sign.  ?Neurological:  ?   Mental Status: She is alert.  ? ? ?ED Results / Procedures / Treatments   ?Labs ?(all labs ordered are listed, but only abnormal results are displayed) ?Labs Reviewed  ?COMPREHENSIVE METABOLIC PANEL - Abnormal; Notable for the following components:  ?    Result Value  ? Glucose, Bld 107 (*)   ? Calcium 8.8 (*)   ? Albumin 3.4 (*)   ? AST 79 (*)   ? ALT 73 (*)   ? All other components within normal limits  ?CBC - Abnormal; Notable for the following components:  ? Hemoglobin 11.7 (*)   ? All other components within normal limits  ?URINALYSIS, ROUTINE W REFLEX MICROSCOPIC - Abnormal; Notable for the following components:  ? APPearance HAZY (*)   ? Leukocytes,Ua SMALL (*)   ? Bacteria, UA FEW (*)   ? All other components within normal limits  ?I-STAT BETA HCG BLOOD, ED ([facility], [facility], [facility] ONLY) - Abnormal; Notable for the following components:  ? I-stat hCG, quantitative 9.7 (*)   ? All other components within normal limits  ?LIPASE, BLOOD  ?PREGNANCY, URINE  ?POC URINE PREG, ED  ? ? ?EKG ?None ? ?Radiology ?CT Abdomen Pelvis W Contrast ? ?Result Date: 12/30/2021 ?CLINICAL DATA:  Right lower quadrant abdominal pain for 4 days. No nausea vomiting or diarrhea. EXAM: CT ABDOMEN AND PELVIS WITH CONTRAST TECHNIQUE: Multidetector CT imaging of the abdomen and pelvis was performed using the standard protocol following bolus administration of intravenous contrast. RADIATION DOSE REDUCTION: This exam was performed according to the departmental dose-optimization program which includes automated exposure control, adjustment of the mA and/or kV according to patient size and/or use of iterative reconstruction technique. CONTRAST:  61mL OMNIPAQUE IOHEXOL 350 MG/ML SOLN COMPARISON:  CT abdomen and pelvis without contrast 08/27/2018 FINDINGS: Lower chest: Lung bases are unremarkable. Heart size  is at the upper limits of normal. No pericardial effusion. Hepatobiliary: Smooth liver contours. No focal liver mass is identified. The gallbladder is not visualized and may be contracted or surgically absent. No cholecystectomy clips are visualized. No intrahepatic or extrahepatic biliary ductal dilatation. Pancreas: No mass or inflammatory fat stranding. No pancreatic ductal dilatation is seen. Spleen: Normal in size without focal abnormality. Adrenals/Urinary Tract: Adrenal glands are unremarkable. The kidneys enhance uniformly and are symmetric in size without hydronephrosis. There is a 5 mm nonobstructing right renal midpole stone. There are 6 mm left upper pole 3 mm left mid to upper pole, 5 mm left midpole, and 1 mm left lower pole (axial image 39) nonobstructing left renal stones. No ureteral stone is seen. No renal mass is seen. The urinary bladder is underdistended but otherwise grossly unremarkable. Stomach/Bowel: Mild sigmoid  diverticulosis without inflammatory changes to indicate acute diverticulitis. A diverticula is seen at the descending colon/transverse colon splenic junction. The terminal ileum is unremarkable. Normal caliber of the appendix extending superiorly from the posterolateral aspect of the cecum (axial images 62 through 66 coronal images 58 through 71. No dilated loops of bowel to indicate bowel obstruction. Vascular/Lymphatic: No abdominal aortic aneurysm. No mesenteric, retroperitoneal, or pelvic lymphadenopathy. Reproductive: The uterus is present and unremarkable. No adnexal mass is seen. Other: No abdominal wall hernia or abnormality.No abdominopelvic ascites. No pneumoperitoneum. Musculoskeletal: No acute or significant osseous findings. IMPRESSION:: IMPRESSION: 1. Normal caliber of the appendix without inflammatory changes to indicate acute appendicitis. 2. Nonobstructing bilateral renal stones measuring up to 5 mm on the right and 6 mm on the left. 3. Mild sigmoid diverticulosis  without inflammatory changes to indicate acute diverticulitis. Electronically Signed   By: Neita Garnetonald  Viola M.D.   On: 12/30/2021 20:37   ? ?Procedures ?Procedures  ? ? ?Medications Ordered in ED ?Medications  ?oxyCODO

## 2021-12-30 NOTE — ED Triage Notes (Signed)
Pt c/o RLQ abd pain x4 days, gradually worsening. No urinary symptom, no bowel symptoms. Intermittent nausea associated ? ?Pt advises hx tubes tied ? ? ?

## 2021-12-30 NOTE — Discharge Instructions (Addendum)
You were seen for abdominal pain.  ? ?The CT completed today showed kidney stones that are small on both sides; because you do not have symptoms related to these and the small size, you will likely pass these on your own. The imaging did not show signs of appendicitis.  ? ?Please return to care if you experience worsening abdominal pain and are unable to tolerate drinking fluids by mouth due to vomiting.  ?

## 2021-12-30 NOTE — ED Provider Triage Note (Signed)
Emergency Medicine Provider Triage Evaluation Note ? ?Felicia Sutton , a 41 y.o. female  was evaluated in triage.  Pt complains of 4 days of right lower quadrant abdominal pain.  Previous surgeries involve a bilateral tubal ligation.  She denies any urinary symptoms or abnormal discharge.  She denies any vomiting, diarrhea, or constipation.  She has been nauseated.  She still has her appendix. ? ? ?Physical Exam  ?BP 125/83 (BP Location: Left Arm)   Pulse 95   Temp 98.6 ?F (37 ?C) (Oral)   Resp 16   SpO2 98%  ?Gen:   Awake, no distress   ?Resp:  Normal effort  ?MSK:   Moves extremities without difficulty  ?Other:  Abdomen is tender to palpation, right lower quadrant. ? ?Medical Decision Making  ?Medically screening exam initiated at 5:05 PM.  Appropriate orders placed.  Felicia Sutton was informed that the remainder of the evaluation will be completed by another provider, this initial triage assessment does not replace that evaluation, and the importance of remaining in the ED until their evaluation is complete. ? ?CT scan to evaluate for appendicitis. ?  ?Cristina Gong, PA-C ?12/30/21 1706 ? ?

## 2022-01-01 ENCOUNTER — Emergency Department (HOSPITAL_COMMUNITY)
Admission: EM | Admit: 2022-01-01 | Discharge: 2022-01-02 | Discharge status: Against Medical Advice | Payer: Medicare Other | Attending: Emergency Medicine | Admitting: Emergency Medicine

## 2022-01-01 ENCOUNTER — Encounter (HOSPITAL_COMMUNITY): Payer: Self-pay

## 2022-01-01 ENCOUNTER — Emergency Department (HOSPITAL_COMMUNITY): Payer: Medicare Other

## 2022-01-01 ENCOUNTER — Other Ambulatory Visit: Payer: Self-pay

## 2022-01-01 ENCOUNTER — Ambulatory Visit: Payer: Self-pay | Admitting: *Deleted

## 2022-01-01 DIAGNOSIS — R1031 Right lower quadrant pain: Secondary | ICD-10-CM | POA: Insufficient documentation

## 2022-01-01 DIAGNOSIS — R3915 Urgency of urination: Secondary | ICD-10-CM | POA: Insufficient documentation

## 2022-01-01 DIAGNOSIS — R350 Frequency of micturition: Secondary | ICD-10-CM | POA: Diagnosis not present

## 2022-01-01 DIAGNOSIS — Z5321 Procedure and treatment not carried out due to patient leaving prior to being seen by health care provider: Secondary | ICD-10-CM | POA: Diagnosis not present

## 2022-01-01 DIAGNOSIS — N201 Calculus of ureter: Secondary | ICD-10-CM | POA: Diagnosis not present

## 2022-01-01 DIAGNOSIS — R300 Dysuria: Secondary | ICD-10-CM | POA: Diagnosis not present

## 2022-01-01 LAB — URINALYSIS, ROUTINE W REFLEX MICROSCOPIC
Bilirubin Urine: NEGATIVE
Glucose, UA: NEGATIVE mg/dL
Hgb urine dipstick: NEGATIVE
Ketones, ur: NEGATIVE mg/dL
Leukocytes,Ua: NEGATIVE
Nitrite: NEGATIVE
Protein, ur: NEGATIVE mg/dL
Specific Gravity, Urine: 1.014 (ref 1.005–1.030)
pH: 8 (ref 5.0–8.0)

## 2022-01-01 LAB — COMPREHENSIVE METABOLIC PANEL
ALT: 39 U/L (ref 0–44)
AST: 21 U/L (ref 15–41)
Albumin: 3.5 g/dL (ref 3.5–5.0)
Alkaline Phosphatase: 68 U/L (ref 38–126)
Anion gap: 10 (ref 5–15)
BUN: 7 mg/dL (ref 6–20)
CO2: 25 mmol/L (ref 22–32)
Calcium: 8.8 mg/dL — ABNORMAL LOW (ref 8.9–10.3)
Chloride: 100 mmol/L (ref 98–111)
Creatinine, Ser: 0.72 mg/dL (ref 0.44–1.00)
GFR, Estimated: >60 mL/min (ref >60)
Glucose, Bld: 96 mg/dL (ref 70–99)
Potassium: 3.9 mmol/L (ref 3.5–5.1)
Sodium: 135 mmol/L (ref 135–145)
Total Bilirubin: 0.5 mg/dL (ref 0.3–1.2)
Total Protein: 6.9 g/dL (ref 6.5–8.1)

## 2022-01-01 LAB — CBC
HCT: 36.6 % (ref 36.0–46.0)
Hemoglobin: 11.8 g/dL — ABNORMAL LOW (ref 12.0–15.0)
MCH: 28.4 pg (ref 26.0–34.0)
MCHC: 32.2 g/dL (ref 30.0–36.0)
MCV: 88 fL (ref 80.0–100.0)
Platelets: 243 10*3/uL (ref 150–400)
RBC: 4.16 MIL/uL (ref 3.87–5.11)
RDW: 13 % (ref 11.5–15.5)
WBC: 10.8 10*3/uL — ABNORMAL HIGH (ref 4.0–10.5)
nRBC: 0 % (ref 0.0–0.2)

## 2022-01-01 LAB — LIPASE, BLOOD: Lipase: 37 U/L (ref 11–51)

## 2022-01-01 NOTE — Telephone Encounter (Signed)
?  Chief Complaint: Lower right abdominal pain ?Symptoms: pain lower right abd, radiates to back. Seen in ED 2 days ago, pain worse 9/10 not urinating as usual, "Stops" LGT nausea ?Frequency: 5 days ago ?Pertinent Negatives: Patient denies  ?Disposition: [x] ED /[] Urgent Care (no appt availability in office) / [] Appointment(In office/virtual)/ []  [facility]/ [] Home Care/ [] Refused Recommended Disposition /[] [facility]/ []  Follow-up with PCP ?Additional Notes: States will follow disposition. ?Reason for Disposition ? [1] SEVERE pain (e.g., excruciating) AND [2] present > 1 hour ? ?Answer Assessment - Initial Assessment Questions ?1. LOCATION: "Where does it hurt?"  ?    Lower right abd to back ?2. RADIATION: "Does the pain shoot anywhere else?" (e.g., chest, back) ?    To back ?3. ONSET: "When did the pain begin?" (e.g., minutes, hours or days ago)  ?    5 days ago ?4. SUDDEN: "Gradual or sudden onset?" ?    suddenly ?5. PATTERN "Does the pain come and go, or is it constant?" ?   - If constant: "Is it getting better, staying the same, or worsening?"  ?    (Note: Constant means the pain never goes away completely; most serious pain is constant and it progresses)  ?   - If intermittent: "How long does it last?" "Do you have pain now?" ?    (Note: Intermittent means the pain goes away completely between bouts) ?    *No Answer* ?6. SEVERITY: "How bad is the pain?"  (e.g., Scale 1-10; mild, moderate, or severe) ?  - MILD (1-3): doesn't interfere with normal activities, abdomen soft and not tender to touch  ?  - MODERATE (4-7): interferes with normal activities or awakens from sleep, abdomen tender to touch  ?  - SEVERE (8-10): excruciating pain, doubled over, unable to do any normal activities  ?    9/10 ?7. RECURRENT SYMPTOM: "Have you ever had this type of stomach pain before?" If Yes, ask: "When was the last time?" and "What happened that time?"  ?     ?8. CAUSE: "What do you think is causing  the stomach pain?" ?     ?9. RELIEVING/AGGRAVATING FACTORS: "What makes it better or worse?" (e.g., movement, antacids, bowel movement) ?     ?10. OTHER SYMPTOMS: "Do you have any other symptoms?" (e.g., back pain, diarrhea, fever, urination pain, vomiting) ?      Nausea  99.1 fever ? ?Protocols used: Abdominal Pain - Female-A-AH ? ?

## 2022-01-01 NOTE — ED Triage Notes (Signed)
Pt reports she was diagnosed with 2 kidney stones on 4/11 and the pain is not getting better and reports trouble urinating "I have the urge to urinate and sometimes I cant." RLQ abd pain that radiates to her back. ?

## 2022-01-01 NOTE — ED Provider Triage Note (Signed)
Emergency Medicine Provider Triage Evaluation Note ? ?Katelee Gimeno , a 41 y.o. female  was evaluated in triage.  Pt complains of RLQ pain along with urinary urgency and frequency and dysuria for the past 5 days. She was seen here on 12/30/21 and received an abdominal CT and labs.The patient reports the pain is not worsening, it just isn't getting any better.  ? ?Review of Systems  ?Positive:  ?Negative:  ? ?Physical Exam  ?BP 113/85 (BP Location: Right Arm)   Pulse 98   Temp 98.8 ?F (37.1 ?C) (Oral)   Resp 18   Ht 5\' 2"  (1.575 m)   Wt 68 kg   LMP 12/01/2021 (Approximate)   SpO2 95%   BMI 27.44 kg/m?  ?Gen:   Awake, no distress   ?Resp:  Normal effort  ?MSK:   Moves extremities without difficulty  ?Other:  RLQ tenderness, no CVA tenderness. Abdomen soft, no guarding or rebound.  ? ?Medical Decision Making  ?Medically screening exam initiated at 8:05 PM.  Appropriate orders placed.  Willodeen Howitt was informed that the remainder of the evaluation will be completed by another provider, this initial triage assessment does not replace that evaluation, and the importance of remaining in the ED until their evaluation is complete. ? ?CT findings showed renal stones in the poles of the bilateral kidneys and a normal appendix. Since this pain is unchanged, I will order [place] to rule out any GU issues.  ?  ?Sherrell Puller, PA-C ?01/01/22 2010 ? ?

## 2022-01-02 ENCOUNTER — Telehealth: Payer: Medicare Other | Admitting: Physician Assistant

## 2022-01-02 DIAGNOSIS — N200 Calculus of kidney: Secondary | ICD-10-CM | POA: Diagnosis not present

## 2022-01-02 LAB — I-STAT BETA HCG BLOOD, ED (MC, WL, AP ONLY): I-stat hCG, quantitative: <5 m[IU]/mL (ref <5)

## 2022-01-02 MED ORDER — TAMSULOSIN HCL 0.4 MG PO CAPS: 0.4000 mg | ORAL_CAPSULE | Freq: Every day | ORAL | 0 refills | Status: DC

## 2022-01-02 MED ORDER — IBUPROFEN 600 MG PO TABS: 600.0000 mg | ORAL_TABLET | Freq: Three times a day (TID) | ORAL | 0 refills | Status: DC | PRN

## 2022-01-02 MED ORDER — ONDANSETRON HCL 4 MG PO TABS: 4.0000 mg | ORAL_TABLET | Freq: Three times a day (TID) | ORAL | 0 refills | Status: DC | PRN

## 2022-01-02 NOTE — Progress Notes (Signed)
?Virtual Visit Consent  ? ?Felicia Sutton, you are scheduled for a virtual visit with a [facility] provider today.   ?  ?Just as with appointments in the office, your consent must be obtained to participate.  Your consent will be active for this visit and any virtual visit you may have with one of our providers in the next 365 days.   ?  ?If you have a MyChart account, a copy of this consent can be sent to you electronically.  All virtual visits are billed to your insurance company just like a traditional visit in the office.   ? ?As this is a virtual visit, video technology does not allow for your provider to perform a traditional examination.  This may limit your provider's ability to fully assess your condition.  If your provider identifies any concerns that need to be evaluated in person or the need to arrange testing (such as labs, EKG, etc.), we will make arrangements to do so.   ?  ?Although advances in technology are sophisticated, we cannot ensure that it will always work on either your end or our end.  If the connection with a video visit is poor, the visit may have to be switched to a telephone visit.  With either a video or telephone visit, we are not always able to ensure that we have a secure connection.    ? ?I need to obtain your verbal consent now.   Are you willing to proceed with your visit today?  ?  ?Felicia Sutton has provided verbal consent on 01/02/2022 for a virtual visit (video or telephone). ?  ?Mar Daring, PA-C  ? ?Date: 01/02/2022 5:35 PM ? ? ?Virtual Visit via Video Note  ? ?Felicia Sutton, Felicia Sutton, connected with  Felicia Sutton  ([id number], [birth year]) on 01/02/22 at  5:30 PM EDT by a video-enabled telemedicine application and verified that I am speaking with the correct person using two identifiers. ? ?Location: ?Patient: Virtual Visit Location Patient: Home ?Provider: Virtual Visit Location Provider: Home Office ?  ?I discussed the limitations of evaluation and management by  telemedicine and the availability of in person appointments. The patient expressed understanding and agreed to proceed.   ? ?History of Present Illness: ?Felicia Sutton is a 41 y.o. who identifies as a female who was assigned female at birth, and is being seen today for nausea with kidney stones.  Was seen in the ER on 12/30/21 and also seen again (but LWBS) on 01/01/22. She reports still having pain and now some associated nausea. Does report some urine hesitancy, but not consistent ("sometimes I can go, sometimes I can't"). Denies any hematuria, fevers, chills, bowel changes. ? ? ?Problems:  ?Patient Active Problem List  ? Diagnosis Date Noted  ? GAD (generalized anxiety disorder) 12/08/2021  ? Stimulant use disorder 12/08/2021  ? MDD (major depressive disorder), recurrent episode, severe ([place]) 11/30/2021  ? Polysubstance abuse ([place]) 07/05/2021  ? Hematemesis 01/31/2020  ? Abdominal pain, chronic, right upper quadrant   ? Nausea and vomiting in adult   ?  ?Allergies: No Known Allergies ?Medications:  ?Current Outpatient Medications:  ?  ibuprofen (ADVIL) 600 MG tablet, Take 1 tablet (600 mg total) by mouth every 8 (eight) hours as needed., Disp: 30 tablet, Rfl: 0 ?  ondansetron (ZOFRAN) 4 MG tablet, Take 1 tablet (4 mg total) by mouth every 8 (eight) hours as needed for nausea or vomiting., Disp: 20 tablet, Rfl: 0 ?  tamsulosin (FLOMAX) 0.4 MG CAPS  capsule, Take 1 capsule (0.4 mg total) by mouth daily., Disp: 30 capsule, Rfl: 0 ?  busPIRone (BUSPAR) 10 MG tablet, Take 1 tablet (10 mg total) by mouth 2 (two) times daily., Disp: 60 tablet, Rfl: 0 ?  FLUoxetine (PROZAC) 10 MG capsule, Take 3 capsules (30 mg total) by mouth daily., Disp: 90 capsule, Rfl: 0 ?  hydrOXYzine (ATARAX) 25 MG tablet, Take 1 tablet (25 mg total) by mouth 3 (three) times daily as needed for anxiety., Disp: 30 tablet, Rfl: 0 ?  metoCLOPramide (REGLAN) 5 MG tablet, Take 1-2 tablets (5-10 mg total) by mouth 3 (three) times daily with meals. Use  for 5-7 days during flares, Disp: 180 tablet, Rfl: 0 ?  mirtazapine (REMERON) 7.5 MG tablet, Take 1 tablet (7.5 mg total) by mouth at bedtime., Disp: 30 tablet, Rfl: 0 ?  pantoprazole (PROTONIX) 40 MG tablet, Take 1 tablet (40 mg total) by mouth daily., Disp: 30 tablet, Rfl: 0 ?  promethazine (PHENERGAN) 25 MG tablet, Take 1 tablet (25 mg total) by mouth every 6 (six) hours as needed for nausea or vomiting., Disp: 10 tablet, Rfl: 0 ?  traZODone (DESYREL) 100 MG tablet, Take 1 tablet (100 mg total) by mouth at bedtime., Disp: 30 tablet, Rfl: 0 ? ?Observations/Objective: ?Patient is well-developed, well-nourished in no acute distress.  ?Resting comfortably at home.  ?Head is normocephalic, atraumatic.  ?No labored breathing.  ?Speech is clear and coherent with logical content.  ?Patient is alert and oriented at baseline.  ? ? ?Assessment and Plan: ?1. Kidney stone ?- ondansetron (ZOFRAN) 4 MG tablet; Take 1 tablet (4 mg total) by mouth every 8 (eight) hours as needed for nausea or vomiting.  Dispense: 20 tablet; Refill: 0 ?- ibuprofen (ADVIL) 600 MG tablet; Take 1 tablet (600 mg total) by mouth every 8 (eight) hours as needed.  Dispense: 30 tablet; Refill: 0 ?- tamsulosin (FLOMAX) 0.4 MG CAPS capsule; Take 1 capsule (0.4 mg total) by mouth daily.  Dispense: 30 capsule; Refill: 0 ? ?- Will add zofran for nausea ?- Ibuprofen for pain ?- Flomax if passing a stone ?- Push fluids ?- Strict ER return precautions discussed, she voiced understanding ? ?Follow Up Instructions: ?I discussed the assessment and treatment plan with the patient. The patient was provided an opportunity to ask questions and all were answered. The patient agreed with the plan and demonstrated an understanding of the instructions.  A copy of instructions were sent to the patient via MyChart unless otherwise noted below.  ? ? ?The patient was advised to call back or seek an in-person evaluation if the symptoms worsen or if the condition fails to improve  as anticipated. ? ?Time:  ?I spent 10 minutes with the patient via telehealth technology discussing the above problems/concerns.   ? ?Mar Daring, PA-C ?

## 2022-01-02 NOTE — Patient Instructions (Signed)
?Felicia Sutton, thank you for joining Margaretann Loveless, PA-C for today's virtual visit.  While this provider is not your primary care provider (PCP), if your PCP is located in our provider database this encounter information will be shared with them immediately following your visit. ? ?Consent: ?(Patient) Felicia Sutton provided verbal consent for this virtual visit at the beginning of the encounter. ? ?Current Medications: ? ?Current Outpatient Medications:  ?  ibuprofen (ADVIL) 600 MG tablet, Take 1 tablet (600 mg total) by mouth every 8 (eight) hours as needed., Disp: 30 tablet, Rfl: 0 ?  ondansetron (ZOFRAN) 4 MG tablet, Take 1 tablet (4 mg total) by mouth every 8 (eight) hours as needed for nausea or vomiting., Disp: 20 tablet, Rfl: 0 ?  tamsulosin (FLOMAX) 0.4 MG CAPS capsule, Take 1 capsule (0.4 mg total) by mouth daily., Disp: 30 capsule, Rfl: 0 ?  busPIRone (BUSPAR) 10 MG tablet, Take 1 tablet (10 mg total) by mouth 2 (two) times daily., Disp: 60 tablet, Rfl: 0 ?  FLUoxetine (PROZAC) 10 MG capsule, Take 3 capsules (30 mg total) by mouth daily., Disp: 90 capsule, Rfl: 0 ?  hydrOXYzine (ATARAX) 25 MG tablet, Take 1 tablet (25 mg total) by mouth 3 (three) times daily as needed for anxiety., Disp: 30 tablet, Rfl: 0 ?  metoCLOPramide (REGLAN) 5 MG tablet, Take 1-2 tablets (5-10 mg total) by mouth 3 (three) times daily with meals. Use for 5-7 days during flares, Disp: 180 tablet, Rfl: 0 ?  mirtazapine (REMERON) 7.5 MG tablet, Take 1 tablet (7.5 mg total) by mouth at bedtime., Disp: 30 tablet, Rfl: 0 ?  pantoprazole (PROTONIX) 40 MG tablet, Take 1 tablet (40 mg total) by mouth daily., Disp: 30 tablet, Rfl: 0 ?  promethazine (PHENERGAN) 25 MG tablet, Take 1 tablet (25 mg total) by mouth every 6 (six) hours as needed for nausea or vomiting., Disp: 10 tablet, Rfl: 0 ?  traZODone (DESYREL) 100 MG tablet, Take 1 tablet (100 mg total) by mouth at bedtime., Disp: 30 tablet, Rfl: 0  ? ?Medications ordered in this  encounter:  ?Meds ordered this encounter  ?Medications  ? ondansetron (ZOFRAN) 4 MG tablet  ?  Sig: Take 1 tablet (4 mg total) by mouth every 8 (eight) hours as needed for nausea or vomiting.  ?  Dispense:  20 tablet  ?  Refill:  0  ?  Order Specific Question:   Supervising Provider  ?  Answer:   Eber Hong [[id number]]  ? ibuprofen (ADVIL) 600 MG tablet  ?  Sig: Take 1 tablet (600 mg total) by mouth every 8 (eight) hours as needed.  ?  Dispense:  30 tablet  ?  Refill:  0  ?  Order Specific Question:   Supervising Provider  ?  Answer:   Eber Hong [[id number]]  ? tamsulosin (FLOMAX) 0.4 MG CAPS capsule  ?  Sig: Take 1 capsule (0.4 mg total) by mouth daily.  ?  Dispense:  30 capsule  ?  Refill:  0  ?  Order Specific Question:   Supervising Provider  ?  Answer:   Eber Hong [[id number]]  ?  ? ?*If you need refills on other medications prior to your next appointment, please contact your pharmacy* ? ?Follow-Up: ?Call back or seek an in-person evaluation if the symptoms worsen or if the condition fails to improve as anticipated. ? ?Other Instructions ?Kidney Stones ?Kidney stones are rock-like masses that form inside of the kidneys. Kidneys are organs that make pee (  urine). A kidney stone may move into other parts of the urinary tract, including: ?The tubes that connect the kidneys to the bladder (ureters). ?The bladder. ?The tube that carries urine out of the body (urethra). ?Kidney stones can cause very bad pain and can block the flow of pee. The stone usually leaves your body (passes) through your pee. You may need to have a doctor take out the stone. ?What are the causes? ?Kidney stones may be caused by: ?A condition in which certain glands make too much parathyroid hormone (primary hyperparathyroidism). ?A buildup of a type of crystals in the bladder made of a chemical called uric acid. The body makes uric acid when you eat certain foods. ?Narrowing (stricture) of one or both of the ureters. ?A kidney blockage that you were  born with. ?Past surgery on the kidney or the ureters, such as gastric bypass surgery. ?What increases the risk? ?You are more likely to develop this condition if: ?You have had a kidney stone in the past. ?You have a family history of kidney stones. ?You do not drink enough water. ?You eat a diet that is high in protein, salt (sodium), or sugar. ?You are overweight or very overweight (obese). ?What are the signs or symptoms? ?Symptoms of a kidney stone may include: ?Pain in the side of the belly, right below the ribs (flank pain). Pain usually spreads (radiates) to the groin. ?Needing to pee often or right away (urgently). ?Pain when going pee (urinating). ?Blood in your pee (hematuria). ?Feeling like you may vomit (nauseous). ?Vomiting. ?Fever and chills. ?How is this treated? ?Treatment depends on the size, location, and makeup of the kidney stones. The stones will often pass out of the body through peeing. You may need to: ?Drink more fluid to help pass the stone. In some cases, you may be given fluids through an IV tube put into one of your veins at the hospital. ?Take medicine for pain. ?Make changes in your diet to help keep kidney stones from coming back. ?Sometimes, medical procedures are needed to remove a kidney stone. This may involve: ?A procedure to break up kidney stones using a beam of light (laser) or shock waves. ?Surgery to remove the kidney stones. ?Follow these instructions at home: ?Medicines ?Take over-the-counter and prescription medicines only as told by your doctor. ?Ask your doctor if the medicine prescribed to you requires you to avoid driving or using heavy machinery. ?Eating and drinking ?Drink enough fluid to keep your pee pale yellow. You may be told to drink at least 8-10 glasses of water each day. This will help you pass the stone. ?If told by your doctor, change your diet. This may include: ?Limiting how much salt you eat. ?Eating more fruits and vegetables. ?Limiting how much  meat, poultry, fish, and eggs you eat. ?Follow instructions from your doctor about eating or drinking restrictions. ?General instructions ?Collect pee samples as told by your doctor. You may need to collect a pee sample: ?24 hours after a stone comes out. ?8-12 weeks after a stone comes out, and every 6-12 months after that. ?Strain your pee every time you pee (urinate), for as long as told. Use the strainer that your doctor recommends. ?Do not throw out the stone. Keep it so that it can be tested by your doctor. ?Keep all follow-up visits as told by your doctor. This is important. You may need follow-up tests. ?How is this prevented? ?To prevent another kidney stone: ?Drink enough fluid to keep your pee  pale yellow. This is the best way to prevent kidney stones. ?Eat healthy foods. ?Avoid certain foods as told by your doctor. You may be told to eat less protein. ?Stay at a healthy weight. ?Where to find more information ?National Kidney Foundation (NKF): www.kidney.org ?[facility]): www.urologyhealth.org ?Contact a doctor if: ?You have pain that gets worse or does not get better with medicine. ?Get help right away if: ?You have a fever or chills. ?You get very bad pain. ?You get new pain in your belly (abdomen). ?You pass out (faint). ?You cannot pee. ?Summary ?Kidney stones are rock-like masses that form inside of the kidneys. ?Kidney stones can cause very bad pain and can block the flow of pee. ?The stones will often pass out of the body through peeing. ?Drink enough fluid to keep your pee pale yellow. ?This information is not intended to replace advice given to you by your health care provider. Make sure you discuss any questions you have with your health care provider. ?Document Revised: 05/12/2021 Document Reviewed: 05/12/2021 ?Elsevier Patient Education ? 2023 Elsevier Inc. ? ? ? ?If you have been instructed to have an in-person evaluation today at a local Urgent Care facility, please use  the link below. It will take you to a list of all of our available [facility], including address, phone number and hours of operation. Please do not delay care.  ?[facility] ? ?If y

## 2022-01-02 NOTE — ED Notes (Signed)
Patient states she is leaving. 

## 2022-01-06 ENCOUNTER — Telehealth: Payer: Medicare Other | Admitting: Emergency Medicine

## 2022-01-06 DIAGNOSIS — R110 Nausea: Secondary | ICD-10-CM

## 2022-01-06 DIAGNOSIS — R109 Unspecified abdominal pain: Secondary | ICD-10-CM

## 2022-01-06 NOTE — Patient Instructions (Signed)
?  [name], thank you for joining [name], NP for today's virtual visit.  While this provider is not your primary care provider (PCP), if your PCP is located in our provider database this encounter information will be shared with them immediately following your visit. ? ?Consent: ?(Patient) Felicia Sutton provided verbal consent for this virtual visit at the beginning of the encounter. ? ?Current Medications: ? ?Current Outpatient Medications:  ?  busPIRone (BUSPAR) 10 MG tablet, Take 1 tablet (10 mg total) by mouth 2 (two) times daily., Disp: 60 tablet, Rfl: 0 ?  FLUoxetine (PROZAC) 10 MG capsule, Take 3 capsules (30 mg total) by mouth daily., Disp: 90 capsule, Rfl: 0 ?  hydrOXYzine (ATARAX) 25 MG tablet, Take 1 tablet (25 mg total) by mouth 3 (three) times daily as needed for anxiety., Disp: 30 tablet, Rfl: 0 ?  ibuprofen (ADVIL) 600 MG tablet, Take 1 tablet (600 mg total) by mouth every 8 (eight) hours as needed., Disp: 30 tablet, Rfl: 0 ?  metoCLOPramide (REGLAN) 5 MG tablet, Take 1-2 tablets (5-10 mg total) by mouth 3 (three) times daily with meals. Use for 5-7 days during flares, Disp: 180 tablet, Rfl: 0 ?  mirtazapine (REMERON) 7.5 MG tablet, Take 1 tablet (7.5 mg total) by mouth at bedtime., Disp: 30 tablet, Rfl: 0 ?  ondansetron (ZOFRAN) 4 MG tablet, Take 1 tablet (4 mg total) by mouth every 8 (eight) hours as needed for nausea or vomiting., Disp: 20 tablet, Rfl: 0 ?  pantoprazole (PROTONIX) 40 MG tablet, Take 1 tablet (40 mg total) by mouth daily., Disp: 30 tablet, Rfl: 0 ?  promethazine (PHENERGAN) 25 MG tablet, Take 1 tablet (25 mg total) by mouth every 6 (six) hours as needed for nausea or vomiting., Disp: 10 tablet, Rfl: 0 ?  tamsulosin (FLOMAX) 0.4 MG CAPS capsule, Take 1 capsule (0.4 mg total) by mouth daily., Disp: 30 capsule, Rfl: 0 ?  traZODone (DESYREL) 100 MG tablet, Take 1 tablet (100 mg total) by mouth at bedtime., Disp: 30 tablet, Rfl: 0  ? ?Medications ordered in this encounter:   ?No orders of the defined types were placed in this encounter. ?  ? ?*If you need refills on other medications prior to your next appointment, please contact your pharmacy* ? ?Follow-Up: ?Call back or seek an in-person evaluation if the symptoms worsen or if the condition fails to improve as anticipated. ? ?Other Instructions ?Please seek in person care at an Urgent Care or Emergency Department ? ? ?If you have been instructed to have an in-person evaluation today at a local Urgent Care facility, please use the link below. It will take you to a list of all of our available [facility], including address, phone number and hours of operation. Please do not delay care.  ?[facility] ? ?If you or a family member do not have a primary care provider, use the link below to schedule a visit and establish care. When you choose a [facility] primary care physician or advanced practice provider, you gain a long-term partner in health. ?Find a Primary Care Provider ? ?Learn more about [facility]'s in-office and virtual care options: ?[facility] - Get Care Now  ?

## 2022-01-06 NOTE — Progress Notes (Signed)
?Virtual Visit Consent  ? ?Felicia Sutton, you are scheduled for a virtual visit with a [facility] provider today.   ?  ?Just as with appointments in the office, your consent must be obtained to participate.  Your consent will be active for this visit and any virtual visit you may have with one of our providers in the next 365 days.   ?  ?If you have a MyChart account, a copy of this consent can be sent to you electronically.  All virtual visits are billed to your insurance company just like a traditional visit in the office.   ? ?As this is a virtual visit, video technology does not allow for your provider to perform a traditional examination.  This may limit your provider's ability to fully assess your condition.  If your provider identifies any concerns that need to be evaluated in person or the need to arrange testing (such as labs, EKG, etc.), we will make arrangements to do so.   ?  ?Although advances in technology are sophisticated, we cannot ensure that it will always work on either your end or our end.  If the connection with a video visit is poor, the visit may have to be switched to a telephone visit.  With either a video or telephone visit, we are not always able to ensure that we have a secure connection.    ? ?I need to obtain your verbal consent now.   Are you willing to proceed with your visit today?  ?  ?Felicia Sutton has provided verbal consent on 01/06/2022 for a virtual visit (video or telephone). ?  ?Felicia Parsons, NP  ? ?Date: 01/06/2022 3:59 PM ? ? ?Virtual Visit via Video Note  ? ?Felicia Sutton, connected with  Felicia Sutton  ([id number], 07-22-1981) on 01/06/22 at  3:00 PM EDT by a video-enabled telemedicine application and verified that I am speaking with the correct person using two identifiers. ? ?Location: ?Patient: Virtual Visit Location Patient: Home ?Provider: Virtual Visit Location Provider: Home Office ?  ?I discussed the limitations of evaluation and management by telemedicine  and the availability of in person appointments. The patient expressed understanding and agreed to proceed.   ? ?History of Present Illness: ?Felicia Sutton is a 41 y.o. who identifies as a female who was assigned female at birth, and is being seen today for kidney stones.  Patient was seen in a video visit for the same problem last week, given Flomax, ibuprofen, and Zofran.  Patient reports she is out of medications and has not improved, symptoms are exactly the same as before.  Review of records shows 2 ER visits last week for the same problem. ? ?HPI: HPI  ?Problems:  ?Patient Active Problem List  ? Diagnosis Date Noted  ? GAD (generalized anxiety disorder) 12/08/2021  ? Stimulant use disorder 12/08/2021  ? MDD (major depressive disorder), recurrent episode, severe (HCC) 11/30/2021  ? Polysubstance abuse (HCC) 07/05/2021  ? Hematemesis 01/31/2020  ? Abdominal pain, chronic, right upper quadrant   ? Nausea and vomiting in adult   ?  ?Allergies: No Known Allergies ?Medications:  ?Current Outpatient Medications:  ?  busPIRone (BUSPAR) 10 MG tablet, Take 1 tablet (10 mg total) by mouth 2 (two) times daily., Disp: 60 tablet, Rfl: 0 ?  FLUoxetine (PROZAC) 10 MG capsule, Take 3 capsules (30 mg total) by mouth daily., Disp: 90 capsule, Rfl: 0 ?  hydrOXYzine (ATARAX) 25 MG tablet, Take 1 tablet (25 mg total) by  mouth 3 (three) times daily as needed for anxiety., Disp: 30 tablet, Rfl: 0 ?  ibuprofen (ADVIL) 600 MG tablet, Take 1 tablet (600 mg total) by mouth every 8 (eight) hours as needed., Disp: 30 tablet, Rfl: 0 ?  metoCLOPramide (REGLAN) 5 MG tablet, Take 1-2 tablets (5-10 mg total) by mouth 3 (three) times daily with meals. Use for 5-7 days during flares, Disp: 180 tablet, Rfl: 0 ?  mirtazapine (REMERON) 7.5 MG tablet, Take 1 tablet (7.5 mg total) by mouth at bedtime., Disp: 30 tablet, Rfl: 0 ?  ondansetron (ZOFRAN) 4 MG tablet, Take 1 tablet (4 mg total) by mouth every 8 (eight) hours as needed for nausea or  vomiting., Disp: 20 tablet, Rfl: 0 ?  pantoprazole (PROTONIX) 40 MG tablet, Take 1 tablet (40 mg total) by mouth daily., Disp: 30 tablet, Rfl: 0 ?  promethazine (PHENERGAN) 25 MG tablet, Take 1 tablet (25 mg total) by mouth every 6 (six) hours as needed for nausea or vomiting., Disp: 10 tablet, Rfl: 0 ?  tamsulosin (FLOMAX) 0.4 MG CAPS capsule, Take 1 capsule (0.4 mg total) by mouth daily., Disp: 30 capsule, Rfl: 0 ?  traZODone (DESYREL) 100 MG tablet, Take 1 tablet (100 mg total) by mouth at bedtime., Disp: 30 tablet, Rfl: 0 ? ?Observations/Objective: ?Patient is well-developed, well-nourished in no acute distress.  ?Resting comfortably  at home.  ?Head is normocephalic, atraumatic.  ?No labored breathing.  ?Speech is clear and coherent with logical content.  ?Patient is alert and oriented at baseline.  ? ? ?Assessment and Plan: ?1. Nausea ? ?2. Abdominal pain, unspecified abdominal location ? ?I let patient know she will need to be seen in person for this ongoing problem as this is her fourth visit in our health system for this complaint.  Patient agrees to seek in person care.  ? ?Follow Up Instructions: ?I discussed the assessment and treatment plan with the patient. The patient was provided an opportunity to ask questions and all were answered. The patient agreed with the plan and demonstrated an understanding of the instructions.  A copy of instructions were sent to the patient via MyChart unless otherwise noted below.  ? ?The patient was advised to call back or seek an in-person evaluation if the symptoms worsen or if the condition fails to improve as anticipated. ? ?Time:  ?I spent 5 minutes with the patient via telehealth technology discussing the above problems/concerns.   ? ?Felicia Parsons, NP ?

## 2022-01-07 ENCOUNTER — Ambulatory Visit
Admission: RE | Admit: 2022-01-07 | Discharge: 2022-01-07 | Disposition: A | Discharge status: Home / Self Care | Payer: Medicare Other | Source: Ambulatory Visit | Attending: Family Medicine | Admitting: Family Medicine

## 2022-01-07 ENCOUNTER — Other Ambulatory Visit: Payer: Self-pay

## 2022-01-07 ENCOUNTER — Other Ambulatory Visit (HOSPITAL_COMMUNITY): Payer: Self-pay

## 2022-01-07 VITALS — BP 133/85 | HR 75 | Temp 98.2°F | Resp 17

## 2022-01-07 DIAGNOSIS — R300 Dysuria: Secondary | ICD-10-CM | POA: Insufficient documentation

## 2022-01-07 DIAGNOSIS — R109 Unspecified abdominal pain: Secondary | ICD-10-CM | POA: Insufficient documentation

## 2022-01-07 DIAGNOSIS — N200 Calculus of kidney: Secondary | ICD-10-CM | POA: Diagnosis present

## 2022-01-07 LAB — POCT URINALYSIS DIP (MANUAL ENTRY)
Bilirubin, UA: NEGATIVE
Blood, UA: NEGATIVE
Glucose, UA: NEGATIVE mg/dL
Nitrite, UA: NEGATIVE
Spec Grav, UA: >=1.03 — AB (ref 1.010–1.025)
Urobilinogen, UA: 0.2 E.U./dL
pH, UA: 5.5 (ref 5.0–8.0)

## 2022-01-07 MED ORDER — KETOROLAC TROMETHAMINE 10 MG PO TABS: 10.0000 mg | ORAL_TABLET | Freq: Three times a day (TID) | ORAL | 0 refills | Status: DC | PRN

## 2022-01-07 MED ORDER — KETOROLAC TROMETHAMINE 10 MG PO TABS
10.0000 mg | ORAL_TABLET | Freq: Three times a day (TID) | ORAL | 0 refills | Status: DC | PRN
  Filled 2022-01-07: qty 18, 6d supply, fill #0

## 2022-01-07 MED ORDER — KETOROLAC TROMETHAMINE 30 MG/ML IJ SOLN
30.0000 mg | Freq: Once | INTRAMUSCULAR | Status: AC
  Administered 2022-01-07: 30 mg via INTRAMUSCULAR

## 2022-01-07 NOTE — ED Provider Notes (Signed)
?[facility] ? ? ? ?CSN: [id number] ?Arrival date & time: 01/07/22  1416 ? ? ?  ? ?History   ?Chief Complaint ?Chief Complaint  ?Patient presents with  ? Abdominal Pain  ? ? ?HPI ?Felicia Sutton is a 41 y.o. female.  ? ?HPI ?Patient presents today with bilateral flank pain due to kidney stones.  Patient was diagnosed on 12/30/2021 with nonobstructing kidney stones.  CT of the abdomen revealed nonobstructing bilateral renal stones measuring up to 5 mm in the right and 6 mm in the left.  She is currently taking Flomax and was prescribed ibuprofen.  She reports ibuprofen has not alleviated her pain.  She was not referred to urology and reports she has not seen a urologist but the pain appears to be worsening.  She denies any gross hematuria.  She endorses dysuria with every urination.  She is afebrile. ?Past Medical History:  ?Diagnosis Date  ? GERD (gastroesophageal reflux disease) 2017  ? Major depression 12/2019  ? ? ?Patient Active Problem List  ? Diagnosis Date Noted  ? GAD (generalized anxiety disorder) 12/08/2021  ? Stimulant use disorder 12/08/2021  ? MDD (major depressive disorder), recurrent episode, severe (HCC) 11/30/2021  ? Polysubstance abuse (HCC) 07/05/2021  ? Hematemesis 01/31/2020  ? Abdominal pain, chronic, right upper quadrant   ? Nausea and vomiting in adult   ? ? ?Past Surgical History:  ?Procedure Laterality Date  ? CHOLECYSTECTOMY  01/2019  ? ESOPHAGOGASTRODUODENOSCOPY (EGD) WITH PROPOFOL N/A 02/01/2020  ? Procedure: ESOPHAGOGASTRODUODENOSCOPY (EGD) WITH PROPOFOL;  Surgeon: Sherrilyn Rist, MD;  Location: [facility] ENDOSCOPY;  Service: Gastroenterology;  Laterality: N/A;  ? ? ?OB History   ?No obstetric history on file. ?  ? ? ? ?Home Medications   ? ?Prior to Admission medications   ?Medication Sig Start Date End Date Taking? Authorizing Provider  ?busPIRone (BUSPAR) 10 MG tablet Take 1 tablet (10 mg total) by mouth 2 (two) times daily. 12/08/21   Massengill, Harrold Donath, MD  ?FLUoxetine  (PROZAC) 10 MG capsule Take 3 capsules (30 mg total) by mouth daily. 12/09/21   Massengill, Harrold Donath, MD  ?hydrOXYzine (ATARAX) 25 MG tablet Take 1 tablet (25 mg total) by mouth 3 (three) times daily as needed for anxiety. 12/08/21   Massengill, Harrold Donath, MD  ?ketorolac (TORADOL) 10 MG tablet Take 1 tablet (10 mg total) by mouth 3 (three) times daily as needed. 01/07/22   Bing Neighbors, FNP  ?metoCLOPramide (REGLAN) 5 MG tablet Take 1-2 tablets (5-10 mg total) by mouth 3 (three) times daily with meals. Use for 5-7 days during flares 02/02/20 03/03/20  Elige Radon, MD  ?mirtazapine (REMERON) 7.5 MG tablet Take 1 tablet (7.5 mg total) by mouth at bedtime. 12/08/21   Massengill, Harrold Donath, MD  ?ondansetron (ZOFRAN) 4 MG tablet Take 1 tablet (4 mg total) by mouth every 8 (eight) hours as needed for nausea or vomiting. 01/02/22   Margaretann Loveless, PA-C  ?pantoprazole (PROTONIX) 40 MG tablet Take 1 tablet (40 mg total) by mouth daily. 02/02/20   Elige Radon, MD  ?promethazine (PHENERGAN) 25 MG tablet Take 1 tablet (25 mg total) by mouth every 6 (six) hours as needed for nausea or vomiting. 01/31/20   Petrucelli, Lelon Mast R, PA-C  ?tamsulosin (FLOMAX) 0.4 MG CAPS capsule Take 1 capsule (0.4 mg total) by mouth daily. 01/02/22   Margaretann Loveless, PA-C  ?traZODone (DESYREL) 100 MG tablet Take 1 tablet (100 mg total) by mouth at bedtime. 12/08/21   Phineas Inches, MD  ?  famotidine (PEPCID) 20 MG tablet Take 1 tablet (20 mg total) by mouth 2 (two) times daily. ?Patient not taking: Reported on 01/10/2020 01/02/20 01/31/20  Tilden Fossa, MD  ? ? ?Family History ?History reviewed. No pertinent family history. ? ?Social History ?Social History  ? ?Tobacco Use  ? Smoking status: Never  ? Smokeless tobacco: Never  ?Vaping Use  ? Vaping Use: Never used  ?Substance Use Topics  ? Alcohol use: Not Currently  ? Drug use: Not Currently  ? ? ? ?Allergies   ?Patient has no known allergies. ? ? ?Review of Systems ?Review of  Systems ?Pertinent negatives listed in HPI  ? ?Physical Exam ?Triage Vital Signs ?ED Triage Vitals  ?Enc Vitals Group  ?   BP 01/07/22 1439 133/85  ?   Pulse Rate 01/07/22 1439 75  ?   Resp 01/07/22 1439 17  ?   Temp 01/07/22 1439 98.2 ?F (36.8 ?C)  ?   Temp Source 01/07/22 1439 Oral  ?   SpO2 01/07/22 1439 97 %  ?   Weight --   ?   Height --   ?   Head Circumference --   ?   Peak Flow --   ?   Pain Score 01/07/22 1438 3  ?   Pain Loc --   ?   Pain Edu? --   ?   Excl. in GC? --   ? ?No data found. ? ?Updated Vital Signs ?BP 133/85 (BP Location: Left Arm)   Pulse 75   Temp 98.2 ?F (36.8 ?C) (Oral)   Resp 17   LMP  (LMP Unknown)   SpO2 97%  ? ?Visual Acuity ?Right Eye Distance:   ?Left Eye Distance:   ?Bilateral Distance:   ? ?Right Eye Near:   ?Left Eye Near:    ?Bilateral Near:    ? ?Physical Exam ?Constitutional:   ?   Appearance: She is well-developed.  ?HENT:  ?   Head: Normocephalic and atraumatic.  ?Eyes:  ?   Extraocular Movements: Extraocular movements intact.  ?Cardiovascular:  ?   Rate and Rhythm: Normal rate and regular rhythm.  ?Pulmonary:  ?   Effort: Pulmonary effort is normal.  ?   Breath sounds: Normal breath sounds.  ?Abdominal:  ?   Tenderness: There is right CVA tenderness and left CVA tenderness. There is no guarding or rebound.  ?   Hernia: No hernia is present.  ?Skin: ?   General: Skin is warm and dry.  ?   Capillary Refill: Capillary refill takes less than 2 seconds.  ?Neurological:  ?   Mental Status: She is alert.  ?Psychiatric:     ?   Mood and Affect: Mood normal.     ?   Behavior: Behavior normal.  ? ? ? ?UC Treatments / Results  ?Labs ?(all labs ordered are listed, but only abnormal results are displayed) ?Labs Reviewed  ?POCT URINALYSIS DIP (MANUAL ENTRY) - Abnormal; Notable for the following components:  ?    Result Value  ? Clarity, UA hazy (*)   ? Ketones, POC UA trace (5) (*)   ? Spec Grav, UA >=1.030 (*)   ? Protein Ur, POC trace (*)   ? Leukocytes, UA Trace (*)   ? All other  components within normal limits  ?URINE CULTURE  ? ? ?EKG ? ? ?Radiology ?No results found. ? ?Procedures ?Procedures (including critical care time) ? ?Medications Ordered in UC ?Medications  ?ketorolac (TORADOL) 30 MG/ML injection 30  mg (30 mg Intramuscular Given 01/07/22 1511)  ? ? ?Initial Impression / Assessment and Plan / UC Course  ?I have reviewed the triage vital signs and the nursing notes. ? ?Pertinent labs & imaging results that were available during my care of the patient were reviewed by me and considered in my medical decision making (see chart for details). ? ?  ?Renal stones, bilateral flank pain [facility]. ?Patient advised to continue Flomax and I have prescribed a short course of Toradol. Patient also received an IM injection of Toradol while here in clinic. Strict ER precautions given if any severe pain develops or gross hematuria develops. ?Final Clinical Impressions(s) / UC Diagnoses  ? ?Final diagnoses:  ?Dysuria  ?Renal stones  ?Bilateral flank pain  ? ? ? ?Discharge Instructions   ? ?  ?You received a Toradol IM injection here in clinic.  Do not take another dose of Toradol until after 11:00 PM.  You may take every 8 hours as needed.  Continue your Flomax.  I have placed a referral [facility].  They will contact you once they receive the referral.  However their information is listed in your discharge paperwork and you can contact them tomorrow. ? ? ? ? ?ED Prescriptions   ? ? Medication Sig Dispense Auth. Provider  ? ketorolac (TORADOL) 10 MG tablet  (Status: Discontinued) Take 1 tablet (10 mg total) by mouth 3 (three) times daily as needed. 18 tablet Bing NeighborsHarris, Diamonte Stavely S, FNP  ? ketorolac (TORADOL) 10 MG tablet Take 1 tablet (10 mg total) by mouth 3 (three) times daily as needed. 18 tablet Bing NeighborsHarris, Shandrell Boda S, FNP  ? ?  ? ?PDMP not reviewed this encounter. ?  ?Bing NeighborsHarris, Kanye Depree S, FNP ?01/07/22 1520 ? ?

## 2022-01-07 NOTE — ED Triage Notes (Signed)
Pt presents with c.o abdominal pain and painful urination.  ?

## 2022-01-07 NOTE — Discharge Instructions (Addendum)
You received a Toradol IM injection here in clinic.  Do not take another dose of Toradol until after 11:00 PM.  You may take every 8 hours as needed.  Continue your Flomax.  I have placed a referral for you to be seen at [facility] urology.  They will contact you once they receive the referral.  However their information is listed in your discharge paperwork and you can contact them tomorrow. ?

## 2022-01-08 LAB — URINE CULTURE: Special Requests: NORMAL

## 2022-01-09 ENCOUNTER — Ambulatory Visit: Payer: Medicare Other | Admitting: Nurse Practitioner

## 2022-01-12 ENCOUNTER — Other Ambulatory Visit: Payer: Self-pay

## 2022-01-12 ENCOUNTER — Other Ambulatory Visit: Payer: Self-pay | Admitting: Family Medicine

## 2022-01-14 ENCOUNTER — Telehealth: Payer: Medicare Other | Admitting: Physician Assistant

## 2022-01-14 ENCOUNTER — Encounter: Payer: Self-pay | Admitting: Physician Assistant

## 2022-01-14 ENCOUNTER — Encounter: Payer: Self-pay | Admitting: Urology

## 2022-01-14 ENCOUNTER — Other Ambulatory Visit: Payer: Self-pay | Admitting: Physician Assistant

## 2022-01-14 ENCOUNTER — Ambulatory Visit (INDEPENDENT_AMBULATORY_CARE_PROVIDER_SITE_OTHER): Payer: Medicare Other | Admitting: Urology

## 2022-01-14 VITALS — BP 127/86 | HR 84 | Ht 62.0 in | Wt 175.0 lb

## 2022-01-14 DIAGNOSIS — N200 Calculus of kidney: Secondary | ICD-10-CM | POA: Diagnosis not present

## 2022-01-14 DIAGNOSIS — R109 Unspecified abdominal pain: Secondary | ICD-10-CM

## 2022-01-14 DIAGNOSIS — R112 Nausea with vomiting, unspecified: Secondary | ICD-10-CM | POA: Diagnosis not present

## 2022-01-14 DIAGNOSIS — G8929 Other chronic pain: Secondary | ICD-10-CM

## 2022-01-14 LAB — URINALYSIS, COMPLETE
Bilirubin, UA: NEGATIVE
Glucose, UA: NEGATIVE
Ketones, UA: NEGATIVE
Nitrite, UA: NEGATIVE
Protein,UA: NEGATIVE
RBC, UA: NEGATIVE
Specific Gravity, UA: 1.025 (ref 1.005–1.030)
Urobilinogen, Ur: 0.2 mg/dL (ref 0.2–1.0)
pH, UA: 5.5 (ref 5.0–7.5)

## 2022-01-14 LAB — MICROSCOPIC EXAMINATION

## 2022-01-14 NOTE — Progress Notes (Signed)
[name] are scheduled for a virtual visit with your provider today.   ? ?Just as we do with appointments in the office, we must obtain your consent to participate.  Your consent will be active for this visit and any virtual visit you may have with one of our providers in the next 365 days.   ? ?If you have a MyChart account, I can also send a copy of this consent to you electronically.  All virtual visits are billed to your insurance company just like a traditional visit in the office.  As this is a virtual visit, video technology does not allow for your provider to perform a traditional examination.  This may limit your provider's ability to fully assess your condition.  If your provider identifies any concerns that need to be evaluated in person or the need to arrange testing such as labs, EKG, etc, we will make arrangements to do so.   ? ?Although advances in technology are sophisticated, we cannot ensure that it will always work on either your end or our end.  If the connection with a video visit is poor, we may have to switch to a telephone visit.  With either a video or telephone visit, we are not always able to ensure that we have a secure connection.   I need to obtain your verbal consent now.   Are you willing to proceed with your visit today?  ? ?Felicia Sutton has provided verbal consent on 01/14/2022 for a virtual visit (video or telephone). ? ? ?Felicia Karwowski Manfred Shirts, PA-C ?01/14/2022  4:53 PM ? ? ?Date:  01/14/2022  ? ?ID:  Felicia Sutton, DOB 1981/03/17, MRN [id number] ? ?Patient Location: Home ?Provider Location: Home Office ? ? ?Participants: Patient and Provider for Visit and Wrap up ? ?Method of visit: Video  ?Location of Patient: Home ?Location of Provider: Home Office ?Consent was obtain for visit over the video. ?Services rendered by provider: Visit was performed via video ? ?A video enabled telemedicine application was used and I verified that I am speaking with the correct person using two  identifiers. ? ?PCP:  Pcp, No  ? ?Chief Complaint:  nv ? ?History of Present Illness:   ? ?Felicia Sutton is a 41 y.o. female with history as stated below. ?Presents video telehealth for an acute care visit ? ?Pt reports she has had nv and left flank pain x4 weeks. She has been to the ED multiple times for eval and has had CT and [place] completed. She was noted to have bilat renal stones that were nonobstructing. She f/u with urology today and was told her pain was unlikely related to her stones. She had a negative ua today. Pt is requesting a referral to a new physician for a second opinion. ? ?Past Medical, Surgical, Social History, Allergies, and Medications have been Reviewed. ? ?Past Medical History:  ?Diagnosis Date  ? GERD (gastroesophageal reflux disease) 2017  ? Major depression 12/2019  ? ? ?No outpatient medications have been marked as taking for the 01/14/22 encounter (Video Visit) with Tailynn Armetta S, PA-C.  ?  ? ?Allergies:   Patient has no known allergies.  ? ?ROS ?See HPI for history of present illness. ? ?Physical Exam ?Vitals reviewed.  ?Constitutional:   ?   Appearance: Normal appearance.  ?HENT:  ?   Head: Normocephalic.  ?Neurological:  ?   Mental Status: She is alert.  ? ?MDM: Pt c/o nv and flank pain. Has had multiple evaluations for same  and is requesting referral to a new physician for a second opinion. Advised that I cannot give a referral but that she can reach out to [facility] if she wishes to have a second opinion. I reiterated what was found on her prior imaging and advised that her complaint is unable to be fully evaluated via telehealth as I cannot complete a physical exam/labs. Advised her to seek in person eval if she is in significant pain. ?          ? ?There are no diagnoses linked to this encounter. ? ? ?Time:   ?Today, I have spent 8 minutes with the patient with telehealth technology discussing the above problems, reviewing the chart, previous notes, medications and  orders.  ? ? ?Tests Ordered: ?No orders of the defined types were placed in this encounter. ? ? ?Medication Changes: ?No orders of the defined types were placed in this encounter. ? ? ? ?Disposition:  Follow up  ?Signed, ?Cyntha Brickman Manfred Shirts, PA-C  ?01/14/2022 4:53 PM    ? ? ?

## 2022-01-14 NOTE — Patient Instructions (Signed)
?  [name], thank you for joining Karrie Meres, PA-C for today's virtual visit.  While this provider is not your primary care provider (PCP), if your PCP is located in our provider database this encounter information will be shared with them immediately following your visit. ? ?Consent: ?(Patient) Mykell Genao provided verbal consent for this virtual visit at the beginning of the encounter. ? ?Current Medications: ? ?Current Outpatient Medications:  ?  busPIRone (BUSPAR) 10 MG tablet, Take 1 tablet (10 mg total) by mouth 2 (two) times daily., Disp: 60 tablet, Rfl: 0 ?  FLUoxetine (PROZAC) 10 MG capsule, Take 3 capsules (30 mg total) by mouth daily., Disp: 90 capsule, Rfl: 0 ?  hydrOXYzine (ATARAX) 25 MG tablet, Take 1 tablet (25 mg total) by mouth 3 (three) times daily as needed for anxiety., Disp: 30 tablet, Rfl: 0 ?  ketorolac (TORADOL) 10 MG tablet, Take 1 tablet (10 mg total) by mouth 3 (three) times daily as needed., Disp: 18 tablet, Rfl: 0 ?  mirtazapine (REMERON) 7.5 MG tablet, Take 1 tablet (7.5 mg total) by mouth at bedtime., Disp: 30 tablet, Rfl: 0 ?  ondansetron (ZOFRAN) 4 MG tablet, Take 1 tablet (4 mg total) by mouth every 8 (eight) hours as needed for nausea or vomiting., Disp: 20 tablet, Rfl: 0 ?  traZODone (DESYREL) 100 MG tablet, Take 1 tablet (100 mg total) by mouth at bedtime., Disp: 30 tablet, Rfl: 0  ? ?Medications ordered in this encounter:  ?No orders of the defined types were placed in this encounter. ?  ? ?*If you need refills on other medications prior to your next appointment, please contact your pharmacy* ? ?Follow-Up: ?Call back or seek an in-person evaluation if the symptoms worsen or if the condition fails to improve as anticipated. ? ?Other Instructions ?Please seek an in person evaluation for your flank pain, nausea and vomiting.  ? ? ?If you have been instructed to have an in-person evaluation today at a local Urgent Care facility, please use the link below. It will take  you to a list of all of our available [facility], including address, phone number and hours of operation. Please do not delay care.  ?[facility] ? ?If you or a family member do not have a primary care provider, use the link below to schedule a visit and establish care. When you choose a [facility] primary care physician or advanced practice provider, you gain a long-term partner in health. ?Find a Primary Care Provider ? ?Learn more about [place] [place]'s in-office and virtual care options: ?[place] - Get Care Now ? ?

## 2022-01-14 NOTE — Progress Notes (Signed)
? ?01/14/22 ?8:25 AM  ? ?Felicia Sutton ?1980-09-28 ?SN:[id number] ? ?CC: Back pain, nausea, nonobstructing renal stones ? ?HPI: ?I saw Felicia Sutton for the above issues today.  She has a history of depression, substance abuse, and anxiety.  She has had 3 to 4 weeks of bilateral back pain as well as some nausea, and has had multiple ER visits over the last 2 weeks.  She reports her pain seems to be worse with getting up and moving around, and certain foods can make her nausea worse.  She was evaluated in the ER on 12/30/2021 with a CT abdomen and pelvis with contrast which showed small nonobstructing stones bilaterally with no hydronephrosis, no renal pelvis stones, contaminated urinalysis with 11-20 squamous cells, 6-10 WBCs, few bacteria, 0-5 RBCs, small leukocytes, nitrite negative and was discharged home.  On 01/01/2022 she had a pelvic ultrasound that showed a small volume of free fluid within the pelvis but likely physiologic, and no other pelvic abnormalities.  She returned again to the ER on 01/07/2022 with ongoing pain and was referred to urology.  She denies any dysuria, gross hematuria, or other urinary symptoms.  She denies any fevers or chills.  She thinks she may have had kidney stones before, but has never required surgery. ? ?Urinalysis today benign and contaminated with greater than 10 epithelial cells, 0-2 RBCs, 6-10 WBCs, many bacteria, 1+ leukocytes. ? ?On chart review and Care Everywhere, she has had multiple ER visits at outside hospitals for abdominal pain, all with CTs showing unchanged nonobstructive stones with no ureteral stones or hydronephrosis. ? ? ?PMH: ?Past Medical History:  ?Diagnosis Date  ? GERD (gastroesophageal reflux disease) 2017  ? Major depression 12/2019  ? ? ?Surgical History: ?Past Surgical History:  ?Procedure Laterality Date  ? CHOLECYSTECTOMY  01/2019  ? ESOPHAGOGASTRODUODENOSCOPY (EGD) WITH PROPOFOL N/A 02/01/2020  ? Procedure: ESOPHAGOGASTRODUODENOSCOPY (EGD) WITH PROPOFOL;   Surgeon: Doran Stabler, MD;  Location: [facility];  Service: Gastroenterology;  Laterality: N/A;  ? ? ? ?Social History:  reports that she has never smoked. She has never been exposed to tobacco smoke. She has never used smokeless tobacco. She reports that she does not currently use alcohol. She reports that she does not currently use drugs. ? ?Physical Exam: ?BP 127/86   Pulse 84   Ht 5\' 2"  (1.575 m)   Wt 175 lb (79.4 kg)   LMP  (LMP Unknown)   BMI 32.01 kg/m?   ? ?Constitutional:  Alert and oriented, No acute distress. ?Cardiovascular: No clubbing, cyanosis, or edema. ?Respiratory: Normal respiratory effort, no increased work of breathing. ?GI: Abdomen is soft, nontender, nondistended, no abdominal masses ? ?Laboratory Data: ?Reviewed, see HPI ? ?Pertinent Imaging: ?I have personally viewed and interpreted the recent CT scan dated 01/01/2022 showing no ureteral stones or hydronephrosis, small and stable nonobstructive midpole renal stones. ? ?Assessment & Plan:   ?41 year old female with a number of co-morbidities and confounding social factors who has had really chronic abdominal pain over the last few months.  She has had numerous ER visits at outside facilities as well as our hospital, all with CT scans that showed no ureteral stones or hydronephrosis, and stable small nonobstructive midpole stones, with the largest measuring 5 mm.  Her pain moves from side to side and can be bilateral, and seems to be worse with physical activity, which would not be consistent with pain from nephrolithiasis or renal colic. ? ?I had a very frank conversation with the patient today that  I do not think her nonobstructive stones are the cause of her flank pain and intermittent nausea.  We discussed other strategies for back pain including heat, icing, stretching and exercise, and NSAIDs.  Unfortunately I think bilateral ureteroscopy and laser lithotripsy for removal of her stones would not improve her symptoms, and I  suspect she would not tolerate ureteral stents well with exacerbation of her pain and discomfort.  ? ?I recommended discontinuing the Flomax as she does not have any ureteral stones or hydronephrosis ? ?-Non-obstructing stable renal stones very unlikely to be the cause of her bilateral flank pain and nausea ?-Follow-up in 6 weeks symptom check and KUB, can re-discuss bilateral ureteroscopy for nonobstructive stones but would be extremely hesitant to offer her surgery with very low likelihood that these are the etiology of her symptoms ? ?I spent 45 total minutes on the day of the encounter including pre-visit review of the medical record, face-to-face time with the patient, and post visit ordering of labs/imaging/tests.  Extensive review of outside ER records. ? ? ?Nickolas Madrid, MD ?01/14/2022 ? ?[facility] ?[address] ?[place],  [address] ?[phone] ? ?

## 2022-01-14 NOTE — Patient Instructions (Addendum)
Your CT scan does not show any evidence of kidney stones causing blockage that would explain your back pain and symptoms.  Try to avoid strenuous activities that worsen your back pain, but this may be musculoskeletal or nerve related. ? ?Acute Back Pain, Adult ?Acute back pain is sudden and usually short-lived. It is often caused by an injury to the muscles and tissues in the back. The injury may result from: ?A muscle, tendon, or ligament getting overstretched or torn. Ligaments are tissues that connect bones to each other. Lifting something improperly can cause a back strain. ?Wear and tear (degeneration) of the spinal disks. Spinal disks are circular tissue that provide cushioning between the bones of the spine (vertebrae). ?Twisting motions, such as while playing sports or doing yard work. ?A hit to the back. ?Arthritis. ?You may have a physical exam, lab tests, and imaging tests to find the cause of your pain. Acute back pain usually goes away with rest and home care. ?Follow these instructions at home: ?Managing pain, stiffness, and swelling ?Take over-the-counter and prescription medicines only as told by your health care provider. Treatment may include medicines for pain and inflammation that are taken by mouth or applied to the skin, or muscle relaxants. ?Your health care provider may recommend applying ice during the first 24-48 hours after your pain starts. To do this: ?Put ice in a plastic bag. ?Place a towel between your skin and the bag. ?Leave the ice on for 20 minutes, 2-3 times a day. ?Remove the ice if your skin turns bright red. This is very important. If you cannot feel pain, heat, or cold, you have a greater risk of damage to the area. ?If directed, apply heat to the affected area as often as told by your health care provider. Use the heat source that your health care provider recommends, such as a moist heat pack or a heating pad. ?Place a towel between your skin and the heat source. ?Leave the  heat on for 20-30 minutes. ?Remove the heat if your skin turns bright red. This is especially important if you are unable to feel pain, heat, or cold. You have a greater risk of getting burned. ?Activity ? ?Do not stay in bed. Staying in bed for more than 1-2 days can delay your recovery. ?Sit up and stand up straight. Avoid leaning forward when you sit or hunching over when you stand. ?If you work at a desk, sit close to it so you do not need to lean over. Keep your chin tucked in. Keep your neck drawn back, and keep your elbows bent at a 90-degree angle (right angle). ?Sit high and close to the steering wheel when you drive. Add lower back (lumbar) support to your car seat, if needed. ?Take short walks on even surfaces as soon as you are able. Try to increase the length of time you walk each day. ?Do not sit, drive, or stand in one place for more than 30 minutes at a time. Sitting or standing for long periods of time can put stress on your back. ?Do not drive or use heavy machinery while taking prescription pain medicine. ?Use proper lifting techniques. When you bend and lift, use positions that put less stress on your back: ?[place] your knees. ?Keep the load close to your body. ?Avoid twisting. ?Exercise regularly as told by your health care provider. Exercising helps your back heal faster and helps prevent back injuries by keeping muscles strong and flexible. ?Work with a physical therapist  to make a safe exercise program, as recommended by your health care provider. Do any exercises as told by your physical therapist. ?Lifestyle ?Maintain a healthy weight. Extra weight puts stress on your back and makes it difficult to have good posture. ?Avoid activities or situations that make you feel anxious or stressed. Stress and anxiety increase muscle tension and can make back pain worse. Learn ways to manage anxiety and stress, such as through exercise. ?General instructions ?Sleep on a firm mattress in a comfortable  position. Try lying on your side with your knees slightly bent. If you lie on your back, put a pillow under your knees. ?Keep your head and neck in a straight line with your spine (neutral position) when using electronic equipment like smartphones or pads. To do this: ?Raise your smartphone or pad to look at it instead of bending your head or neck to look down. ?Put the smartphone or pad at the level of your face while looking at the screen. ?Follow your treatment plan as told by your health care provider. This may include: ?Cognitive or behavioral therapy. ?Acupuncture or massage therapy. ?Meditation or yoga. ?Contact a health care provider if: ?You have pain that is not relieved with rest or medicine. ?You have increasing pain going down into your legs or buttocks. ?Your pain does not improve after 2 weeks. ?You have pain at night. ?You lose weight without trying. ?You have a fever or chills. ?You develop nausea or vomiting. ?You develop abdominal pain. ?Get help right away if: ?You develop new bowel or bladder control problems. ?You have unusual weakness or numbness in your arms or legs. ?You feel faint. ?These symptoms may represent a serious problem that is an emergency. Do not wait to see if the symptoms will go away. Get medical help right away. Call your local emergency services (911 in the [place] Do not drive yourself to the hospital. ?Summary ?Acute back pain is sudden and usually short-lived. ?Use proper lifting techniques. When you bend and lift, use positions that put less stress on your back. ?Take over-the-counter and prescription medicines only as told by your health care provider, and apply heat or ice as told. ?This information is not intended to replace advice given to you by your health care provider. Make sure you discuss any questions you have with your health care provider. ?Document Revised: 11/29/2020 Document Reviewed: 11/29/2020 ?Elsevier Patient Education ? 2023 [employer] ? ?

## 2022-01-15 ENCOUNTER — Ambulatory Visit: Payer: Medicare Other | Admitting: Urology

## 2022-01-16 ENCOUNTER — Ambulatory Visit: Payer: Medicare Other | Admitting: Nurse Practitioner

## 2022-01-20 ENCOUNTER — Ambulatory Visit: Payer: Medicare Other | Admitting: Nurse Practitioner

## 2022-01-21 ENCOUNTER — Ambulatory Visit: Payer: Medicare Other | Admitting: Urology

## 2022-01-23 ENCOUNTER — Ambulatory Visit: Payer: Medicare Other | Admitting: Internal Medicine

## 2022-01-29 ENCOUNTER — Other Ambulatory Visit: Payer: Self-pay | Admitting: Physician Assistant

## 2022-01-29 DIAGNOSIS — N200 Calculus of kidney: Secondary | ICD-10-CM

## 2022-02-25 ENCOUNTER — Ambulatory Visit: Payer: Medicare Other | Admitting: Urology

## 2022-03-11 ENCOUNTER — Other Ambulatory Visit (HOSPITAL_COMMUNITY): Payer: Self-pay

## 2023-09-21 IMAGING — CT CT ABD-PELV W/ CM
2 of 5 series · 16 of 46 positions shown, 18 images · IV contrast (Omni 300)
Comparison: CT abdomen and pelvis without contrast 08/27/2018

CLINICAL DATA: Right lower quadrant abdominal pain for 4 days. No
nausea vomiting or diarrhea.

EXAM:
CT ABDOMEN AND PELVIS WITH CONTRAST
TECHNIQUE: Multidetector CT imaging of the abdomen and pelvis was performed
using the standard protocol following bolus administration of
intravenous contrast.

[Series 3: a/p w/ 5mm · axial · 0.98mm/px · z∈[+784,+1249]mm · 13 of 105 slices shown, 15 images]
[im 6/105  soft-tissue]
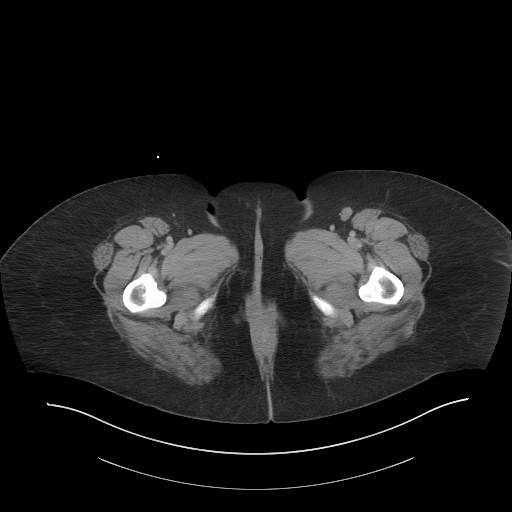
[im 6/105  bone]
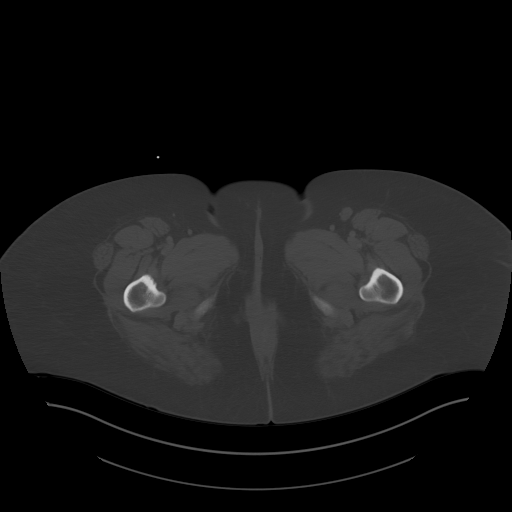
[im 16/105  soft-tissue]
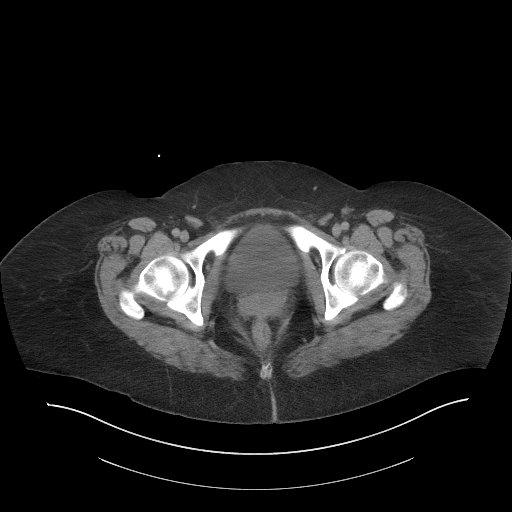
[im 21/105  soft-tissue]
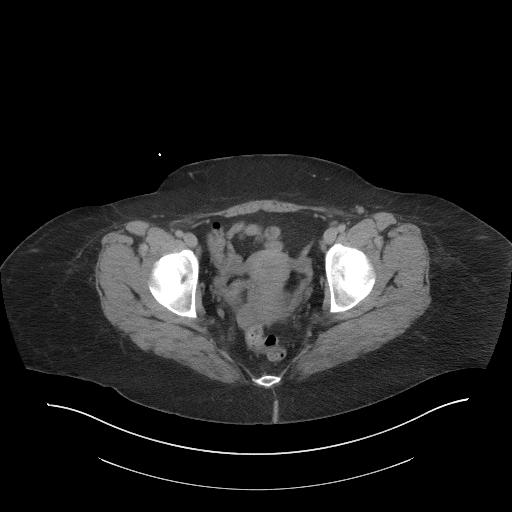
[im 32/105  soft-tissue]
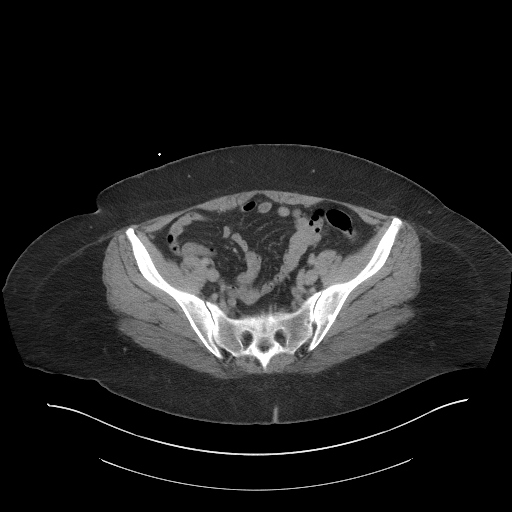
[im 37/105  soft-tissue]
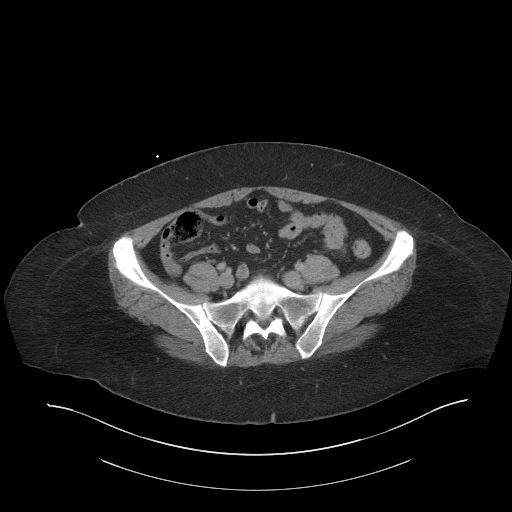
[im 47/105  soft-tissue]
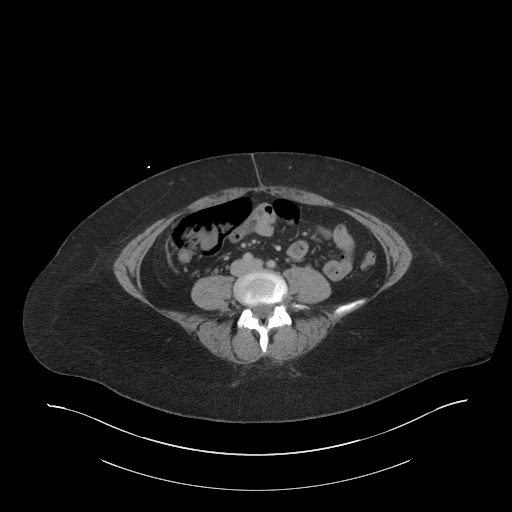
[im 53/105  soft-tissue]
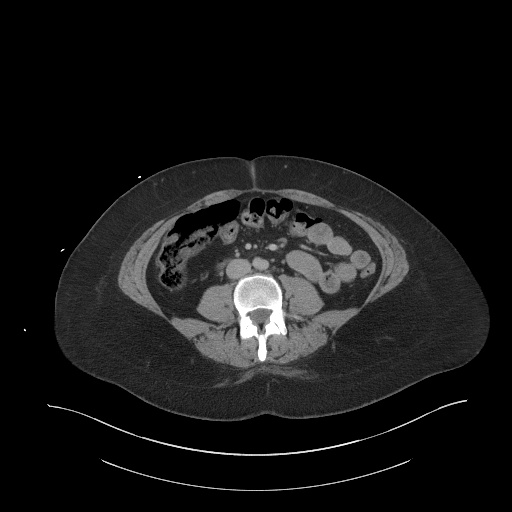
[im 58/105  soft-tissue]
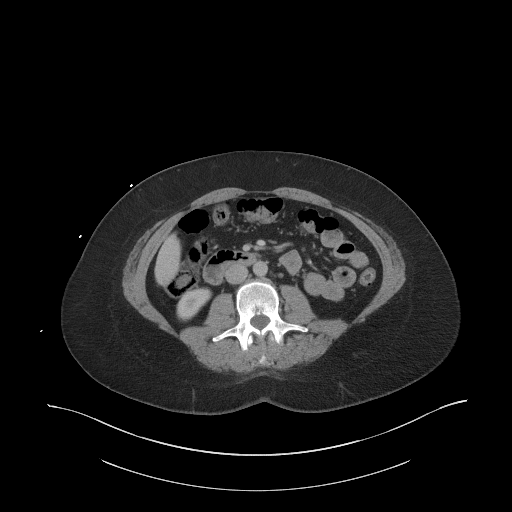
[im 68/105  soft-tissue]
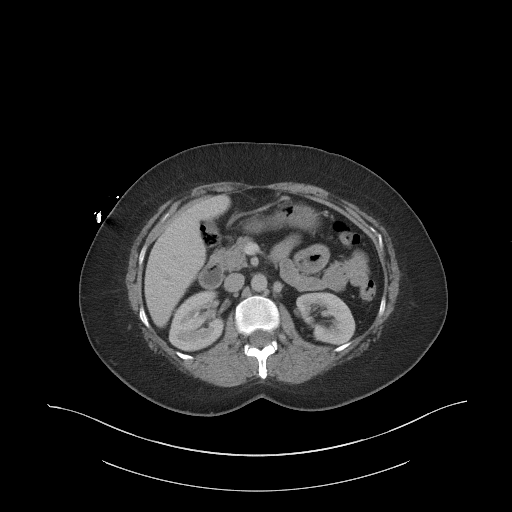
[im 68/105  bone]
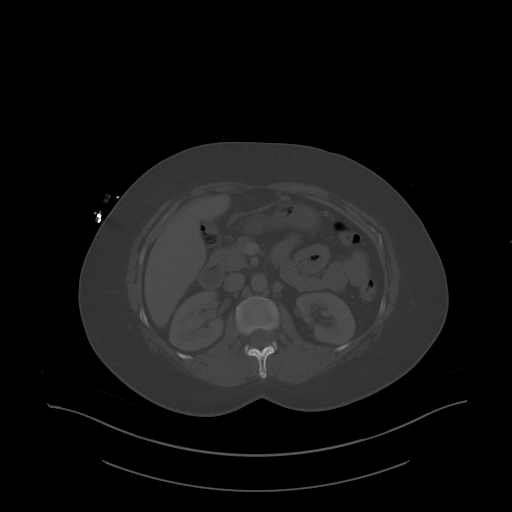
[im 73/105  soft-tissue]
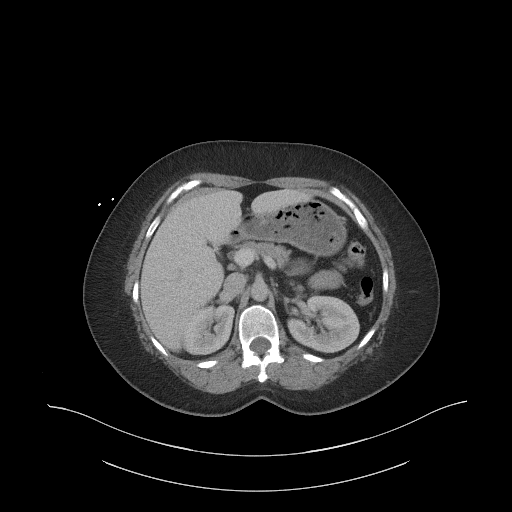
[im 84/105  soft-tissue]
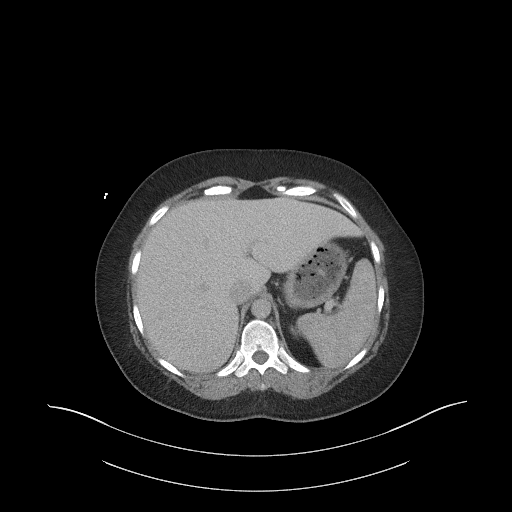
[im 89/105  soft-tissue]
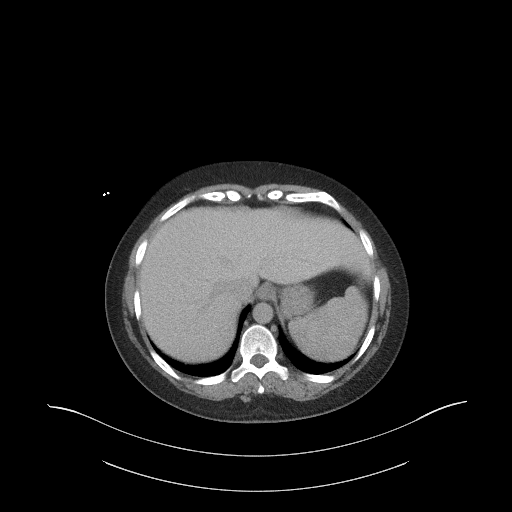
[im 99/105  soft-tissue]
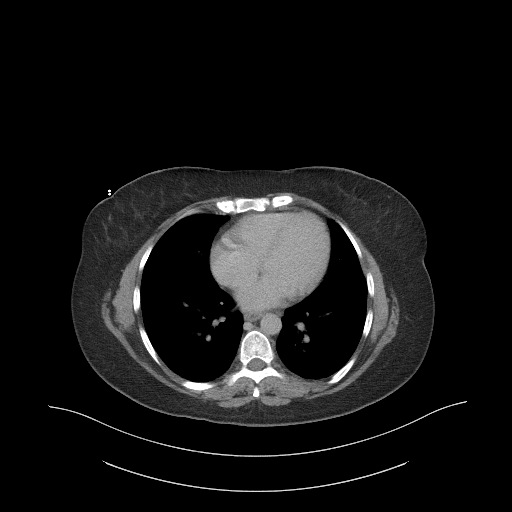

[Series 6: a/p w/ cor · coronal · 0.94mm/px · 3 of 138 slices shown]
[im 46/138  soft-tissue]
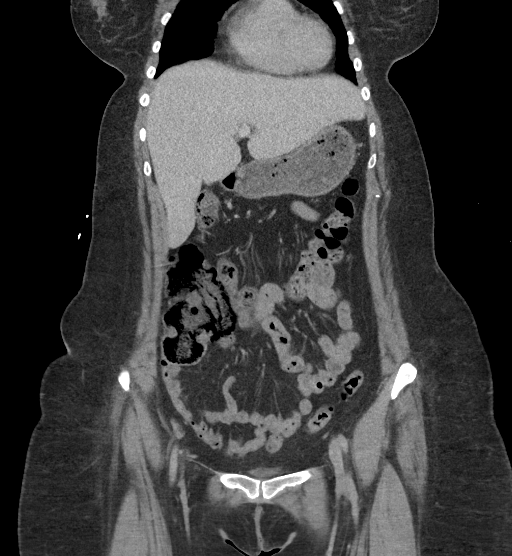
[im 61/138  soft-tissue]
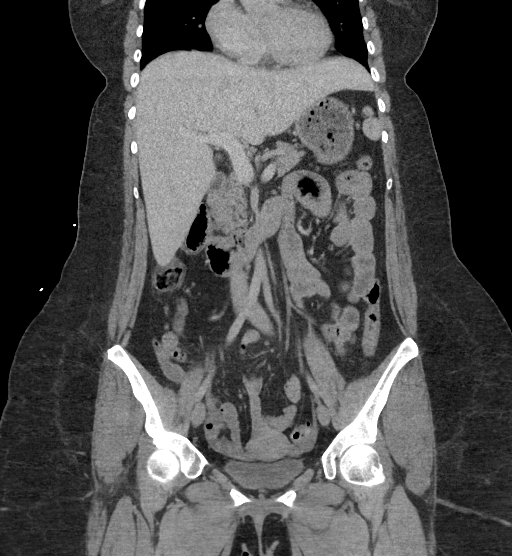
[im 77/138  soft-tissue]
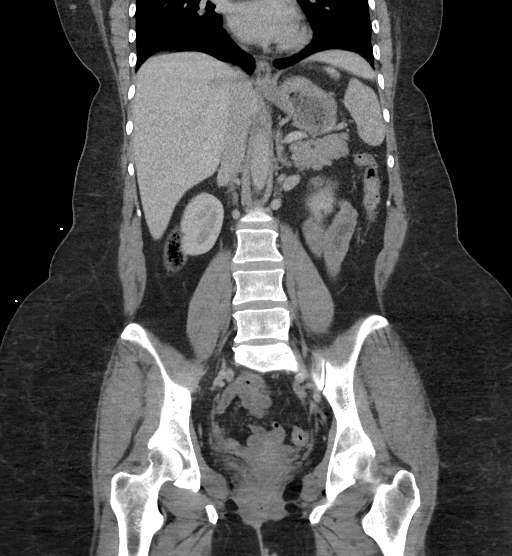

[16 of 46 positions shown; findings below may reference images not displayed]

RADIATION DOSE REDUCTION: This exam was performed according to the
departmental dose-optimization program which includes automated
exposure control, adjustment of the mA and/or kV according to
patient size and/or use of iterative reconstruction technique.

CONTRAST:  75mL OMNIPAQUE IOHEXOL 350 MG/ML SOLN
FINDINGS: Lower chest: Lung bases are unremarkable. Heart size is at the upper
limits of normal. No pericardial effusion.

Hepatobiliary: Smooth liver contours. No focal liver mass is
identified. The gallbladder is not visualized and may be contracted
or surgically absent. No cholecystectomy clips are visualized. No
intrahepatic or extrahepatic biliary ductal dilatation.

Pancreas: No mass or inflammatory fat stranding. No pancreatic
ductal dilatation is seen.

Spleen: Normal in size without focal abnormality.

Adrenals/Urinary Tract: Adrenal glands are unremarkable. The kidneys
enhance uniformly and are symmetric in size without hydronephrosis.
There is a 5 mm nonobstructing right renal midpole stone. There are
6 mm left upper pole 3 mm left mid to upper pole, 5 mm left midpole,
and 1 mm left lower pole (axial image 39) nonobstructing left renal
stones. No ureteral stone is seen. No renal mass is seen. The
urinary bladder is underdistended but otherwise grossly
unremarkable.

Stomach/Bowel: Mild sigmoid diverticulosis without inflammatory
changes to indicate acute diverticulitis. A diverticula is seen at
the descending colon/transverse colon splenic junction. The terminal
ileum is unremarkable. Normal caliber of the appendix extending
superiorly from the posterolateral aspect of the cecum (axial images
62 through 66 coronal images 58 through 71. No dilated loops of
bowel to indicate bowel obstruction.

Vascular/Lymphatic: No abdominal aortic aneurysm. No mesenteric,
retroperitoneal, or pelvic lymphadenopathy.

Reproductive: The uterus is present and unremarkable. No adnexal
mass is seen.

Other: No abdominal wall hernia or abnormality.No abdominopelvic
ascites. No pneumoperitoneum.

Musculoskeletal: No acute or significant osseous findings.
IMPRESSION: :
IMPRESSION: 1. Normal caliber of the appendix without inflammatory changes to
indicate acute appendicitis.
2. Nonobstructing bilateral renal stones measuring up to 5 mm on the
right and 6 mm on the left.
3. Mild sigmoid diverticulosis without inflammatory changes to
indicate acute diverticulitis.

## 2024-09-30 DIAGNOSIS — D72829 Elevated white blood cell count, unspecified: Secondary | ICD-10-CM | POA: Insufficient documentation

## 2024-09-30 DIAGNOSIS — R1031 Right lower quadrant pain: Secondary | ICD-10-CM | POA: Insufficient documentation

## 2024-10-01 ENCOUNTER — Emergency Department (HOSPITAL_COMMUNITY)

## 2024-10-01 ENCOUNTER — Encounter (HOSPITAL_COMMUNITY): Payer: Self-pay | Admitting: *Deleted

## 2024-10-01 ENCOUNTER — Other Ambulatory Visit: Payer: Self-pay

## 2024-10-01 ENCOUNTER — Emergency Department (HOSPITAL_COMMUNITY)
Admission: EM | Admit: 2024-10-01 | Discharge: 2024-10-01 | Disposition: A | Attending: Emergency Medicine | Admitting: Emergency Medicine

## 2024-10-01 DIAGNOSIS — R1031 Right lower quadrant pain: Secondary | ICD-10-CM

## 2024-10-01 LAB — URINALYSIS, ROUTINE W REFLEX MICROSCOPIC
Bacteria, UA: NONE SEEN
Bilirubin Urine: NEGATIVE
Glucose, UA: NEGATIVE mg/dL
Hgb urine dipstick: NEGATIVE
Ketones, ur: NEGATIVE mg/dL
Nitrite: NEGATIVE
Protein, ur: 100 mg/dL — AB
Specific Gravity, Urine: 1.027 (ref 1.005–1.030)
pH: 5 (ref 5.0–8.0)

## 2024-10-01 LAB — COMPREHENSIVE METABOLIC PANEL WITH GFR
ALT: 17 U/L (ref 0–44)
AST: 18 U/L (ref 15–41)
Albumin: 4.2 g/dL (ref 3.5–5.0)
Alkaline Phosphatase: 102 U/L (ref 38–126)
Anion gap: 12 (ref 5–15)
BUN: 13 mg/dL (ref 6–20)
CO2: 26 mmol/L (ref 22–32)
Calcium: 9.3 mg/dL (ref 8.9–10.3)
Chloride: 101 mmol/L (ref 98–111)
Creatinine, Ser: 0.76 mg/dL (ref 0.44–1.00)
GFR, Estimated: >60 mL/min
Glucose, Bld: 145 mg/dL — ABNORMAL HIGH (ref 70–99)
Potassium: 3.6 mmol/L (ref 3.5–5.1)
Sodium: 139 mmol/L (ref 135–145)
Total Bilirubin: 0.6 mg/dL (ref 0.0–1.2)
Total Protein: 7.8 g/dL (ref 6.5–8.1)

## 2024-10-01 LAB — CBC
HCT: 39.6 % (ref 36.0–46.0)
Hemoglobin: 13 g/dL (ref 12.0–15.0)
MCH: 29.6 pg (ref 26.0–34.0)
MCHC: 32.8 g/dL (ref 30.0–36.0)
MCV: 90.2 fL (ref 80.0–100.0)
Platelets: 383 K/uL (ref 150–400)
RBC: 4.39 MIL/uL (ref 3.87–5.11)
RDW: 12.5 % (ref 11.5–15.5)
WBC: 13.1 K/uL — ABNORMAL HIGH (ref 4.0–10.5)
nRBC: 0 % (ref 0.0–0.2)

## 2024-10-01 LAB — HCG, SERUM, QUALITATIVE: Preg, Serum: NEGATIVE

## 2024-10-01 LAB — LIPASE, BLOOD: Lipase: 25 U/L (ref 11–51)

## 2024-10-01 MED ORDER — OXYCODONE-ACETAMINOPHEN 5-325 MG PO TABS: 1.0000 | ORAL_TABLET | Freq: Three times a day (TID) | ORAL | 0 refills | Status: DC | PRN

## 2024-10-01 MED ORDER — HYDROMORPHONE HCL 1 MG/ML IJ SOLN
1.0000 mg | Freq: Once | INTRAMUSCULAR | Status: AC
  Administered 2024-10-01: 1 mg via INTRAVENOUS
  Filled 2024-10-01: qty 1

## 2024-10-01 MED ORDER — DICYCLOMINE HCL 20 MG PO TABS: 20.0000 mg | ORAL_TABLET | Freq: Two times a day (BID) | ORAL | 0 refills | Status: DC

## 2024-10-01 MED ORDER — LACTATED RINGERS IV BOLUS
1000.0000 mL | Freq: Once | INTRAVENOUS | Status: AC
  Administered 2024-10-01: 1000 mL via INTRAVENOUS

## 2024-10-01 MED ORDER — IBUPROFEN 400 MG PO TABS: 400.0000 mg | ORAL_TABLET | Freq: Four times a day (QID) | ORAL | 0 refills | Status: DC

## 2024-10-01 MED ORDER — IOHEXOL 350 MG/ML SOLN
75.0000 mL | Freq: Once | INTRAVENOUS | Status: AC | PRN
  Administered 2024-10-01: 75 mL via INTRAVENOUS

## 2024-10-01 MED ORDER — ONDANSETRON HCL 4 MG/2ML IJ SOLN
4.0000 mg | Freq: Once | INTRAMUSCULAR | Status: AC
  Administered 2024-10-01: 4 mg via INTRAVENOUS
  Filled 2024-10-01: qty 2

## 2024-10-01 NOTE — ED Triage Notes (Signed)
 The pt is c/o rt groin pain for 3 days no nv or diarrhea no temp  lmp 2 days

## 2024-10-01 NOTE — ED Provider Notes (Signed)
 "  EMERGENCY DEPARTMENT AT [facility] Provider Note   CSN: [id number] Arrival date & time: 09/30/24  2359     Patient presents with: Abdominal Pain   Felicia Sutton is a 44 y.o. female.   44 year old female without significant past medical history who presents to the ER today secondary to right lower quadrant abdominal pain.  Patient states has been going on for couple days has been associated nonbloody nonbilious emesis.  No diarrhea or constipation.  No known fevers.  Last menstrual cycle ended a couple days ago.  No vaginal symptoms.  No rashes.  No trauma.  No history of kidney stones.     Abdominal Pain      Prior to Admission medications  Medication Sig Start Date End Date Taking? Authorizing Provider  dicyclomine  (BENTYL ) 20 MG tablet Take 1 tablet (20 mg total) by mouth 2 (two) times daily. 10/01/24  Yes Janeshia Ciliberto, Selinda, MD  ibuprofen  (ADVIL ) 400 MG tablet Take 1 tablet (400 mg total) by mouth 4 (four) times daily for 5 days. 10/01/24 10/06/24 Yes Kule Gascoigne, Selinda, MD  oxyCODONE -acetaminophen  (PERCOCET) 5-325 MG tablet Take 1 tablet by mouth every 8 (eight) hours as needed for severe pain (pain score 7-10). 10/01/24  Yes Larosa Rhines, Selinda, MD  busPIRone  (BUSPAR ) 10 MG tablet Take 1 tablet (10 mg total) by mouth 2 (two) times daily. 12/08/21   Massengill, Rankin, MD  FLUoxetine  (PROZAC ) 10 MG capsule Take 3 capsules (30 mg total) by mouth daily. 12/09/21   Massengill, Rankin, MD  hydrOXYzine  (ATARAX ) 25 MG tablet Take 1 tablet (25 mg total) by mouth 3 (three) times daily as needed for anxiety. 12/08/21   Massengill, Rankin, MD  ketorolac  (TORADOL ) 10 MG tablet Take 1 tablet (10 mg total) by mouth 3 (three) times daily as needed. 01/07/22   Arloa Suzen RAMAN, NP  mirtazapine  (REMERON ) 7.5 MG tablet Take 1 tablet (7.5 mg total) by mouth at bedtime. 12/08/21   Massengill, Rankin, MD  ondansetron  (ZOFRAN ) 4 MG tablet Take 1 tablet (4 mg total) by mouth every 8 (eight) hours as  needed for nausea or vomiting. 01/02/22   Vivienne Delon HERO, PA-C  traZODone  (DESYREL ) 100 MG tablet Take 1 tablet (100 mg total) by mouth at bedtime. 12/08/21   Massengill, Rankin, MD  famotidine  (PEPCID ) 20 MG tablet Take 1 tablet (20 mg total) by mouth 2 (two) times daily. Patient not taking: Reported on 01/10/2020 01/02/20 01/31/20  Griselda Norris, MD    Allergies: Patient has no known allergies.    Review of Systems  Gastrointestinal:  Positive for abdominal pain.    Updated Vital Signs BP 113/71   Pulse 76   Temp 97.9 F (36.6 C) (Oral)   Resp 15   Ht 5' 2 (1.575 m)   Wt 79.4 kg   LMP 10/01/2024   SpO2 100%   BMI 32.02 kg/m   Physical Exam Vitals and nursing note reviewed.  Constitutional:      Appearance: She is well-developed.  HENT:     Head: Normocephalic and atraumatic.  Cardiovascular:     Rate and Rhythm: Normal rate and regular rhythm.  Pulmonary:     Effort: No respiratory distress.     Breath sounds: No stridor.  Abdominal:     General: There is no distension.     Tenderness: There is abdominal tenderness in the right lower quadrant. There is no guarding or rebound. Positive signs include Rovsing's sign.  Musculoskeletal:     Cervical back:  Normal range of motion.  Neurological:     Mental Status: She is alert.     (all labs ordered are listed, but only abnormal results are displayed) Labs Reviewed  COMPREHENSIVE METABOLIC PANEL WITH GFR - Abnormal; Notable for the following components:      Result Value   Glucose, Bld 145 (*)    All other components within normal limits  CBC - Abnormal; Notable for the following components:   WBC 13.1 (*)    All other components within normal limits  URINALYSIS, ROUTINE W REFLEX MICROSCOPIC - Abnormal; Notable for the following components:   Color, Urine AMBER (*)    APPearance HAZY (*)    Protein, ur 100 (*)    Leukocytes,Ua TRACE (*)    All other components within normal limits  LIPASE, BLOOD  HCG,  SERUM, QUALITATIVE    EKG: None  Radiology: US  PELVIC COMPLETE WITH TRANSVAGINAL Result Date: 10/01/2024 EXAM: US  Pelvis, Complete Transvaginal and Transabdominal without Doppler TECHNIQUE: Transabdominal and transvaginal pelvic duplex ultrasound using B-mode/gray scaled imaging without Doppler spectral analysis and color flow was obtained. COMPARISON: CT abdomen and pelvis earlier today , and pelvis ultrasound 01/01/2022. CLINICAL HISTORY: 44 year old female  with pain. FINDINGS: UTERUS: Uterus measures 6.2 x 2.8 x 3.6 cm. Estimated volume is 33 mL. Uterus demonstrates normal myometrial echotexture. ENDOMETRIAL STRIPE: Endometrium measures 0.4 cm. Endometrial stripe is within normal limits. RIGHT OVARY: Neither ovary could be identified despite both transabdominal and transvaginal attempts. LEFT OVARY: Neither ovary could be identified despite both transabdominal and transvaginal attempts. Also, ovaries were not visible on prior Ultrasound. The bilateral adnexa are diminutive and unremarkable by CT this morning (such as on the left, series 3, image 76 of that exam). FREE FLUID: No free fluid. IMPRESSION: 1. Negative uterus. Neither ovary could be identified despite transabdominal and transvaginal attempts. Electronically signed by: Helayne Hurst MD MD 10/01/2024 05:32 AM EST RP Workstation: [id number]   CT ABDOMEN PELVIS W CONTRAST Result Date: 10/01/2024 EXAM: CT ABDOMEN AND PELVIS WITH CONTRAST 10/01/2024 02:03:00 AM TECHNIQUE: CT of the abdomen and pelvis was performed with the administration of 75 mL of iohexol  (OMNIPAQUE ) 350 MG/ML injection. Multiplanar reformatted images are provided for review. Automated exposure control, iterative reconstruction, and/or weight-based adjustment of the mA/kV was utilized to reduce the radiation dose to as low as reasonably achievable. COMPARISON: 12/30/2021 CLINICAL HISTORY: RLQ abdominal pain. FINDINGS: LOWER CHEST: No acute abnormality. LIVER: The liver is  unremarkable. GALLBLADDER AND BILE DUCTS: Status post cholecystectomy. No biliary ductal dilatation. SPLEEN: No acute abnormality. PANCREAS: No acute abnormality. ADRENAL GLANDS: No acute abnormality. KIDNEYS, URETERS AND BLADDER: No stones in the kidneys or ureters. No hydronephrosis. No perinephric or periureteral stranding. Urinary bladder is unremarkable. GI AND BOWEL: Stomach demonstrates no acute abnormality. There is no bowel obstruction. Normal appendix (image 58). PERITONEUM AND RETROPERITONEUM: No ascites. No free air. VASCULATURE: Aorta is normal in caliber. LYMPH NODES: No lymphadenopathy. REPRODUCTIVE ORGANS: Uterus is within normal limits. BONES AND SOFT TISSUES: No acute osseous abnormality. No focal soft tissue abnormality. IMPRESSION: 1. No acute findings in the abdomen or pelvis. 2. Normal appendix. Electronically signed by: Pinkie Pebbles MD MD 10/01/2024 02:14 AM EST RP Workstation: [id number]     Procedures   Medications Ordered in the ED  HYDROmorphone  (DILAUDID ) injection 1 mg (1 mg Intravenous Given 10/01/24 0228)  ondansetron  (ZOFRAN ) injection 4 mg (4 mg Intravenous Given 10/01/24 0229)  lactated ringers  bolus 1,000 mL (0 mLs Intravenous Stopped 10/01/24 0307)  iohexol  (OMNIPAQUE ) 350 MG/ML injection 75 mL (75 mLs Intravenous Contrast Given 10/01/24 0210)  HYDROmorphone  (DILAUDID ) injection 1 mg (1 mg Intravenous Given 10/01/24 0457)                                    Medical Decision Making Amount and/or Complexity of Data Reviewed Radiology: ordered.  Risk Prescription drug management.  Leukocytosis, tachycardia, right lower quadrant pain with emesis concern for possible appendicitis with a CT scan.  Does not really seem to be in her groin but also consider kidney stone, less likely pyelonephritis.  Also could be muscular.  Also consider possible cyst/ruptured cyst less likely ovarian torsion but still may do ultrasound if CT is not revealing. CT negative, US   negative. Unsure on cause of symptoms. Maybe MSK. Maybe functional. Will fu close with PCP if not improving. Return here for new/worsening symptoms.    Final diagnoses:  Right lower quadrant abdominal pain    ED Discharge Orders          Ordered    dicyclomine  (BENTYL ) 20 MG tablet  2 times daily        10/01/24 0610    oxyCODONE -acetaminophen  (PERCOCET) 5-325 MG tablet  Every 8 hours PRN        10/01/24 0610    ibuprofen  (ADVIL ) 400 MG tablet  4 times daily        10/01/24 0610               Areli Jowett, Selinda, MD 10/01/24 [phone]  "

## 2024-10-01 NOTE — ED Notes (Signed)
 Questions and concerns addressed. Discharge teaching completed.   Prescriptions reviewed and pharmacy verified.   Pt ambulatory upon discharge.

## 2024-10-04 ENCOUNTER — Emergency Department (EMERGENCY_DEPARTMENT_HOSPITAL)
Admission: EM | Admit: 2024-10-04 | Discharge: 2024-10-04 | Disposition: A | Discharge status: Other Acute Inpatient Hospital | Source: Home / Self Care | Attending: Emergency Medicine | Admitting: Emergency Medicine

## 2024-10-04 ENCOUNTER — Emergency Department (HOSPITAL_COMMUNITY)

## 2024-10-04 ENCOUNTER — Other Ambulatory Visit: Payer: Self-pay

## 2024-10-04 ENCOUNTER — Encounter (HOSPITAL_COMMUNITY): Payer: Self-pay | Admitting: Emergency Medicine

## 2024-10-04 ENCOUNTER — Inpatient Hospital Stay (HOSPITAL_COMMUNITY)
Admission: AD | Admit: 2024-10-04 | Discharge: 2024-10-13 | DRG: 885 | Disposition: A | Discharge status: Home / Self Care | Source: Intra-hospital

## 2024-10-04 ENCOUNTER — Encounter (HOSPITAL_COMMUNITY): Payer: Self-pay | Admitting: Psychiatry

## 2024-10-04 DIAGNOSIS — F4321 Adjustment disorder with depressed mood: Secondary | ICD-10-CM | POA: Insufficient documentation

## 2024-10-04 DIAGNOSIS — F333 Major depressive disorder, recurrent, severe with psychotic symptoms: Principal | ICD-10-CM | POA: Diagnosis present

## 2024-10-04 DIAGNOSIS — R1031 Right lower quadrant pain: Secondary | ICD-10-CM | POA: Insufficient documentation

## 2024-10-04 DIAGNOSIS — D649 Anemia, unspecified: Secondary | ICD-10-CM | POA: Insufficient documentation

## 2024-10-04 DIAGNOSIS — Z634 Disappearance and death of family member: Secondary | ICD-10-CM

## 2024-10-04 DIAGNOSIS — F332 Major depressive disorder, recurrent severe without psychotic features: Secondary | ICD-10-CM | POA: Insufficient documentation

## 2024-10-04 DIAGNOSIS — R45851 Suicidal ideations: Secondary | ICD-10-CM | POA: Insufficient documentation

## 2024-10-04 LAB — COMPREHENSIVE METABOLIC PANEL WITH GFR
ALT: 14 U/L (ref 0–44)
AST: 15 U/L (ref 15–41)
Albumin: 4 g/dL (ref 3.5–5.0)
Alkaline Phosphatase: 88 U/L (ref 38–126)
Anion gap: 12 (ref 5–15)
BUN: 12 mg/dL (ref 6–20)
CO2: 23 mmol/L (ref 22–32)
Calcium: 9 mg/dL (ref 8.9–10.3)
Chloride: 105 mmol/L (ref 98–111)
Creatinine, Ser: 0.63 mg/dL (ref 0.44–1.00)
GFR, Estimated: >60 mL/min
Glucose, Bld: 147 mg/dL — ABNORMAL HIGH (ref 70–99)
Potassium: 3.5 mmol/L (ref 3.5–5.1)
Sodium: 141 mmol/L (ref 135–145)
Total Bilirubin: 0.5 mg/dL (ref 0.0–1.2)
Total Protein: 7 g/dL (ref 6.5–8.1)

## 2024-10-04 LAB — CBC WITH DIFFERENTIAL/PLATELET
Abs Immature Granulocytes: 0.03 K/uL (ref 0.00–0.07)
Basophils Absolute: 0 K/uL (ref 0.0–0.1)
Basophils Relative: 1 %
Eosinophils Absolute: 0.1 K/uL (ref 0.0–0.5)
Eosinophils Relative: 1 %
HCT: 35.9 % — ABNORMAL LOW (ref 36.0–46.0)
Hemoglobin: 11.9 g/dL — ABNORMAL LOW (ref 12.0–15.0)
Immature Granulocytes: 0 %
Lymphocytes Relative: 37 %
Lymphs Abs: 3.1 K/uL (ref 0.7–4.0)
MCH: 29.9 pg (ref 26.0–34.0)
MCHC: 33.1 g/dL (ref 30.0–36.0)
MCV: 90.2 fL (ref 80.0–100.0)
Monocytes Absolute: 0.5 K/uL (ref 0.1–1.0)
Monocytes Relative: 6 %
Neutro Abs: 4.6 K/uL (ref 1.7–7.7)
Neutrophils Relative %: 55 %
Platelets: 266 K/uL (ref 150–400)
RBC: 3.98 MIL/uL (ref 3.87–5.11)
RDW: 12.5 % (ref 11.5–15.5)
WBC: 8.3 K/uL (ref 4.0–10.5)
nRBC: 0 % (ref 0.0–0.2)

## 2024-10-04 LAB — URINALYSIS, ROUTINE W REFLEX MICROSCOPIC
Bacteria, UA: NONE SEEN
Bilirubin Urine: NEGATIVE
Glucose, UA: NEGATIVE mg/dL
Hgb urine dipstick: NEGATIVE
Ketones, ur: NEGATIVE mg/dL
Nitrite: NEGATIVE
Protein, ur: 30 mg/dL — AB
Specific Gravity, Urine: 1.029 (ref 1.005–1.030)
pH: 5 (ref 5.0–8.0)

## 2024-10-04 LAB — LIPASE, BLOOD: Lipase: 25 U/L (ref 11–51)

## 2024-10-04 LAB — HCG, SERUM, QUALITATIVE: Preg, Serum: NEGATIVE

## 2024-10-04 LAB — ACETAMINOPHEN LEVEL: Acetaminophen (Tylenol), Serum: <10 ug/mL — ABNORMAL LOW (ref 10–30)

## 2024-10-04 MED ORDER — ONDANSETRON 4 MG PO TBDP: 4.0000 mg | ORAL_TABLET | Freq: Three times a day (TID) | ORAL | Status: DC | PRN

## 2024-10-04 MED ORDER — HYDROXYZINE HCL 25 MG PO TABS
25.0000 mg | ORAL_TABLET | Freq: Three times a day (TID) | ORAL | Status: DC | PRN
  Administered 2024-10-04 – 2024-10-12 (×14): 25 mg via ORAL
  Filled 2024-10-04 (×9): qty 1
  Filled 2024-10-04: qty 10
  Filled 2024-10-04 (×5): qty 1

## 2024-10-04 MED ORDER — ACETAMINOPHEN 325 MG PO TABS
650.0000 mg | ORAL_TABLET | Freq: Four times a day (QID) | ORAL | Status: DC | PRN
  Administered 2024-10-05: 650 mg via ORAL
  Filled 2024-10-04: qty 2

## 2024-10-04 MED ORDER — DIPHENHYDRAMINE HCL 50 MG/ML IJ SOLN: 50.0000 mg | Freq: Three times a day (TID) | INTRAMUSCULAR | Status: DC | PRN

## 2024-10-04 MED ORDER — HALOPERIDOL 5 MG PO TABS
5.0000 mg | ORAL_TABLET | Freq: Three times a day (TID) | ORAL | Status: DC | PRN
  Administered 2024-10-08: 5 mg via ORAL
  Filled 2024-10-04: qty 1

## 2024-10-04 MED ORDER — ONDANSETRON 4 MG PO TBDP
4.0000 mg | ORAL_TABLET | Freq: Once | ORAL | Status: AC
  Administered 2024-10-04: 4 mg via ORAL
  Filled 2024-10-04: qty 1

## 2024-10-04 MED ORDER — HALOPERIDOL LACTATE 5 MG/ML IJ SOLN: 5.0000 mg | Freq: Three times a day (TID) | INTRAMUSCULAR | Status: DC | PRN

## 2024-10-04 MED ORDER — TRAZODONE HCL 50 MG PO TABS
50.0000 mg | ORAL_TABLET | Freq: Every evening | ORAL | Status: DC | PRN
  Administered 2024-10-04 – 2024-10-06 (×3): 50 mg via ORAL
  Filled 2024-10-04 (×3): qty 1

## 2024-10-04 MED ORDER — DIPHENHYDRAMINE HCL 25 MG PO CAPS
50.0000 mg | ORAL_CAPSULE | Freq: Three times a day (TID) | ORAL | Status: DC | PRN
  Administered 2024-10-08: 50 mg via ORAL
  Filled 2024-10-04: qty 2

## 2024-10-04 MED ORDER — LORAZEPAM 2 MG/ML IJ SOLN: 2.0000 mg | Freq: Three times a day (TID) | INTRAMUSCULAR | Status: DC | PRN

## 2024-10-04 MED ORDER — KETOROLAC TROMETHAMINE 30 MG/ML IJ SOLN
30.0000 mg | Freq: Once | INTRAMUSCULAR | Status: AC
  Administered 2024-10-04: 30 mg via INTRAMUSCULAR
  Filled 2024-10-04: qty 1

## 2024-10-04 MED ORDER — HALOPERIDOL LACTATE 5 MG/ML IJ SOLN: 10.0000 mg | Freq: Three times a day (TID) | INTRAMUSCULAR | Status: DC | PRN

## 2024-10-04 MED ORDER — MAGNESIUM HYDROXIDE 400 MG/5ML PO SUSP: 30.0000 mL | Freq: Every day | ORAL | Status: DC | PRN

## 2024-10-04 MED ORDER — ALUM & MAG HYDROXIDE-SIMETH 200-200-20 MG/5ML PO SUSP: 30.0000 mL | ORAL | Status: DC | PRN

## 2024-10-04 NOTE — ED Provider Notes (Signed)
 Initially patient was hesitant to sign voluntary form to be admitted however I informed her that she would be under IVC if she does not want to go voluntarily due to what psychiatry recommended and now she will go voluntarily.   Xoey Warmoth, Lonni PARAS, MD 10/04/24 [phone]

## 2024-10-04 NOTE — Progress Notes (Signed)
 Pt has been accepted to [facility] on 10/04/2024 Bed assignment: 300-01  Pt meets inpatient criteria per: Elveria Batter NP  Attending Physician will be: Dr. Raliegh MD  Report can be called to: Adult unit: [phone]  Pt can arrive after UDS and EKG.   Care Team Notified: [facility] Danika Carlo RN, Elveria Batter NP  [place] Chrishana Spargur LCSW  10/04/2024 11:21 AM

## 2024-10-04 NOTE — [facility] Group Notes (Signed)
 [facility] Group Notes:  (Nursing/MHT/Case Management/Adjunct)  Date:  10/04/2024  Time:  9:19 PM  Type of Therapy:  NA Group  Participation Level:  Active  Participation Quality:  Appropriate  Affect:  Appropriate  Cognitive:  Appropriate  Insight:  Appropriate  Engagement in Group:  Engaged  Modes of Intervention:  Education  Summary of Progress/Problems:Attended NA Group.  Felicia Sutton 10/04/2024, 9:19 PM

## 2024-10-04 NOTE — ED Notes (Signed)
 Called staffing no sitters available

## 2024-10-04 NOTE — ED Notes (Signed)
 Called safe transport, was told to call back

## 2024-10-04 NOTE — Consult Note (Cosign Needed)
 [facility] Psychiatric Consult Initial  Patient Name: .Felicia Sutton  MRN: [id number]  DOB: [birth year]  Consult Order details:  Orders (From admission, onward)     Start     Ordered   10/04/24 0749  CONSULT TO CALL ACT TEAM       Ordering Provider: Dreama Longs, MD  Provider:  (Not yet assigned)  Question:  Reason for Consult?  Answer:  SI//voices   10/04/24 0749             Mode of Visit: In person    Psychiatry Consult Evaluation  Service Date: October 04, 2024 LOS:  LOS: 0 days  Chief Complaint presented initially for abdominal pain and groin pain but then reported she has started to hear voices.  She reports that she lost her son and has been feeling depressed, and is now hearing voices.  She is having suicidal thoughts.  Reports that if she were to leave here she thinks she would overdose on pills  Primary Psychiatric Diagnoses  Major depressive disorder recurrent, severe 2.  Grief with loss of child 3. Suicidal ideations    Assessment  Felicia Sutton is a 44 y.o. female admitted: Presented to the EDfor 10/04/2024  2:29 AM for presented initially for abdominal pain and groin pain but then reported she has started to hear voices.  She reports that she lost her son and has been feeling depressed, and is now hearing voices.  She is having suicidal thoughts.  Reports that if she were to leave here she thinks she would overdose on pills. She carries the psychiatric diagnoses of  MDD, GAD substance abuse (THC, cocaine, and past EtOH/IV heroin) and 1 prior suicide attempt via OD and has a past medical history of  GERD.  Her current presentation of helplessness, hopelessness, worthlessness, guilt, decreased appetite and sleep, no motivation/energy along with suicidal ideations and addition to grief is most consistent with MDD. She meets criteria for psychiatric admission based on current symptomology.   On initial examination, patient is laying in her bed asleep.  She is  easily awakened and does not appear to be in any acute distress.  She is calm and cooperative.  She is withdrawn and answer questions minimally.  Patient reports that she came to the hospital initially because she was having some right sided abdominal/groin pain.  When the nurse asked suicide screening questions she states she did answer yes.  Per chart review she also told the emergency room physician that if she were to be discharged home today she would probably overdose on medications.   Patient reports a long history of depression and has taken medications in the past and felt they were helpful.  She cannot remember the last time she has taken any medications.  2 weeks ago she lost her 56 year old son and a motor vehicle accident.  He was hit by a drunk driver.  She has noticed a severe increase in her depression since that time.  She feels helpless, hopeless, worthless, guilt, no motivation, no energy, decreased appetite and sleep.  She has also had passive suicidal thoughts.  She denies any specific plan or intent with this clinical research associate.   She denies auditory and visual hallucinations.  She hears her own voice and does have inner negative dialogue.  She denies any paranoia or delusional thought content.  She does not appear to be responding to internal/external stimuli.  Discussed psychiatric admission with patient and she is in agreement.  She gave permission to contact  her ex-husband whom she lives with Medford Sauers at [phone].  However that number is not in service.  Please see plan below for detailed recommendations.   Diagnoses:  Active Hospital problems: Principal Problem:   Grief at loss of child Active Problems:   MDD (major depressive disorder), recurrent episode, severe (HCC)   Suicidal ideation    Plan   ## Psychiatric Medication Recommendations:  -none at this time defer to [facility] unit   ## Medical Decision Making Capacity: Not specifically addressed in this encounter  ##  Further Work-up:  -- UDS -EKG  -- Pertinent labwork reviewed earlier this admission includes: UA, acetaminophen  level, pregnancy, lipase, CMP, CBC   ## Disposition:-- We recommend inpatient psychiatric hospitalization when medically cleared. Patient is under voluntary admission status at this time; please IVC if attempts to leave hospital.  ## Behavioral / Environmental: -To minimize splitting of staff, assign one staff person to communicate all information from the team when feasible. or Utilize compassion and acknowledge the patient's experiences while setting clear and realistic expectations for care.    ## Safety and Observation Level:  - Based on my clinical evaluation, I estimate the patient to be at high risk of self harm in the current setting. - At this time, we recommend  1:1 Observation. This decision is based on my review of the chart including patient's history and current presentation, interview of the patient, mental status examination, and consideration of suicide risk including evaluating suicidal ideation, plan, intent, suicidal or self-harm behaviors, risk factors, and protective factors. This judgment is based on our ability to directly address suicide risk, implement suicide prevention strategies, and develop a safety plan while the patient is in the clinical setting. Please contact our team if there is a concern that risk level has changed.  CSSR Risk Category:C-SSRS RISK CATEGORY: High Risk  Suicide Risk Assessment: Patient has following modifiable risk factors for suicide: untreated depression, medication noncompliance, active mental illness (to encompass adhd, tbi, mania, psychosis, trauma reaction), current symptoms: anxiety/panic, insomnia, impulsivity, anhedonia, hopelessness, triggering events, recent psychiatric hospitalization, and recent loss (death, isolation, vocation), which we are addressing by recommending psychiatric admission. Patient has following  non-modifiable or demographic risk factors for suicide: history of suicide attempt and psychiatric hospitalization Patient has the following protective factors against suicide: Access to outpatient mental health care, Supportive friends, and Cultural, spiritual, or religious beliefs that discourage suicide  Thank you for this consult request. Recommendations have been communicated to the primary team.  We will continue to follow while awaiting psychiatric bed placement at this time.   Elveria VEAR Batter, NP       History of Present Illness  Relevant Aspects of Hospital ED Course:  Admitted on 10/04/2024 2:29 AM for presented initially for abdominal pain and groin pain but then reported she has started to hear voices.  She reports that she lost her son and has been feeling depressed, and is now hearing voices.  She is having suicidal thoughts.  Reports that if she were to leave here she thinks she would overdose on pills. She carries the psychiatric diagnoses of  MDD, GAD substance abuse (THC, cocaine, and past EtOH/IV heroin) and 1 prior suicide attempt via OD and has a past medical history of  GERD  Patient Report:  I came in because I was having ovary pain and they asked me if I had felt suicidal and I said yes  Dr. Caron, She was seen in the emergency department 3 days ago  with concern for right sided abdominal pain.  She had a CT scan which showed no evidence of appendicitis or other acute abnormalities.  She had an ultrasound which did not show evidence of abnormalities, but also there was nonvisualization of ovaries.  Her right sided abdominal pain has continued.  Reports it as right sided groin appearing.  She has had some associated nausea and vomiting over the last couple of days.  Denies any fever, vaginal discharge, urinary symptoms, diarrhea or constipation.   She also reports she has started to hear voices.  She reports that she lost her son and has been feeling depressed, and is  now hearing voices.  She is having suicidal thoughts.  Reports that if she were to leave here she thinks she would overdose on pills  Psych ROS:  Depression: Endorses Anxiety: Endorses Mania (lifetime and current): Denies Psychosis: (lifetime and current): As  Collateral information:  Attempted to contact the ex-husband whom she lives with Medford Sauers [phone] unsuccessful  Review of Systems  Constitutional:  Negative for fever.  Respiratory:  Negative for cough.   Neurological:  Negative for tremors.  Psychiatric/Behavioral:  Positive for depression and suicidal ideas. The patient is nervous/anxious.      Psychiatric and Social History  Psychiatric History:  Information collected from chart review and patient  Prev Dx/Sx: MDD, substance abuse (THC, cocaine, and past EtOH/IV heroin) and 1 prior suicide attempt via OD Current Psych Provider: None currently Home Meds (current): None currently Previous Med Trials: Per chart Zoloft, Lexapro, Remeron , Effexor, Abilify , and trazodone  Therapy: None currently  Prior Psych Hospitalization: Yes unsure of how many times she has been admitted Prior Self Harm: History of 1 suicide attempt Prior Violence: denies  Family Psych History: No known Family Hx suicide: No known  Social History:  Developmental Hx: Yes Educational Hx: High school Occupational Hx: Unemployed Legal Hx: Denies Living Situation: Lives with ex-husband Spiritual Hx: Christian Access to weapons/lethal means: Denies  Substance History Alcohol: Denies any current use Tobacco: Denies any current use Illicit drugs: Denies Prescription drug abuse: Denies Rehab hx: Denies  Exam Findings  Physical Exam:  Vital Signs:  Temp:  [97.5 F (36.4 C)-98.2 F (36.8 C)] 98.2 F (36.8 C) (01/14 0641) Pulse Rate:  [76-84] 76 (01/14 0641) Resp:  [16-18] 18 (01/14 0641) BP: (119-124)/(59-84) 123/84 (01/14 0641) SpO2:  [100 %] 100 % (01/14 0920) Weight:  [79.4 kg]  79.4 kg (01/14 0754) Blood pressure 123/84, pulse 76, temperature 98.2 F (36.8 C), resp. rate 18, height 5' 2 (1.575 m), weight 79.4 kg, last menstrual period 10/01/2024, SpO2 100%. Body mass index is 32.02 kg/m.  Physical Exam Pulmonary:     Effort: No respiratory distress.  Neurological:     Mental Status: She is alert and oriented to person, place, and time.  Psychiatric:        Attention and Perception: Attention and perception normal.        Mood and Affect: Mood is anxious and depressed.        Speech: Speech normal.        Behavior: Behavior is withdrawn. Behavior is cooperative.        Thought Content: Thought content includes suicidal ideation. Thought content does not include suicidal plan.        Cognition and Memory: Cognition normal.        Judgment: Judgment normal.     Mental Status Exam: General Appearance: Casual  Orientation:  Full (Time, Place, and Person)  Memory:  Immediate;   Good Recent;   Good Remote;   Good  Concentration:  Concentration: Fair and Attention Span: Fair  Recall:  Good  Attention  Fair  Eye Contact:  Good  Speech:  Clear and Coherent  Language:  Good  Volume:  Normal  Mood: sad  Affect:  Congruent  Thought Process:  Coherent  Thought Content:  Logical  Suicidal Thoughts:  Yes.  without intent/plan  Homicidal Thoughts:  No  Judgement:  Good  Insight:  Good  Psychomotor Activity:  Normal  Akathisia:  NA  Fund of Knowledge:  Good      Assets:  Communication Skills Desire for Improvement Housing Leisure Time Physical Health Resilience Social Support  Cognition:  WNL  ADL's:  Intact  AIMS (if indicated):        Other History   These have been pulled in through the EMR, reviewed, and updated if appropriate.  Family History:  The patient's family history is not on file.  Medical History: Past Medical History:  Diagnosis Date   GERD (gastroesophageal reflux disease) 2017   Major depression 12/2019    Surgical  History: Past Surgical History:  Procedure Laterality Date   CHOLECYSTECTOMY  01/2019   ESOPHAGOGASTRODUODENOSCOPY (EGD) WITH PROPOFOL  N/A 02/01/2020   Procedure: ESOPHAGOGASTRODUODENOSCOPY (EGD) WITH PROPOFOL ;  Surgeon: Legrand Victory LITTIE DOUGLAS, MD;  Location: [facility];  Service: Gastroenterology;  Laterality: N/A;     Medications:  Current Medications[1]  Allergies: Allergies[2]  Elveria VEAR Batter, NP     [1]  Current Facility-Administered Medications:    ketorolac  (TORADOL ) 30 MG/ML injection 30 mg, 30 mg, Intramuscular, Once, Dreama Longs, MD  Current Outpatient Medications:    dicyclomine  (BENTYL ) 20 MG tablet, Take 1 tablet (20 mg total) by mouth 2 (two) times daily. (Patient not taking: Reported on 10/04/2024), Disp: 20 tablet, Rfl: 0   ibuprofen  (ADVIL ) 400 MG tablet, Take 1 tablet (400 mg total) by mouth 4 (four) times daily for 5 days. (Patient not taking: Reported on 10/04/2024), Disp: 30 tablet, Rfl: 0   oxyCODONE -acetaminophen  (PERCOCET) 5-325 MG tablet, Take 1 tablet by mouth every 8 (eight) hours as needed for severe pain (pain score 7-10). (Patient not taking: Reported on 10/04/2024), Disp: 10 tablet, Rfl: 0 [2] No Known Allergies

## 2024-10-04 NOTE — ED Notes (Signed)
 Belongings given to safe transport, pt leaving in no new onset distress at this time with sitter.

## 2024-10-04 NOTE — ED Triage Notes (Signed)
 Pt states she continues to have groin pain being see here for same with no relief.

## 2024-10-04 NOTE — Progress Notes (Signed)
 Admission note: Patient is a 44 year old  caucasian female admitted voluntarily  from [facility] for suicidal ideation with no plan. According to the report received form Mabel, RN, patient initially came into the ED for complaint of abdominal pain,  However, endorsed suicidal ideation while she was being discharged. Patient arrived to the unit via safe transport at 1837, alert, awake, oriented  X's 4.  When patient was asked the reason she came to the hospital, patient states One of my family members just passed away, and I have been depressed. Patient denies HI/AVH, but endorses SI. Patient agreed to inform staff if suicidal ideation gets worse.  Pt had orientation to unit, room and routine. Information packet given to patient and safety information discussed with her.  Admission INP armband ID verified with patient, and in place, fall risk assessment completed with Patient and she verbalized understanding of risks associated with falls. No contraband found during skin assessment, Skin, clean-dry- intact without evidence of bruising, or skin tears and tracks marks. Q 15 minutes safety observation in place. Staff will continue to provide support to patient.

## 2024-10-04 NOTE — ED Notes (Signed)
 Report given to cece RN of Behavioral health unit

## 2024-10-04 NOTE — ED Notes (Signed)
 After conversation with Tegler MD pt signed voluntary [facility] paper work see documents.

## 2024-10-04 NOTE — Plan of Care (Signed)
   Problem: Education: Goal: Knowledge of [facility] Education information/materials will improve Outcome: Progressing Goal: Emotional status will improve Outcome: Progressing Goal: Mental status will improve Outcome: Progressing Goal: Verbalization of understanding the information provided will improve Outcome: Progressing

## 2024-10-04 NOTE — ED Notes (Signed)
 Pt offered voluntary consent paper work digitally for acceptance to behavioral health and refused to sign.

## 2024-10-04 NOTE — ED Notes (Signed)
 Talked to [employer], they are on the way to pick up the patient

## 2024-10-04 NOTE — ED Provider Triage Note (Signed)
 Emergency Medicine Provider Triage Evaluation Note  Felicia Sutton , a 44 y.o. female  was evaluated in triage.  Pt complains of RLQ pain with vomiting x 3 days. Drinking coke. Advised NPO until seen.  Seen in ER 10/01/24, normal appendix on CT, pelvic US  normal Review of Systems  Positive:  Negative:   Physical Exam  BP (!) 124/59 (BP Location: Right Arm)   Pulse 84   Temp (!) 97.5 F (36.4 C)   Resp 18   LMP 10/01/2024   SpO2 100%  Gen:   Awake, no distress   Resp:  Normal effort  MSK:   Moves extremities without difficulty  Other:  No pain with palpation  Medical Decision Making  Medically screening exam initiated at 2:43 AM.  Appropriate orders placed.  Felicia Sutton was informed that the remainder of the evaluation will be completed by another provider, this initial triage assessment does not replace that evaluation, and the importance of remaining in the ED until their evaluation is complete.     Beverley Leita LABOR, PA-C 10/04/24 [phone]

## 2024-10-04 NOTE — ED Notes (Signed)
 Reports Having SI thoughts due to loss of son.  Reports thoughts have been going on for awhile and if she leaves she is going to take pills to overdose and die.  Patient also reports hearing voices but will not elaborate.

## 2024-10-04 NOTE — ED Provider Notes (Addendum)
 " [place] [facility] Provider Note   CSN: [id number] Arrival date & time: 10/04/24  9771     Patient presents with: Abdominal Pain   Felicia Sutton is a 44 y.o. female.   HPI      44 year old female with a history of major depression presents with concern for right sided abdominal pain and suicidal thoughts.  She was seen in the emergency department 3 days ago with concern for right sided abdominal pain.  She had a CT scan which showed no evidence of appendicitis or other acute abnormalities.  She had an ultrasound which did not show evidence of abnormalities, but also there was nonvisualization of ovaries.  Her right sided abdominal pain has continued.  Reports it as right sided groin appearing.  She has had some associated nausea and vomiting over the last couple of days.  Denies any fever, vaginal discharge, urinary symptoms, diarrhea or constipation.  She also reports she has started to hear voices.  She reports that she lost her son and has been feeling depressed, and is now hearing voices.  She is having suicidal thoughts.  Reports that if she were to leave here she thinks she would overdose on pills.  Past Medical History:  Diagnosis Date   GERD (gastroesophageal reflux disease) 2017   Major depression 12/2019     Prior to Admission medications  Medication Sig Start Date End Date Taking? Authorizing Provider  busPIRone  (BUSPAR ) 10 MG tablet Take 1 tablet (10 mg total) by mouth 2 (two) times daily. 12/08/21   Massengill, Rankin, MD  dicyclomine  (BENTYL ) 20 MG tablet Take 1 tablet (20 mg total) by mouth 2 (two) times daily. 10/01/24   Mesner, Selinda, MD  FLUoxetine  (PROZAC ) 10 MG capsule Take 3 capsules (30 mg total) by mouth daily. 12/09/21   Massengill, Rankin, MD  hydrOXYzine  (ATARAX ) 25 MG tablet Take 1 tablet (25 mg total) by mouth 3 (three) times daily as needed for anxiety. 12/08/21   Massengill, Rankin, MD  ibuprofen  (ADVIL ) 400 MG  tablet Take 1 tablet (400 mg total) by mouth 4 (four) times daily for 5 days. 10/01/24 10/06/24  Mesner, Selinda, MD  ketorolac  (TORADOL ) 10 MG tablet Take 1 tablet (10 mg total) by mouth 3 (three) times daily as needed. 01/07/22   Arloa Suzen RAMAN, NP  mirtazapine  (REMERON ) 7.5 MG tablet Take 1 tablet (7.5 mg total) by mouth at bedtime. 12/08/21   Massengill, Rankin, MD  ondansetron  (ZOFRAN ) 4 MG tablet Take 1 tablet (4 mg total) by mouth every 8 (eight) hours as needed for nausea or vomiting. 01/02/22   Vivienne Delon HERO, PA-C  oxyCODONE -acetaminophen  (PERCOCET) 5-325 MG tablet Take 1 tablet by mouth every 8 (eight) hours as needed for severe pain (pain score 7-10). 10/01/24   Mesner, Selinda, MD  traZODone  (DESYREL ) 100 MG tablet Take 1 tablet (100 mg total) by mouth at bedtime. 12/08/21   Massengill, Rankin, MD  famotidine  (PEPCID ) 20 MG tablet Take 1 tablet (20 mg total) by mouth 2 (two) times daily. Patient not taking: Reported on 01/10/2020 01/02/20 01/31/20  Griselda Norris, MD    Allergies: Patient has no known allergies.    Review of Systems  Updated Vital Signs BP 123/84 (BP Location: Right Arm)   Pulse 76   Temp 98.2 F (36.8 C)   Resp 18   Ht 5' 2 (1.575 m)   Wt 79.4 kg   LMP 10/01/2024   SpO2 100%   BMI 32.02 kg/m  Physical Exam Vitals and nursing note reviewed.  Constitutional:      General: She is not in acute distress.    Appearance: She is well-developed. She is not diaphoretic.  HENT:     Head: Normocephalic and atraumatic.  Eyes:     Conjunctiva/sclera: Conjunctivae normal.  Cardiovascular:     Rate and Rhythm: Normal rate and regular rhythm.  Pulmonary:     Effort: Pulmonary effort is normal. No respiratory distress.  Abdominal:     General: There is no distension.     Palpations: Abdomen is soft.     Tenderness: There is abdominal tenderness in the right lower quadrant. There is no guarding.  Musculoskeletal:        General: No tenderness.     Cervical  back: Normal range of motion.  Skin:    General: Skin is warm and dry.     Findings: No erythema or rash.  Neurological:     Mental Status: She is alert and oriented to person, place, and time.     (all labs ordered are listed, but only abnormal results are displayed) Labs Reviewed  CBC WITH DIFFERENTIAL/PLATELET - Abnormal; Notable for the following components:      Result Value   Hemoglobin 11.9 (*)    HCT 35.9 (*)    All other components within normal limits  COMPREHENSIVE METABOLIC PANEL WITH GFR - Abnormal; Notable for the following components:   Glucose, Bld 147 (*)    All other components within normal limits  URINALYSIS, ROUTINE W REFLEX MICROSCOPIC - Abnormal; Notable for the following components:   APPearance CLOUDY (*)    Protein, ur 30 (*)    Leukocytes,Ua TRACE (*)    All other components within normal limits  ACETAMINOPHEN  LEVEL - Abnormal; Notable for the following components:   Acetaminophen  (Tylenol ), Serum <10 (*)    All other components within normal limits  LIPASE, BLOOD  HCG, SERUM, QUALITATIVE    EKG: None  Radiology: US  Pelvis Complete Result Date: 10/04/2024 CLINICAL DATA:  Pelvic pain EXAM: TRANSABDOMINAL AND TRANSVAGINAL ULTRASOUND OF PELVIS TECHNIQUE: Both transabdominal and transvaginal ultrasound examinations of the pelvis were performed. Transabdominal technique was performed for global imaging of the pelvis including uterus, ovaries, adnexal regions, and pelvic cul-de-sac. It was necessary to proceed with endovaginal exam following the transabdominal exam to visualize the endometrium and ovaries. COMPARISON:  Three days ago FINDINGS: Uterus Measurements: 6.7 x 3.9 x 2.8 cm = volume: 38 mL. No fibroids or other mass visualized. Endometrium Thickness: 3 mm which is within normal limits. No focal abnormality visualized. Right ovary Not visualized due to overlying bowel gas. Left ovary Not visualized due to overlying bowel gas. Other findings No abnormal  free fluid. IMPRESSION: Ovaries are not visualized due to overlying bowel gas. No definite abnormality seen in the pelvis. Electronically Signed   By: Lynwood Landy Raddle M.D.   On: 10/04/2024 08:32     Procedures   Medications Ordered in the ED  ondansetron  (ZOFRAN -ODT) disintegrating tablet 4 mg (4 mg Oral Given 10/04/24 0305)  ondansetron  (ZOFRAN -ODT) disintegrating tablet 4 mg (4 mg Oral Given 10/04/24 9162)                                    Medical Decision Making Amount and/or Complexity of Data Reviewed Labs: ordered. Radiology: ordered.  Risk Prescription drug management.   44 year old female with a history of major  depression presents with concern for right sided abdominal pain and suicidal thoughts.  She had a CT scan done a few days ago which did not show evidence of appendicitis, diverticulitis, bowel obstruction, incarcerated hernia, nephrolithiasis, or other acute abnormalities.  Labs completed and evaluated today showed no evidence of urinary tract infection, no leukocytosis, mild anemia similar to prior, no clinically significant electrolyte abnormalities, no transaminitis, no signs of pancreatitis, negative pregnancy test.  Checked Tylenol  level in the setting of suicidal ideation which is also negative. She denies discharge, fever, pain more RLQ and low suspicion for PID, low suspicion for TOA given negative CT.    Ultrasound was repeated given nonvisualization of ovaries on ultrasound a few days ago. US  shows again nonvisualization of ovaries. My suspicion is low for torsion or other acute pelvic pathology as she now appears comfortable, and is concerned regarding suicidal ideation and depressed thoughts.  Consulted TTS regarding her hearing voices, suicidal thoughts with a plan to overdose.  She is medically cleared.      Final diagnoses:  RLQ abdominal pain  Suicidal ideation    ED Discharge Orders     None          Dreama Longs, MD 10/04/24  [phone]  "

## 2024-10-05 DIAGNOSIS — F333 Major depressive disorder, recurrent, severe with psychotic symptoms: Secondary | ICD-10-CM

## 2024-10-05 MED ORDER — ARIPIPRAZOLE 2 MG PO TABS
2.0000 mg | ORAL_TABLET | Freq: Once | ORAL | Status: AC
  Administered 2024-10-05: 2 mg via ORAL
  Filled 2024-10-05: qty 1

## 2024-10-05 MED ORDER — ARIPIPRAZOLE 5 MG PO TABS
5.0000 mg | ORAL_TABLET | Freq: Every day | ORAL | Status: DC
  Administered 2024-10-06 – 2024-10-07 (×2): 5 mg via ORAL
  Filled 2024-10-05 (×2): qty 1

## 2024-10-05 NOTE — [facility] Suicide Risk Assessment (Signed)
 [facility] INPATIENT:  Family/Significant Other Suicide Prevention Education  Suicide Prevention Education:  Contact Attempts: Medford Sauers, husband, [phone],  has been identified by the patient as the family member/significant other with whom the patient will be residing, and identified as the person(s) who will aid the patient in the event of a mental health crisis.  With written consent from the patient, two attempts were made to provide suicide prevention education, prior to and/or following the patient's discharge.  We were unsuccessful in providing suicide prevention education.  A suicide education pamphlet was given to the patient to share with family/significant other.  Date and time of first attempt:10/05/2024 at 11:00 am   Roselyn GORMAN Lento 10/05/2024, 11:16 AM

## 2024-10-05 NOTE — Group Note (Signed)
 Date:  10/05/2024 Time:  2:30 PM  Group Topic/Focus: Social Work  Anger is a normal and natural emotion, but when it is not understood or managed well, it can negatively affect our mental health, relationships, and daily functioning. In this group, we explore how anger often serves as a signal that something feels unfair, threatening, or overwhelming. By learning to recognize early warning signs--such as physical tension, racing thoughts, or irritability--patients can begin to pause before reacting. Anger management skills like deep breathing, grounding techniques, healthy communication, and problem-solving help individuals express their feelings in safer and more constructive ways. Developing these skills empowers patients to gain better emotional control, reduce conflict, and improve overall well-being.    Participation Level:  Active   Felicia Sutton 10/05/2024, 2:30 PM

## 2024-10-05 NOTE — Group Note (Signed)
 Date:  10/05/2024 Time:  10:13 AM  Group Topic/Focus: Nutrient Group  For adult patients, mental health is strongly supported by adequate intake of key nutrient groups that maintain brain function and emotional regulation. Complex carbohydrates provide a steady source of energy and support serotonin production, while proteins supply essential amino acids needed for neurotransmitters such as dopamine and serotonin. Healthy fats, particularly omega-3 fatty acids, are vital for brain cell membrane integrity and cognitive function. Micronutrients including B-complex vitamins, vitamin D , magnesium , zinc, and iron play critical roles in mood regulation, stress response, and neural signaling, with deficiencies often linked to depression and anxiety. Antioxidants from fruits and vegetables help protect the brain from oxidative stress, and dietary fiber along with probiotics supports the gut-brain axis, which influences mood and behavior. Adequate hydration further supports concentration, energy levels, and emotional stability.    Participation Level:  Active   Dolores HERO Jefferey Lippmann 10/05/2024, 10:13 AM

## 2024-10-05 NOTE — Plan of Care (Signed)
   Problem: Education: Goal: Knowledge of [facility] Education information/materials will improve Outcome: Progressing Goal: Verbalization of understanding the information provided will improve Outcome: Progressing

## 2024-10-05 NOTE — Progress Notes (Signed)
(  Sleep Hours) -7.0 as of 0530 (Any PRNs that were needed, meds refused, or side effects to meds)- prn hydroxyzine  and trazodone  @ 2135 (Any disturbances and when (visitation, over night)-none (Concerns raised by the patient)- none (SI/HI/AVH)- denies all, states she feels safe here

## 2024-10-05 NOTE — Group Note (Signed)
 Date:  10/05/2024 Time:  9:47 AM  Group Topic/Focus: Goal and orientation  Goals Group:   The focus of this group is to help patients establish daily goals to achieve during treatment and discuss how the patient can incorporate goal setting into their daily lives to aide in recovery. Orientation:   The focus of this group is to educate the patient on the purpose and policies of crisis stabilization and provide a format to answer questions about their admission.  The group details unit policies and expectations of patients while admitted.    Participation Level:  Active  Participation Quality:  Appropriate  Affect:  Appropriate  Cognitive:  Alert  Insight: Appropriate  Engagement in Group:  Engaged  Modes of Intervention:  Orientation  Additional Comments:    Dolores CHRISTELLA Fredericks 10/05/2024, 9:47 AM

## 2024-10-05 NOTE — [facility] Counselor (Signed)
 Adult Comprehensive Assessment  Patient ID: Felicia Sutton, female   DOB: 1981/04/17, 44 y.o.   MRN: [id number]  Information Source: Information source: Patient  Current Stressors:  Patient states their primary concerns and needs for treatment are:: To get the help I need, I have had thoughts with a plan and hearing voices. Patient states their goals for this hospitilization and ongoing recovery are:: To stay safe and get the help I need. Educational / Learning stressors: None reported Employment / Job issues: None reported Family Relationships: None reported Surveyor, Quantity / Lack of resources (include bankruptcy): None reported Housing / Lack of housing: None reported Physical health (include injuries & life threatening diseases): None reported Social relationships: None reported Substance abuse: None reported Bereavement / Loss: None reported  Living/Environment/Situation:  Living Arrangements: Spouse/significant other Living conditions (as described by patient or guardian): good Who else lives in the home?: Husband How long has patient lived in current situation?: Almost 3 years What is atmosphere in current home: Comfortable  Family History:  Marital status: Married Number of Years Married: 3 What types of issues is patient dealing with in the relationship?: None reported Are you sexually active?: Yes What is your sexual orientation?: Heterosexual Has your sexual activity been affected by drugs, alcohol, medication, or emotional stress?: none reported Does patient have children?: Yes How many children?: 1 How is patient's relationship with their children?: good relationship  Childhood History:  By whom was/is the patient raised?: Grandparents Description of patient's relationship with caregiver when they were a child: the relationship was good Patient's description of current relationship with people who raised him/her: The relationship is still good How were  you disciplined when you got in trouble as a child/adolescent?: sent to my room Does patient have siblings?: Yes Number of Siblings: 2 Description of patient's current relationship with siblings: good relationship with 2 brothers Did patient suffer any verbal/emotional/physical/sexual abuse as a child?: No Did patient suffer from severe childhood neglect?: No Has patient ever been sexually abused/assaulted/raped as an adolescent or adult?: No Was the patient ever a victim of a crime or a disaster?: No Witnessed domestic violence?: No Has patient been affected by domestic violence as an adult?: No  Education:  Highest grade of school patient has completed: High school Currently a student?: No Learning disability?: No  Employment/Work Situation:   Employment Situation: On disability 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?: No  Financial Resources:   Surveyor, Quantity resources: [employer], [employer], [employer], Food stamps Does patient have a lawyer or guardian?: No  Alcohol/Substance Abuse:   What has been your use of drugs/alcohol within the last 12 months?: None reported If attempted suicide, did drugs/alcohol play a role in this?: No Alcohol/Substance Abuse Treatment Hx: Denies past history If yes, describe treatment and response: None reported Is patient motivated for change?: Yes Does patient live in an environment that promotes recovery or serves as an obstacle to recovery?: Yes - promotes recovery Describe how the environment promotes recovery or serves as an obstacle to recovery: My husband supports me Are others in the home using alcohol or other substances?: No Are significant  others in the home willing to participate in the patient's care?: Yes Describe significant others willing  to participate in the patient's care: My husband supports me Has alcohol/substance abuse ever caused legal problems?: No  Social Support System:   Patient's Community Support System: Good Describe Community Support System: Husband supports me Type of faith/religion: None reported  Leisure/Recreation:   Do You Have Hobbies?: No  Strengths/Needs:   Patient states these barriers may affect/interfere with their treatment: None reported Patient states these barriers may affect their return to the community: None reported Other important information patient would like considered in planning for their treatment: Im sill hearing voices but not having SI  Discharge Plan:   Currently receiving community mental health services: No Patient states concerns and preferences for aftercare planning are: I want to therapist and doctor to go to Patient states they will know when they are safe and ready for discharge when: When I feel safe where im at Does patient have access to transportation?: No Does patient have financial barriers related to discharge medications?: No Patient description of barriers related to discharge medications: None reported Plan for no access to transportation at discharge: Will need Will patient be returning to same living situation after discharge?: Yes  Summary/Recommendations:    The patient is a 44 year old female who presented to [facility] for suicidal ideations and worsening depression. The patient has a psychiatric history for  MDD, a history of Polysubstance Abuse (THC, Cocaine, EtOH, IV Heroine), 1 Suicide Attempt (OD 10/2021), and 3 Prior Psychiatric Hospitalizations (last-[facility] 04/2023), and no history of Self Injurious Behavior. During today's assessment the patient denies any current SI but states that she has been experiencing auditory hallucinations.  The patient reports living with her husband. The patient denies any current stressors. The patient reports receiving disability, medicare, medicaid, and food stamps. The patient denies alcohol and drug use during the assessment. The patient stated that she is not currently receiving any mental health services at the moment but would like psychiatry and therapy as follow up. Recommendations include crisis stabilization, therapeutic milieu, encourage group attendance and participation, medication management for mood stabilization, and development of a comprehensive mental wellness/sobriety plan.     Roselyn GORMAN Lento. 10/05/2024

## 2024-10-05 NOTE — H&P (Addendum)
 " Psychiatric Admission Assessment Adult  Patient Identification: Felicia Sutton MRN:  [id number] Date of Evaluation:  10/05/2024 Chief Complaint:  MDD (major depressive disorder), severe (HCC) [F32.2] Principal Diagnosis: MDD (major depressive disorder), recurrent, severe, with psychosis (HCC) Diagnosis:  Principal Problem:   MDD (major depressive disorder), recurrent, severe, with psychosis (HCC)  History of Present Illness:  Felicia Sutton is a 44 yr old female who presented on 1/14 to [facility] with SI due to worsening depression and AH, she was admitted to [facility] on 1/15.  PPHx is significant for MDD, a history of Polysubstance Abuse (THC, Cocaine, EtOH, IV Heroine), 1 Suicide Attempt (OD 10/2021), and 3 Prior Psychiatric Hospitalizations (last-[facility] 04/2023), and no history of Self Injurious Behavior.  When asked what led to her hospitalization she reports that her mom passed away 2 weeks ago from a heart attack in her sleep (per chart review mother passed away in 10/13/2020).  She reports that their relationship was strained and she always felt like she can never do anything right.  She reports that the voices started when she was young but that in the past few weeks they have gotten worse.  She reports that they tell her she should not be alive.  She also reports that she continues to grieve the passing of her son which happened a few years ago (per chart review Oct 14, 2019 from COVID).  She reports past psychiatric history significant for depression (per chart review she does have a history of Polysubstance Abuse (THC, Cocaine, EtOH, IV Heroine).  She reports no history of suicide attempts (per chart review OD 10/2021).  She reports no history of self-injurious behavior.  She reports prior psychiatric hospitalizations (per chart review 3 last-[facility] 04/2023).  She reports no significant past medical history.  She reports no significant past surgical history.  She reports no history of head trauma.  She  reports no history of seizures.  She reports NKDA.  She reports she currently lives with her ex-husband.  She reports she is not currently employed but had been working previously at [employer].  She reports she graduated high school.  She reports no alcohol use.  She reports no tobacco use.  She reports no illicit substance use (per chart review does have a history of THC, Cocaine, EtOH, IV Heroine abuse).  She reports no access to firearms.  She reports no current legal issues.  Discussed medication choices with her.  She reports being on BuSpar  and Thorazine in the past but that she did not find them helpful.  She reports being on other medications but does not remember what they were.  Discussed Abilify  and potential risks and side effects with her and she was agreeable to the trial.   Associated Signs/Symptoms: Depression Symptoms:  depressed mood, anhedonia, insomnia, fatigue, feelings of worthlessness/guilt, hopelessness, suicidal thoughts without plan, anxiety, loss of energy/fatigue, disturbed sleep, (Hypo) Manic Symptoms:  Reports None Anxiety Symptoms:  Excessive Worry, Psychotic Symptoms:  Hallucinations: Auditory PTSD Symptoms: NA Did the patient present with any abnormal findings indicating the need for additional neurological or psychological testing?  No  Total Time spent with patient: 1 hour   Past Psychiatric History:  MDD, a history of Polysubstance Abuse (THC, Cocaine, EtOH, IV Heroine), 1 Suicide Attempt (OD 10/2021), and 3 Prior Psychiatric Hospitalizations (last-[facility] 04/20[facility]), and no history of Self Injurious Behavior.  Is the patient at risk to self? Yes.    Has the patient been a risk to self in the past 6  months? No.  Has the patient been a risk to self within the distant past? Yes.    Is the patient a risk to others? No.  Has the patient been a risk to others in the past 6 months? No.  Has the patient been a risk to others within the distant past? No.    Columbia Scale:  Flowsheet Row [facility] ([facility] Most recent reading at 10/04/2024  9:00 PM ED from 10/04/2024 in [facility] Emergency Department at [facility] Most recent reading at 10/04/2024  7:55 AM ED from 10/01/2024 in [facility] Emergency Department at [facility] Most recent reading at 10/01/2024 12:11 AM  C-SSRS RISK CATEGORY High Risk High Risk No Risk     Prior Inpatient Therapy: Yes.   If yes, describe [facility] 04/2023  Prior Outpatient Therapy: No. If yes, describe N/A   Alcohol Screening: 1. How often do you have a drink containing alcohol?: Never 2. How many drinks containing alcohol do you have on a typical day when you are drinking?: 1 or 2 3. How often do you have six or more drinks on one occasion?: Never AUDIT-C Score: 0 4. How often during the last year have you found that you were not able to stop drinking once you had started?: Never 5. How often during the last year have you failed to do what was normally expected from you because of drinking?: Never 6. How often during the last year have you needed a first drink in the morning to get yourself going after a heavy drinking session?: Never 7. How often during the last year have you had a feeling of guilt of remorse after drinking?: Never 8. How often during the last year have you been unable to remember what happened the night before because you had been drinking?: Never 9. Have you or someone else been injured as a result of your drinking?: No 10. Has a relative or friend or a doctor or another health worker been concerned about your drinking or suggested you cut down?: No Alcohol Use Disorder Identification Test Final Score (AUDIT): 0 Substance Abuse History in the last 12 months:  No. Consequences of Substance Abuse: NA Previous Psychotropic Medications: Yes  Zoloft, Lexapro, Prozac , Remeron , Effexor, Abilify ,  Buspar , Psychological Evaluations: No  Past Medical History:  Past Medical History:  Diagnosis Date   GERD (gastroesophageal reflux disease) 2017   Major depression 12/2019    Past Surgical History:  Procedure Laterality Date   CHOLECYSTECTOMY  01/2019   ESOPHAGOGASTRODUODENOSCOPY (EGD) WITH PROPOFOL  N/A 02/01/2020   Procedure: ESOPHAGOGASTRODUODENOSCOPY (EGD) WITH PROPOFOL ;  Surgeon: Legrand Victory LITTIE DOUGLAS, MD;  Location: [facility] ENDOSCOPY;  Service: Gastroenterology;  Laterality: N/A;   Family History: History reviewed. No pertinent family history. Family Psychiatric  History:  Reports No Known Diagnosis', Suicides, or Substance Abuse  Tobacco Screening: Tobacco Use History[1]  BH Tobacco Counseling     Are you interested in Tobacco Cessation Medications?  No, patient refused Counseled patient on smoking cessation:  Refused/Declined practical counseling Reason Tobacco Screening Not Completed: Patient Refused Screening       Social History:  Social History   Substance and Sexual Activity  Alcohol Use Not Currently     Social History   Substance and Sexual Activity  Drug Use Not Currently    Additional Social History:  Allergies:  Allergies[2] Lab Results:  Results for orders placed or performed during the hospital encounter of 10/04/24 (from the past 48 hours)  CBC with Differential     Status: Abnormal   Collection Time: 10/04/24  3:05 AM  Result Value Ref Range   WBC 8.3 4.0 - 10.5 K/uL   RBC 3.98 3.87 - 5.11 MIL/uL   Hemoglobin 11.9 (L) 12.0 - 15.0 g/dL   HCT 64.0 (L) 63.9 - 53.9 %   MCV 90.2 80.0 - 100.0 fL   MCH 29.9 26.0 - 34.0 pg   MCHC 33.1 30.0 - 36.0 g/dL   RDW 87.4 88.4 - 84.4 %   Platelets 266 150 - 400 K/uL   nRBC 0.0 0.0 - 0.2 %   Neutrophils Relative % 55 %   Neutro Abs 4.6 1.7 - 7.7 K/uL   Lymphocytes Relative 37 %   Lymphs Abs 3.1 0.7 - 4.0 K/uL   Monocytes Relative 6 %   Monocytes Absolute 0.5 0.1 - 1.0 K/uL    Eosinophils Relative 1 %   Eosinophils Absolute 0.1 0.0 - 0.5 K/uL   Basophils Relative 1 %   Basophils Absolute 0.0 0.0 - 0.1 K/uL   Immature Granulocytes 0 %   Abs Immature Granulocytes 0.03 0.00 - 0.07 K/uL    Comment: Performed at [facility], [address] [place], [place] [address]  Comprehensive metabolic panel     Status: Abnormal   Collection Time: 10/04/24  3:05 AM  Result Value Ref Range   Sodium 141 135 - 145 mmol/L   Potassium 3.5 3.5 - 5.1 mmol/L   Chloride 105 98 - 111 mmol/L   CO2 23 22 - 32 mmol/L   Glucose, Bld 147 (H) 70 - 99 mg/dL    Comment: Glucose reference range applies only to samples taken after fasting for at least 8 hours.   BUN 12 6 - 20 mg/dL   Creatinine, Ser 9.36 0.44 - 1.00 mg/dL   Calcium 9.0 8.9 - 89.6 mg/dL   Total Protein 7.0 6.5 - 8.1 g/dL   Albumin 4.0 3.5 - 5.0 g/dL   AST 15 15 - 41 U/L   ALT 14 0 - 44 U/L   Alkaline Phosphatase 88 38 - 126 U/L   Total Bilirubin 0.5 0.0 - 1.2 mg/dL   GFR, Estimated >39 >39 mL/min    Comment: (NOTE) Calculated using the CKD-EPI Creatinine Equation (2021)    Anion gap 12 5 - 15    Comment: Performed at [facility], [address], [place], [place] [address]  Lipase, blood     Status: None   Collection Time: 10/04/24  3:05 AM  Result Value Ref Range   Lipase 25 11 - 51 U/L    Comment: Performed at [facility], [address], [place], [place] [address]  hCG, serum, qualitative     Status: None   Collection Time: 10/04/24  3:05 AM  Result Value Ref Range   Preg, Serum NEGATIVE NEGATIVE    Comment:        THE SENSITIVITY OF THIS METHODOLOGY IS >10 mIU/mL. Performed at [facility], [address] [place], [place] [address]   Acetaminophen  level     Status: Abnormal   Collection Time: 10/04/24  3:05 AM  Result Value Ref Range   Acetaminophen  (Tylenol ), Serum <10 (L) 10 - 30 ug/mL    Comment: (NOTE) Toxic concentrations can be more effectively related to post  dose interval; >200, >100, and >50 ug/mL serum concentrations correspond to toxic concentrations at 4, 8, and 12 hours post dose, respectively.  Performed at [facility], [address], [place], [place] [address]   Urinalysis, Routine w reflex microscopic -Urine, Clean Catch     Status: Abnormal   Collection Time: 10/04/24  3:11 AM  Result Value Ref Range   Color, Urine YELLOW YELLOW   APPearance CLOUDY (A) CLEAR   Specific Gravity, Urine 1.029 1.005 - 1.030   pH 5.0 5.0 - 8.0   Glucose, UA NEGATIVE NEGATIVE mg/dL   Hgb urine dipstick NEGATIVE NEGATIVE   Bilirubin Urine NEGATIVE NEGATIVE   Ketones, ur NEGATIVE NEGATIVE mg/dL   Protein, ur 30 (A) NEGATIVE mg/dL   Nitrite NEGATIVE NEGATIVE   Leukocytes,Ua TRACE (A) NEGATIVE   RBC / HPF 0-5 0 - 5 RBC/hpf   WBC, UA 0-5 0 - 5 WBC/hpf   Bacteria, UA NONE SEEN NONE SEEN   Squamous Epithelial / HPF 11-20 0 - 5 /HPF   Mucus PRESENT     Comment: Performed at [facility], [address], [place], [place] [address]    Blood Alcohol level:  Lab Results  Component Value Date   ETH <10 11/29/2021   ETH <10 07/05/2021    Metabolic Disorder Labs:  Lab Results  Component Value Date   HGBA1C 5.4 12/02/2021   MPG 108 12/02/2021   MPG 96.8 02/01/2020   No results found for: PROLACTIN Lab Results  Component Value Date   CHOL 144 12/02/2021   TRIG 121 12/02/2021   HDL 36 (L) 12/02/2021   CHOLHDL 4.0 12/02/2021   VLDL 24 12/02/2021   LDLCALC 84 12/02/2021    Current Medications: Current Facility-Administered Medications  Medication Dose Route Frequency Provider Last Rate Last Admin   acetaminophen  (TYLENOL ) tablet 650 mg  650 mg Oral Q6H PRN Coleman, Carolyn H, NP       alum & mag hydroxide-simeth (MAALOX/MYLANTA) 200-200-20 MG/5ML suspension 30 mL  30 mL Oral Q4H PRN Coleman, Carolyn H, NP       ARIPiprazole  (ABILIFY ) tablet 2 mg  2 mg Oral Once Debria Broecker S, DO       Followed by   NOREEN ON 10/06/2024]  ARIPiprazole  (ABILIFY ) tablet 5 mg  5 mg Oral Daily Misael Mcgaha S, DO       haloperidol  (HALDOL ) tablet 5 mg  5 mg Oral TID PRN Mardy Elveria DEL, NP       And   diphenhydrAMINE  (BENADRYL ) capsule 50 mg  50 mg Oral TID PRN Mardy Elveria DEL, NP       haloperidol  lactate (HALDOL ) injection 5 mg  5 mg Intramuscular TID PRN Mardy Elveria DEL, NP       And   diphenhydrAMINE  (BENADRYL ) injection 50 mg  50 mg Intramuscular TID PRN Mardy Elveria DEL, NP       And   LORazepam  (ATIVAN ) injection 2 mg  2 mg Intramuscular TID PRN Mardy Elveria DEL, NP       haloperidol  lactate (HALDOL ) injection 10 mg  10 mg Intramuscular TID PRN Mardy Elveria DEL, NP       And   diphenhydrAMINE  (BENADRYL ) injection 50 mg  50 mg Intramuscular TID PRN Mardy Elveria DEL, NP       And   LORazepam  (ATIVAN ) injection 2 mg  2 mg Intramuscular TID PRN Coleman, Carolyn H, NP       hydrOXYzine  (ATARAX ) tablet 25 mg  25 mg Oral TID PRN Coleman, Carolyn H, NP   25 mg at 10/04/24 2134   magnesium  hydroxide (MILK OF MAGNESIA) suspension 30 mL  30 mL Oral Daily PRN Coleman, Carolyn H, NP       ondansetron  (ZOFRAN -ODT) disintegrating tablet 4 mg  4 mg Oral Q8H PRN Coleman, Carolyn H, NP       traZODone  (DESYREL ) tablet 50 mg  50 mg Oral QHS PRN Coleman, Carolyn H, NP   50 mg at 10/04/24 2135   PTA Medications: Medications Prior to Admission  Medication Sig Dispense Refill Last Dose/Taking   dicyclomine  (BENTYL ) 20 MG tablet Take 1 tablet (20 mg total) by mouth 2 (two) times daily. (Patient not taking: Reported on 10/04/2024) 20 tablet 0    ibuprofen  (ADVIL ) 400 MG tablet Take 1 tablet (400 mg total) by mouth 4 (four) times daily for 5 days. (Patient not taking: Reported on 10/04/2024) 30 tablet 0    oxyCODONE -acetaminophen  (PERCOCET) 5-325 MG tablet Take 1 tablet by mouth every 8 (eight) hours as needed for severe pain (pain score 7-10). (Patient not taking: Reported on 10/04/2024) 10 tablet 0     AIMS:  ,  ,  ,  ,  ,   ,    Musculoskeletal: Strength & Muscle Tone: within normal limits Gait & Station: normal Patient leans: N/A            Psychiatric Specialty Exam:  Presentation  General Appearance: Appropriate for Environment; Casual  Eye Contact:Good  Speech:Clear and Coherent; Normal Rate  Speech Volume:Normal  Handedness:Right   Mood and Affect  Mood:Depressed  Affect:Congruent; Depressed; Flat   Thought Process  Thought Processes:Coherent; Goal Directed  Duration of Psychotic Symptoms:N/A Past Diagnosis of Schizophrenia or Psychoactive disorder: No data recorded Descriptions of Associations:Intact  Orientation:Full (Time, Place and Person)  Thought Content:Logical  Hallucinations:Hallucinations: Auditory Description of Auditory Hallucinations: telling her she shouldn't be alive  Ideas of Reference:None  Suicidal Thoughts:Suicidal Thoughts: Yes, Active  Homicidal Thoughts:Homicidal Thoughts: No   Sensorium  Memory:Immediate Fair  Judgment:Intact  Insight:Present   Executive Functions  Concentration:Fair  Attention Span:Fair  Recall:Fair  Fund of Knowledge:Fair  Language:Fair   Psychomotor Activity  Psychomotor Activity:Psychomotor Activity: Normal   Assets  Assets:Resilience   Sleep  Sleep:Sleep: Poor  Estimated Sleeping Duration (Last 24 Hours): 6.00-7.50 hours   Physical Exam: Physical Exam Vitals and nursing note reviewed.  Constitutional:      General: She is not in acute distress.    Appearance: Normal appearance. She is obese. She is not ill-appearing or toxic-appearing.  HENT:     Head: Normocephalic and atraumatic.  Pulmonary:     Effort: Pulmonary effort is normal.  Musculoskeletal:        General: Normal range of motion.  Neurological:     General: No focal deficit present.     Mental Status: She is alert.    Review of Systems  Respiratory:  Negative for cough and shortness of breath.   Cardiovascular:   Negative for chest pain.  Gastrointestinal:  Negative for abdominal pain, constipation, diarrhea, nausea and vomiting.  Neurological:  Negative for dizziness, weakness and headaches.  Psychiatric/Behavioral:  Positive for depression, hallucinations (AH) and suicidal ideas. The patient is nervous/anxious.    Blood pressure 97/60, pulse 81, temperature 98.3 F (36.8 C), temperature source Oral, resp. rate 16, height 5' 2 (1.575 m), weight 98.3 kg, last menstrual period 10/01/2024, SpO2 98%. Body mass index is 39.65 kg/m.  Treatment Plan Summary: Daily contact  with patient to assess and evaluate symptoms and progress in treatment and Medication management  Felicia Sutton is a 44 yr old female who presented on 1/14 to [facility] with SI due to worsening depression and AH, she was admitted to [facility] on 1/15.  PPHx is significant for MDD, a history of Polysubstance Abuse (THC, Cocaine, EtOH, IV Heroine), 1 Suicide Attempt (OD 10/2021), and 3 Prior Psychiatric Hospitalizations (last-[facility]), and no history of Self Injurious Behavior.   Clarke reports symptoms that meet criteria for MDD with psychotic features, however, her history does not seem to be reliable as there are discrepancies.  We will start Abilify  to address her AH and depression.  We will consider starting and antidepressant after evaluating her response to the Abilify .  We will order a UDS, Lipid Panel, and A1c.    MDD, Recurrent, Severe, w/ Psychotic Features: -Start Abilify  2 mg for psychosis and depression -Continue Agitation Protocol: Haldol /Ativan /Benadryl    -Continue PRN's: Tylenol , Maalox, Atarax , Milk of Magnesia, Trazodone     Observation Level/Precautions:  15 minute checks  Laboratory:  CMP: WNL,  CBC: WNL except  Hem: 11.9, HCT: 35.9, hCG: Neg, EKG: NSR w/ Qtc: 403 UDS, Lipid Panel, A1c ordered  Psychotherapy:    Medications:  Abilify   Consultations:    Discharge Concerns:    Estimated LOS: 4-6 days  Other:      Physician Treatment Plan for Primary Diagnosis: MDD (major depressive disorder), recurrent, severe, with psychosis (HCC) Long Term Goal(s): Improvement in symptoms so as ready for discharge  Short Term Goals: Ability to identify changes in lifestyle to reduce recurrence of condition will improve, Ability to verbalize feelings will improve, Ability to disclose and discuss suicidal ideas, Ability to identify and develop effective coping behaviors will improve, and Ability to identify triggers associated with substance abuse/mental health issues will improve  Physician Treatment Plan for Secondary Diagnosis: Principal Problem:   MDD (major depressive disorder), recurrent, severe, with psychosis (HCC)  Long Term Goal(s): Improvement in symptoms so as ready for discharge  Short Term Goals: Ability to identify changes in lifestyle to reduce recurrence of condition will improve, Ability to verbalize feelings will improve, Ability to disclose and discuss suicidal ideas, Ability to identify and develop effective coping behaviors will improve, and Ability to identify triggers associated with substance abuse/mental health issues will improve  I certify that inpatient services furnished can reasonably be expected to improve the patient's condition.    Marsa GORMAN Rosser, DO 1/15/202610:25 AM     [1]  Social History Tobacco Use  Smoking Status Never   Passive exposure: Never  Smokeless Tobacco Never  [2] No Known Allergies  "

## 2024-10-05 NOTE — Group Note (Signed)
 Date:  10/05/2024 Time:  4:36 PM  Group Topic/Focus:Occupational therapy  Grounding techniques in occupational therapy help adult mental health patients manage stress, anxiety, and emotional distress by bringing attention to the present moment. These techniques use sensory input, physical movement, breathing, or cognitive focus to support emotional regulation and improve participation in daily activities. By integrating grounding strategies into meaningful occupations and routines, occupational therapists promote coping skills, independence, and overall mental well-being.     Participation Level:  Active   Felicia Sutton 10/05/2024, 4:36 PM

## 2024-10-05 NOTE — Plan of Care (Signed)
   Problem: Education: Goal: Knowledge of [facility] Education information/materials will improve Outcome: Progressing Goal: Emotional status will improve Outcome: Progressing Goal: Mental status will improve Outcome: Progressing Goal: Verbalization of understanding the information provided will improve Outcome: Progressing

## 2024-10-05 NOTE — Progress Notes (Signed)
 D. Pt presents with a sad affect , depressed mood, calm, cooperative behavior- has been visible in the milieu, observed attending groups. Pt reported poor sleep last night, described her appetite and concentration as 'poor', and energy level as 'low'. Per pt's self inventory, pt rated her depression, hopelessness and anxiety all 9/10. Pt's stated goal today is to get better'. When patient asked about SI, pt stated, I feel safe in here.  Pt denied A/VH and doesn't appear to be responding to internal stimuli. A. Labs and vitals monitored. Pt given and educated on medications- given prn hydroxyzine  for increased anxiety. Pt supported emotionally and encouraged to express concerns and ask questions.   R. Pt remains safe with 15 minute checks. Will continue POC.

## 2024-10-05 NOTE — [facility] Group Notes (Signed)
 [facility] Group Notes:  (Nursing/MHT/Case Management/Adjunct)  Date:  10/05/2024  Time:  9:24 PM  Type of Therapy:  Wrap up group  Participation Level:  Active  Participation Quality:  Appropriate  Affect:  Appropriate  Cognitive:  Appropriate  Insight:  Appropriate  Engagement in Group:  Engaged  Modes of Intervention:  Education  Summary of Progress/Problems: Goal to talk to her husband. Goal was met, rated day 6/10.  Felicia Sutton [place] 10/05/2024, 9:24 PM

## 2024-10-05 NOTE — [facility] Suicide Risk Assessment (Signed)
 [facility] Admission Suicide Risk Assessment   Nursing information obtained from:  Patient Demographic factors:  Caucasian Current Mental Status:  Suicidal ideation indicated by patient Loss Factors:  Loss of significant relationship Historical Factors:  Prior suicide attempts Risk Reduction Factors:  Sense of responsibility to family  Total Time spent with patient: 1 hour Principal Problem: MDD (major depressive disorder), recurrent, severe, with psychosis (HCC) Diagnosis:  Principal Problem:   MDD (major depressive disorder), recurrent, severe, with psychosis (HCC)  Subjective Data:  Felicia Sutton is a 44 yr old female who presented on 1/14 to [facility] with SI due to worsening depression and AH, she was admitted to [facility] on 1/15.  PPHx is significant for MDD, a history of Polysubstance Abuse (THC, Cocaine, EtOH, IV Heroine), 1 Suicide Attempt (OD 10/2021), and 3 Prior Psychiatric Hospitalizations (last-[facility]/20[facility]), and no history of Self Injurious Behavior.   When asked what led to her hospitalization she reports that her mom passed away 2 weeks ago from a heart attack in her sleep (per chart review mother passed away in 29-Oct-2020).  She reports that their relationship was strained and she always felt like she can never do anything right.  She reports that the voices started when she was young but that in the past few weeks they have gotten worse.  She reports that they tell her she should not be alive.  She also reports that she continues to grieve the passing of her son which happened a few years ago (per chart review October 30, 2019 from COVID).   She reports past psychiatric history significant for depression (per chart review she does have a history of Polysubstance Abuse (THC, Cocaine, EtOH, IV Heroine).  She reports no history of suicide attempts (per chart review OD 10/2021).  She reports no history of self-injurious behavior.  She reports prior psychiatric hospitalizations (per chart review 3 last-[facility]).  She reports no significant past medical history.  She reports no significant past surgical history.  She reports no history of head trauma.  She reports no history of seizures.  She reports NKDA.   She reports she currently lives with her ex-husband.  She reports she is not currently employed but had been working previously at [employer].  She reports she graduated high school.  She reports no alcohol use.  She reports no tobacco use.  She reports no illicit substance use (per chart review does have a history of THC, Cocaine, EtOH, IV Heroine abuse).  She reports no access to firearms.  She reports no current legal issues.   Discussed medication choices with her.  She reports being on BuSpar  and Thorazine in the past but that she did not find them helpful.  She reports being on other medications but does not remember what they were.  Discussed Abilify  and potential risks and side effects with her and she was agreeable to the trial.  Continued Clinical Symptoms:  Alcohol Use Disorder Identification Test Final Score (AUDIT): 0 The Alcohol Use Disorders Identification Test, Guidelines for Use in Primary Care, Second Edition.  World Science Writer [facility]). Score between 0-7:  no or low risk or alcohol related problems. Score between 8-15:  moderate risk of alcohol related problems. Score between 16-19:  high risk of alcohol related problems. Score 20 or above:  warrants further diagnostic evaluation for alcohol dependence and treatment.   CLINICAL FACTORS:   Depression:   Severe More than one psychiatric diagnosis Currently Psychotic Unstable or Poor Therapeutic Relationship Previous Psychiatric  Diagnoses and Treatments Medical Diagnoses and Treatments/Surgeries   Musculoskeletal: Strength & Muscle Tone: within normal limits Gait & Station: normal Patient leans: N/A  Psychiatric Specialty Exam:  Presentation  General Appearance:  Appropriate for Environment;  Casual  Eye Contact: Good  Speech: Clear and Coherent; Normal Rate  Speech Volume: Normal  Handedness: Right   Mood and Affect  Mood: Depressed  Affect: Congruent; Depressed; Flat   Thought Process  Thought Processes: Coherent; Goal Directed  Descriptions of Associations:Intact  Orientation:Full (Time, Place and Person)  Thought Content:Logical  History of Schizophrenia/Schizoaffective disorder:No data recorded Duration of Psychotic Symptoms:No data recorded Hallucinations:Hallucinations: Auditory Description of Auditory Hallucinations: telling her she shouldn't be alive  Ideas of Reference:None  Suicidal Thoughts:Suicidal Thoughts: Yes, Active  Homicidal Thoughts:Homicidal Thoughts: No   Sensorium  Memory: Immediate Fair  Judgment: Intact  Insight: Present   Executive Functions  Concentration: Fair  Attention Span: Fair  Recall: [employer] of Knowledge: Fair  Language: Fair   Psychomotor Activity  Psychomotor Activity: Psychomotor Activity: Normal   Assets  Assets: Resilience   Sleep  Sleep: Sleep: Poor    Physical Exam: Physical Exam Vitals and nursing note reviewed.  Constitutional:      General: She is not in acute distress.    Appearance: Normal appearance. She is obese. She is not ill-appearing or toxic-appearing.  HENT:     Head: Normocephalic and atraumatic.  Pulmonary:     Effort: Pulmonary effort is normal.  Musculoskeletal:        General: Normal range of motion.  Neurological:     General: No focal deficit present.     Mental Status: She is alert.    Review of Systems  Respiratory:  Negative for cough and shortness of breath.   Cardiovascular:  Negative for chest pain.  Gastrointestinal:  Negative for abdominal pain, constipation, diarrhea, nausea and vomiting.  Neurological:  Negative for dizziness, weakness and headaches.  Psychiatric/Behavioral:  Positive for depression, hallucinations (AH)  and suicidal ideas. The patient is nervous/anxious.    Blood pressure 97/60, pulse 81, temperature 98.3 F (36.8 C), temperature source Oral, resp. rate 16, height 5' 2 (1.575 m), weight 98.3 kg, last menstrual period 10/01/2024, SpO2 98%. Body mass index is 39.65 kg/m.   COGNITIVE FEATURES THAT CONTRIBUTE TO RISK:  Loss of executive function and Thought constriction (tunnel vision)    SUICIDE RISK:   Moderate:  Frequent suicidal ideation with limited intensity, and duration, some specificity in terms of plans, no associated intent, good self-control, limited dysphoria/symptomatology, some risk factors present, and identifiable protective factors, including available and accessible social support.  PLAN OF CARE:  Felicia Sutton is a 44 yr old female who presented on 1/14 to [facility] with SI due to worsening depression and AH, she was admitted to [facility] on 1/15.  PPHx is significant for MDD, a history of Polysubstance Abuse (THC, Cocaine, EtOH, IV Heroine), 1 Suicide Attempt (OD 10/2021), and 3 Prior Psychiatric Hospitalizations (last-[facility]20[facility]), and no history of Self Injurious Behavior.     Felicia Sutton reports symptoms that meet criteria for MDD with psychotic features, however, her history does not seem to be reliable as there are discrepancies.  We will start Abilify  to address her AH and depression.  We will consider starting and antidepressant after evaluating her response to the Abilify .  We will order a UDS, Lipid Panel, and A1c.      MDD, Recurrent, Severe, w/ Psychotic Features: -Start Abilify  2 mg for psychosis and  depression -Continue Agitation Protocol: Haldol /Ativan /Benadryl      -Continue PRN's: Tylenol , Maalox, Atarax , Milk of Magnesia, Trazodone   I certify that inpatient services furnished can reasonably be expected to improve the patient's condition.   Felicia GORMAN Rosser, DO 10/05/2024, 10:26 AM

## 2024-10-05 NOTE — Group Note (Signed)
 LCSW Group Therapy Note   Group Date: 10/05/2024 Start Time: 1100 End Time: 1200   Participation:  patient was present and actively participated in the discussion.  Type of Therapy:  Group Therapy  Topic:  Healing Flames: Navigating Anger with Compassion  Objective:  Foster self-awareness and promote compassion toward oneself and others when dealing with anger.  Goals:  Help participants understand the underlying emotions and needs fueling anger. Provide coping strategies for healthier emotional expression and anger management.  Summary: This session explored anger as a volcano--an explosion driven by deeper feelings and unmet needs. Participants learned to identify anger triggers and underlying emotions, then practiced coping strategies like deep breathing, physical activity, and journaling. The group discussed healthy ways to manage anger before it escalates, using both personal reflection and shared experiences.  Therapeutic Modalities: Cognitive Behavioral Therapy (CBT): Challenging thoughts that fuel anger. Mindfulness: Increasing awareness of emotions and sensations.   Khadijatou Borak O Pasty Manninen, LCSWA 10/05/2024  2:19 PM

## 2024-10-06 ENCOUNTER — Encounter (HOSPITAL_COMMUNITY): Payer: Self-pay

## 2024-10-06 DIAGNOSIS — F333 Major depressive disorder, recurrent, severe with psychotic symptoms: Secondary | ICD-10-CM | POA: Diagnosis not present

## 2024-10-06 MED ORDER — PROPRANOLOL HCL 10 MG PO TABS
10.0000 mg | ORAL_TABLET | Freq: Two times a day (BID) | ORAL | Status: DC
  Administered 2024-10-06 – 2024-10-13 (×14): 10 mg via ORAL
  Filled 2024-10-06 (×9): qty 1
  Filled 2024-10-06: qty 14
  Filled 2024-10-06 (×5): qty 1

## 2024-10-06 NOTE — Group Note (Signed)
 Date:  10/06/2024 Time:  6:54 PM  Group Topic/Focus:  Making Healthy Choices:   The focus of this group is to help patients identify negative/unhealthy choices they were using prior to admission and identify positive/healthier diet choices to promote brain health.   Participation Level:  Active  Participation Quality:  Appropriate  Affect:  Appropriate  Cognitive:  Appropriate  Insight: Appropriate  Engagement in Group:  Engaged  Modes of Intervention:  Discussion and Education  Additional Comments:    Juliene CHRISTELLA Huddle 10/06/2024, 6:54 PM

## 2024-10-06 NOTE — Progress Notes (Signed)
 SPIRITUAL CARE AND COUNSELING CONSULT NOTE   VISIT SUMMARY Chaplains received a consult to provide Felicia Sutton with some support around the recent loss of her only child (20 y-o).  She has been in shock and experiencing anger and deep sadness. She also had some suicidal ideation because she wanted to go and be with him.  This scared her because she had not experienced SI previously.  She feels safer being here in the hospital and has been finding groups a helpful distraction. She has good support in her husband, but she doesn't have anyone else who also is grieving her son.  I provided grief support through listening, facilitating the sharing of feelings and establishing a supportive and caring relationship with her.  Please consult us  again if she would like to process further.   SPIRITUAL ENCOUNTER                                                                                                                                                                      Type of Visit: Initial Care provided to:: Patient Referral source: Patient request Reason for visit: Grief/loss OnCall Visit: No    INTERVENTIONS   Spiritual Care Interventions Made: Established relationship of care and support, Compassionate presence, Reflective listening, Bereavement/grief support, Provided grief education    INTERVENTION OUTCOMES   Outcomes: Connection to spiritual care, Reduced isolation    If immediate needs arise, please contact [facility] / Behavioral Health 24 hour on call [phone]   Evins Lamarr Caldron, Chaplain  10/06/2024 4:52 PM

## 2024-10-06 NOTE — Group Note (Signed)
 Date:  10/06/2024 Time:  2:37 PM  Group Topic/Focus: Guess that song This song speaks to the quiet exhaustion of adulthood, where responsibilities pile up and emotions are often hidden behind a practiced smile. It reflects the struggle of feeling lost, overwhelmed, and pressured to appear okay while internally battling anxiety and self-doubt. Through honest lyrics and a steady rhythm, the music reminds listeners that its normal to feel broken sometimes--and that asking for help is not a weakness.    Participation Level:  Active  Participation Quality:  Appropriate  Affect:  Appropriate  Cognitive:  Alert  Insight: Appropriate  Engagement in Group:  Engaged  Modes of Intervention:  Education  Additional Comments:    Felicia Sutton CHRISTELLA Fredericks 10/06/2024, 2:37 PM

## 2024-10-06 NOTE — Group Note (Signed)
 Recreation Therapy Group Note   Group Topic:Problem Solving  Group Date: 10/06/2024 Start Time: 0935 End Time: 1005 Facilitators: Kenston Longton-McCall, LRT,CTRS Location: [address] Dayroom   Group Topic: Problem Solving  Goal Area(s) Addresses:  Patient will effectively work in a team with other group members. Patient will verbalize importance of using appropriate problem solving techniques.  Patient will identify positive change associated with effective problem solving skills.   Behavioral Response: Engaged  Intervention: Worksheet  Activity: Dentist. Patients worked together to complete a front and back worksheet of brain teasers. Patients had to work through different thoughts and ideas to come up with the correct answer. Once patients finished, LRT and patients went over the answers.    Education: Problem solving, Team Building, Communication  Education Outcome: Acknowledges understanding/In group clarification offered/Needs additional education.    Affect/Mood: Appropriate   Participation Level: Engaged   Participation Quality: Independent   Behavior: Appropriate   Speech/Thought Process: Focused   Insight: Good   Judgement: Good   Modes of Intervention: Worksheet   Patient Response to Interventions:  Engaged   Education Outcome:  In group clarification offered    Clinical Observations/Individualized Feedback: Pt attended and participated in group session.      Plan: Continue to engage patient in RT group sessions 2-3x/week.   Zakhari Fogel-McCall, LRT,CTRS 10/06/2024 12:36 PM

## 2024-10-06 NOTE — Plan of Care (Signed)
   Problem: Safety: Goal: Periods of time without injury will increase Outcome: Progressing

## 2024-10-06 NOTE — Group Note (Deleted)
 Date:  10/06/2024 Time:  8:41 PM  Group Topic/Focus:  Wrap-Up Group:   The focus of this group is to help patients review their daily goal of treatment and discuss progress on daily workbooks.     Participation Level:  {BHH PARTICIPATION OZCZO:77735}  Participation Quality:  {BHH PARTICIPATION QUALITY:22265}  Affect:  {BHH AFFECT:22266}  Cognitive:  {BHH COGNITIVE:22267}  Insight: {BHH Insight2:20797}  Engagement in Group:  {BHH ENGAGEMENT IN HMNLE:77731}  Modes of Intervention:  {BHH MODES OF INTERVENTION:22269}  Additional Comments:  ***  Nolyn Eilert T Deidrea Gaetz 10/06/2024, 8:41 PM

## 2024-10-06 NOTE — Progress Notes (Signed)
 [facility] MD Progress Note  10/06/2024 12:50 PM Felicia Sutton  MRN:  [id number] Subjective:   Felicia Sutton is a 44 yr old female who presented on 1/14 to [facility] with SI due to worsening depression and AH, she was admitted to [facility] on 1/15.  PPHx is significant for MDD, a history of Polysubstance Abuse (THC, Cocaine, EtOH, IV Heroine), 1 Suicide Attempt (OD 10/2021), and 3 Prior Psychiatric Hospitalizations (last-[facility] 04/2023), and no history of Self Injurious Behavior.    Case was discussed in the multidisciplinary team. MAR was reviewed and patient was compliant with medications.  She received PRN Tylenol , Hydroxyzine  x2, and Trazodone  yesterday.   Psychiatric Team made the following recommendations yesterday: -Start Abilify  2 mg for psychosis and depression    On interview today patient reports she slept fair last night.  She reports her appetite is doing good.  She reports no SI, HI, or VH.  She reports continued AH.  She reports no Paranoia or Ideas of Reference.  She reports no issues with her medications.  She reports a significant increase in her anxiety.  She reports that it caused her to wake up multiple times last night.  Discussed propranolol  with her and potential risks and side effects and she was agreeable with the trial.  Discussed with her that since she tolerated the start of the Abilify  we would increase it further today and she was agreeable with this.  She reports no other concerns at present.  Principal Problem: MDD (major depressive disorder), recurrent, severe, with psychosis (HCC) Diagnosis: Principal Problem:   MDD (major depressive disorder), recurrent, severe, with psychosis (HCC)  Total Time spent with patient:  I personally spent 35 minutes on the unit in direct patient care. The direct patient care time included face-to-face time with the patient, reviewing the patient's chart, communicating with other professionals, and coordinating care.    Past Psychiatric  History:  MDD, a history of Polysubstance Abuse (THC, Cocaine, EtOH, IV Heroine), 1 Suicide Attempt (OD 10/2021), and 3 Prior Psychiatric Hospitalizations (last-[facility] 04/2023), and no history of Self Injurious Behavior.   Past Medical History:  Past Medical History:  Diagnosis Date   GERD (gastroesophageal reflux disease) 2017   Major depression 12/2019    Past Surgical History:  Procedure Laterality Date   CHOLECYSTECTOMY  01/2019   ESOPHAGOGASTRODUODENOSCOPY (EGD) WITH PROPOFOL  N/A 02/01/2020   Procedure: ESOPHAGOGASTRODUODENOSCOPY (EGD) WITH PROPOFOL ;  Surgeon: Legrand Victory LITTIE DOUGLAS, MD;  Location: [facility] ENDOSCOPY;  Service: Gastroenterology;  Laterality: N/A;   Family History: History reviewed. No pertinent family history. Family Psychiatric  History:  Reports No Known Diagnosis', Suicides, or Substance Abuse   Social History:  Social History   Substance and Sexual Activity  Alcohol Use Not Currently     Social History   Substance and Sexual Activity  Drug Use Not Currently    Social History   Socioeconomic History   Marital status: Single    Spouse name: Not on file   Number of children: Not on file   Years of education: Not on file   Highest education level: Not on file  Occupational History   Not on file  Tobacco Use   Smoking status: Never    Passive exposure: Never   Smokeless tobacco: Never  Vaping Use   Vaping status: Never Used  Substance and Sexual Activity   Alcohol use: Not Currently   Drug use: Not Currently   Sexual activity: Not Currently  Other Topics Concern   Not  on file  Social History Narrative   ** Merged History Encounter **       Social Drivers of Health   Tobacco Use: Low Risk (10/04/2024)   Patient History    Smoking Tobacco Use: Never    Smokeless Tobacco Use: Never    Passive Exposure: Never  Financial Resource Strain: Not on file  Food Insecurity: No Food Insecurity (10/04/2024)   Epic    Worried About Programme Researcher, Broadcasting/film/video in  the Last Year: Never true    Ran Out of Food in the Last Year: Never true  Transportation Needs: No Transportation Needs (10/04/2024)   Epic    Lack of Transportation (Medical): No    Lack of Transportation (Non-Medical): No  Physical Activity: Not on file  Stress: Not on file  Social Connections: Unknown (01/25/2022)   Received from [facility]   Social Network    Social Network: Not on file  Depression [phone]): Not on file  Alcohol Screen: Low Risk (10/04/2024)   Alcohol Screen    Last Alcohol Screening Score (AUDIT): 0  Housing: Low Risk (10/04/2024)   Epic    Unable to Pay for Housing in the Last Year: No    Number of Times Moved in the Last Year: 0    Homeless in the Last Year: No  Utilities: Not At Risk (10/04/2024)   Epic    Threatened with loss of utilities: No  Health Literacy: Not on file   Additional Social History:                         Sleep: Fair Estimated Sleeping Duration (Last 24 Hours): 7.75-8.25 hours  Appetite:  Good  Current Medications: Current Facility-Administered Medications  Medication Dose Route Frequency Provider Last Rate Last Admin   acetaminophen  (TYLENOL ) tablet 650 mg  650 mg Oral Q6H PRN Coleman, Carolyn H, NP   650 mg at 10/05/24 2141   alum & mag hydroxide-simeth (MAALOX/MYLANTA) 200-200-20 MG/5ML suspension 30 mL  30 mL Oral Q4H PRN Coleman, Carolyn H, NP       ARIPiprazole  (ABILIFY ) tablet 5 mg  5 mg Oral Daily Javan Gonzaga S, DO   5 mg at 10/06/24 9162   haloperidol  (HALDOL ) tablet 5 mg  5 mg Oral TID PRN Mardy Elveria DEL, NP       And   diphenhydrAMINE  (BENADRYL ) capsule 50 mg  50 mg Oral TID PRN Mardy Elveria DEL, NP       haloperidol  lactate (HALDOL ) injection 5 mg  5 mg Intramuscular TID PRN Mardy Elveria DEL, NP       And   diphenhydrAMINE  (BENADRYL ) injection 50 mg  50 mg Intramuscular TID PRN Mardy Elveria DEL, NP       And   LORazepam  (ATIVAN ) injection 2 mg  2 mg Intramuscular TID PRN Mardy Elveria DEL, NP       haloperidol  lactate (HALDOL ) injection 10 mg  10 mg Intramuscular TID PRN Mardy Elveria DEL, NP       And   diphenhydrAMINE  (BENADRYL ) injection 50 mg  50 mg Intramuscular TID PRN Mardy Elveria DEL, NP       And   LORazepam  (ATIVAN ) injection 2 mg  2 mg Intramuscular TID PRN Coleman, Carolyn H, NP       hydrOXYzine  (ATARAX ) tablet 25 mg  25 mg Oral TID PRN Coleman, Carolyn H, NP   25 mg at 10/06/24 1011   magnesium  hydroxide (MILK OF MAGNESIA)  suspension 30 mL  30 mL Oral Daily PRN Coleman, Carolyn H, NP       ondansetron  (ZOFRAN -ODT) disintegrating tablet 4 mg  4 mg Oral Q8H PRN Coleman, Carolyn H, NP       propranolol  (INDERAL ) tablet 10 mg  10 mg Oral BID Chade Pitner S, DO       traZODone  (DESYREL ) tablet 50 mg  50 mg Oral QHS PRN Coleman, Carolyn H, NP   50 mg at 10/05/24 2103    Lab Results: No results found for this or any previous visit (from the past 48 hours).  Blood Alcohol level:  Lab Results  Component Value Date   ETH <10 11/29/2021   ETH <10 07/05/2021    Metabolic Disorder Labs: Lab Results  Component Value Date   HGBA1C 5.4 12/02/2021   MPG 108 12/02/2021   MPG 96.8 02/01/2020   No results found for: PROLACTIN Lab Results  Component Value Date   CHOL 144 12/02/2021   TRIG 121 12/02/2021   HDL 36 (L) 12/02/2021   CHOLHDL 4.0 12/02/2021   VLDL 24 12/02/2021   LDLCALC 84 12/02/2021    Physical Findings: AIMS:  ,  ,  ,  ,  ,  ,   CIWA:    COWS:     Musculoskeletal: Strength & Muscle Tone: within normal limits Gait & Station: normal Patient leans: N/A  Psychiatric Specialty Exam:  Presentation  General Appearance:  Appropriate for Environment; Casual  Eye Contact: Good  Speech: Clear and Coherent; Normal Rate  Speech Volume: Normal  Handedness: Right   Mood and Affect  Mood: Depressed  Affect: Congruent; Depressed; Flat; Constricted   Thought Process  Thought Processes: Coherent; Goal  Directed  Descriptions of Associations:Intact  Orientation:Full (Time, Place and Person)  Thought Content:Logical  History of Schizophrenia/Schizoaffective disorder:No data recorded Duration of Psychotic Symptoms:No data recorded Hallucinations:Hallucinations: Auditory Description of Auditory Hallucinations: telling her she shouldn't be alive  Ideas of Reference:None  Suicidal Thoughts:Suicidal Thoughts: No  Homicidal Thoughts:Homicidal Thoughts: No   Sensorium  Memory: Immediate Fair; Recent Fair  Judgment: Intact  Insight: Present   Executive Functions  Concentration: Fair  Attention Span: Fair  Recall: [employer] of Knowledge: Fair  Language: Fair   Psychomotor Activity  Psychomotor Activity: Psychomotor Activity: Normal   Assets  Assets: Resilience   Sleep  Sleep: Sleep: Fair    Physical Exam: Physical Exam Vitals and nursing note reviewed.  Constitutional:      General: She is not in acute distress.    Appearance: Normal appearance. She is obese. She is not ill-appearing or toxic-appearing.  HENT:     Head: Normocephalic and atraumatic.  Pulmonary:     Effort: Pulmonary effort is normal.  Musculoskeletal:        General: Normal range of motion.  Neurological:     General: No focal deficit present.     Mental Status: She is alert.    Review of Systems  Respiratory:  Negative for cough and shortness of breath.   Cardiovascular:  Negative for chest pain.  Gastrointestinal:  Negative for abdominal pain, constipation, diarrhea, nausea and vomiting.  Neurological:  Negative for dizziness, weakness and headaches.  Psychiatric/Behavioral:  Positive for depression and hallucinations (AH). Negative for suicidal ideas. The patient is nervous/anxious.    Blood pressure 105/74, pulse 74, temperature (!) 97.5 F (36.4 C), temperature source Oral, resp. rate 16, height 5' 2 (1.575 m), weight 98.3 kg, last menstrual period 10/01/2024,  SpO2 98%.  Body mass index is 39.65 kg/m.   Treatment Plan Summary: Daily contact with patient to assess and evaluate symptoms and progress in treatment and Medication management  Felicia Sutton is a 44 yr old female who presented on 1/14 to [facility] with SI due to worsening depression and AH, she was admitted to [facility] on 1/15.  PPHx is significant for MDD, a history of Polysubstance Abuse (THC, Cocaine, EtOH, IV Heroine), 1 Suicide Attempt (OD 10/2021), and 3 Prior Psychiatric Hospitalizations (last-[facility] 04/2023), and no history of Self Injurious Behavior.    Felicia Sutton tolerated starting the Abilify  yesterday and so we will continue with the planned increase this morning.  She is having significant anxiety so we will start low-dose propranolol  to address this.  We will not make any other changes to her medication at this time.  We will continue to monitor.   MDD, Recurrent, Severe, w/ Psychotic Features: -Start Propanolol 10 mg BID for anxiety -Increase Abilify  to 5 mg for psychosis and depression -Continue Agitation Protocol: Haldol /Ativan /Benadryl      -Continue PRN's: Tylenol , Maalox, Atarax , Milk of Magnesia, Trazodone    --  The risks/benefits/side-effects/alternatives to medications were discussed in detail with the patient and time was given for questions. The patient consents to medication trials.                -- Metabolic profile and EKG monitoring obtained while on an atypical antipsychotic (BMI: 39.65, Lipid Panel: Ordered, HbgA1c: Ordered, EKG: NSR w/QTc: 403)              -- Encouraged patient to participate in unit milieu and in scheduled group therapies              -- Short Term Goals: Ability to identify changes in lifestyle to reduce recurrence of condition will improve, Ability to verbalize feelings will improve, Ability to disclose and discuss suicidal ideas, Ability to demonstrate self-control will improve, Ability to identify and develop effective coping behaviors will  improve, Ability to maintain clinical measurements within normal limits will improve, Compliance with prescribed medications will improve, and Ability to identify triggers associated with substance abuse/mental health issues will improve             -- Long Term Goals: Improvement in symptoms so as ready for discharge   Safety and Monitoring:             -- Voluntary admission to inpatient psychiatric unit for safety, stabilization and treatment             -- Daily contact with patient to assess and evaluate symptoms and progress in treatment             -- Patient's case to be discussed in multi-disciplinary team meeting             -- Observation Level : q15 minute checks             -- Vital signs:  q12 hours             -- Precautions: suicide, elopement, and assault  Discharge Planning:              -- Social work and case management to assist with discharge planning and identification of hospital follow-up needs prior to discharge             -- Estimated LOS: 4-6 more days             -- Discharge Concerns: Need to establish a safety plan;  Medication compliance and effectiveness             -- Discharge Goals: Return home with outpatient referrals for mental health follow-up including medication management/psychotherapy   Felicia GORMAN Rosser, DO 10/06/2024, 12:50 PM

## 2024-10-06 NOTE — Group Note (Signed)
 Date:  10/06/2024 Time:  11:17 AM  Group Topic/Focus: Goals and orientation Goals Group:   The focus of this group is to help patients establish daily goals to achieve during treatment and discuss how the patient can incorporate goal setting into their daily lives to aide in recovery. Orientation:   The focus of this group is to educate the patient on the purpose and policies of crisis stabilization and provide a format to answer questions about their admission.  The group details unit policies and expectations of patients while admitted.    Participation Level:  Did Not Attend   Felicia Sutton 10/06/2024, 11:17 AM

## 2024-10-06 NOTE — [facility] Group Notes (Signed)
 [facility] Group Notes:  (Nursing/MHT/Case Management/Adjunct)  Date:  10/06/2024  Time:  9:00 PM  Type of Therapy:  Group Therapy  Participation Level:  Active  Participation Quality:  Appropriate  Affect:  Appropriate  Cognitive:  Appropriate  Insight:  Appropriate  Engagement in Group:  Supportive  Modes of Intervention:  Socialization and Support  Summary of Progress/Problems:Pt attended NA  Felicia Sutton 10/06/2024, 9:00 PM

## 2024-10-06 NOTE — BH IP Treatment Plan (Signed)
 Interdisciplinary Treatment and Diagnostic Plan Update  10/06/2024 Time of Session: 10:00 AM Felicia Sutton MRN: [id number]  Principal Diagnosis: MDD (major depressive disorder), recurrent, severe, with psychosis (HCC)  Secondary Diagnoses: Principal Problem:   MDD (major depressive disorder), recurrent, severe, with psychosis (HCC)   Current Medications:  Current Facility-Administered Medications  Medication Dose Route Frequency Provider Last Rate Last Admin   acetaminophen  (TYLENOL ) tablet 650 mg  650 mg Oral Q6H PRN Coleman, Carolyn H, NP   650 mg at 10/05/24 2141   alum & mag hydroxide-simeth (MAALOX/MYLANTA) 200-200-20 MG/5ML suspension 30 mL  30 mL Oral Q4H PRN Coleman, Carolyn H, NP       ARIPiprazole  (ABILIFY ) tablet 5 mg  5 mg Oral Daily Pashayan, Alexander S, DO   5 mg at 10/06/24 9162   haloperidol  (HALDOL ) tablet 5 mg  5 mg Oral TID PRN Mardy Elveria DEL, NP       And   diphenhydrAMINE  (BENADRYL ) capsule 50 mg  50 mg Oral TID PRN Mardy Elveria DEL, NP       haloperidol  lactate (HALDOL ) injection 5 mg  5 mg Intramuscular TID PRN Mardy Elveria DEL, NP       And   diphenhydrAMINE  (BENADRYL ) injection 50 mg  50 mg Intramuscular TID PRN Mardy Elveria DEL, NP       And   LORazepam  (ATIVAN ) injection 2 mg  2 mg Intramuscular TID PRN Coleman, Carolyn H, NP       haloperidol  lactate (HALDOL ) injection 10 mg  10 mg Intramuscular TID PRN Mardy Elveria DEL, NP       And   diphenhydrAMINE  (BENADRYL ) injection 50 mg  50 mg Intramuscular TID PRN Mardy Elveria DEL, NP       And   LORazepam  (ATIVAN ) injection 2 mg  2 mg Intramuscular TID PRN Coleman, Carolyn H, NP       hydrOXYzine  (ATARAX ) tablet 25 mg  25 mg Oral TID PRN Coleman, Carolyn H, NP   25 mg at 10/06/24 1011   magnesium  hydroxide (MILK OF MAGNESIA) suspension 30 mL  30 mL Oral Daily PRN Coleman, Carolyn H, NP       ondansetron  (ZOFRAN -ODT) disintegrating tablet 4 mg  4 mg Oral Q8H PRN Coleman, Carolyn H, NP        propranolol  (INDERAL ) tablet 10 mg  10 mg Oral BID Pashayan, Alexander S, DO       traZODone  (DESYREL ) tablet 50 mg  50 mg Oral QHS PRN Coleman, Carolyn H, NP   50 mg at 10/05/24 2103   PTA Medications: Medications Prior to Admission  Medication Sig Dispense Refill Last Dose/Taking   dicyclomine  (BENTYL ) 20 MG tablet Take 1 tablet (20 mg total) by mouth 2 (two) times daily. (Patient not taking: Reported on 10/04/2024) 20 tablet 0    ibuprofen  (ADVIL ) 400 MG tablet Take 1 tablet (400 mg total) by mouth 4 (four) times daily for 5 days. (Patient not taking: Reported on 10/04/2024) 30 tablet 0    oxyCODONE -acetaminophen  (PERCOCET) 5-325 MG tablet Take 1 tablet by mouth every 8 (eight) hours as needed for severe pain (pain score 7-10). (Patient not taking: Reported on 10/04/2024) 10 tablet 0     Patient Stressors:    Patient Strengths:    Treatment Modalities: Medication Management, Group therapy, Case management,  1 to 1 session with clinician, Psychoeducation, Recreational therapy.   Physician Treatment Plan for Primary Diagnosis: MDD (major depressive disorder), recurrent, severe, with psychosis (HCC) Long Term Goal(s): Improvement in symptoms  so as ready for discharge   Short Term Goals: Ability to identify changes in lifestyle to reduce recurrence of condition will improve Ability to verbalize feelings will improve Ability to disclose and discuss suicidal ideas Ability to identify and develop effective coping behaviors will improve Ability to identify triggers associated with substance abuse/mental health issues will improve  Medication Management: Evaluate patient's response, side effects, and tolerance of medication regimen.  Therapeutic Interventions: 1 to 1 sessions, Unit Group sessions and Medication administration.  Evaluation of Outcomes: Not Progressing  Physician Treatment Plan for Secondary Diagnosis: Principal Problem:   MDD (major depressive disorder), recurrent, severe,  with psychosis (HCC)  Long Term Goal(s): Improvement in symptoms so as ready for discharge   Short Term Goals: Ability to identify changes in lifestyle to reduce recurrence of condition will improve Ability to verbalize feelings will improve Ability to disclose and discuss suicidal ideas Ability to identify and develop effective coping behaviors will improve Ability to identify triggers associated with substance abuse/mental health issues will improve     Medication Management: Evaluate patient's response, side effects, and tolerance of medication regimen.  Therapeutic Interventions: 1 to 1 sessions, Unit Group sessions and Medication administration.  Evaluation of Outcomes: Not Progressing   RN Treatment Plan for Primary Diagnosis: MDD (major depressive disorder), recurrent, severe, with psychosis (HCC) Long Term Goal(s): Knowledge of disease and therapeutic regimen to maintain health will improve  Short Term Goals: Ability to remain free from injury will improve, Ability to verbalize frustration and anger appropriately will improve, Ability to demonstrate self-control, Ability to participate in decision making will improve, Ability to verbalize feelings will improve, Ability to disclose and discuss suicidal ideas, Ability to identify and develop effective coping behaviors will improve, and Compliance with prescribed medications will improve  Medication Management: RN will administer medications as ordered by provider, will assess and evaluate patient's response and provide education to patient for prescribed medication. RN will report any adverse and/or side effects to prescribing provider.  Therapeutic Interventions: 1 on 1 counseling sessions, Psychoeducation, Medication administration, Evaluate responses to treatment, Monitor vital signs and CBGs as ordered, Perform/monitor CIWA, COWS, AIMS and Fall Risk screenings as ordered, Perform wound care treatments as ordered.  Evaluation of  Outcomes: Not Progressing   LCSW Treatment Plan for Primary Diagnosis: MDD (major depressive disorder), recurrent, severe, with psychosis (HCC) Long Term Goal(s): Safe transition to appropriate next level of care at discharge, Engage patient in therapeutic group addressing interpersonal concerns.  Short Term Goals: Engage patient in aftercare planning with referrals and resources, Increase social support, Increase ability to appropriately verbalize feelings, Increase emotional regulation, Facilitate acceptance of mental health diagnosis and concerns, Facilitate patient progression through stages of change regarding substance use diagnoses and concerns, Identify triggers associated with mental health/substance abuse issues, and Increase skills for wellness and recovery  Therapeutic Interventions: Assess for all discharge needs, 1 to 1 time with Social worker, Explore available resources and support systems, Assess for adequacy in community support network, Educate family and significant other(s) on suicide prevention, Complete Psychosocial Assessment, Interpersonal group therapy.  Evaluation of Outcomes: Not Progressing   Progress in Treatment: Attending groups: Yes. Participating in groups: Yes. Taking medication as prescribed: Yes. Toleration medication: Yes. Family/Significant other contact made: No, will contact:  Medford Sauers, husband, [phone], Date and time of first attempt:10/05/2024 at 11:00 AM Patient understands diagnosis: Yes. Discussing patient identified problems/goals with staff: Yes. Medical problems stabilized or resolved: Yes. Denies suicidal/homicidal ideation: Yes. Issues/concerns per patient self-inventory: No.  New problem(s) identified:  No  New Short Term/Long Term Goal(s):     medication stabilization, elimination of SI thoughts, development of comprehensive mental wellness plan.    Patient Goals:  I want to get better.  I want to fix my medications and  go to groups.  Discharge Plan or Barriers:  Patient recently admitted. CSW will continue to follow and assess for appropriate referrals and possible discharge planning.    Reason for Continuation of Hospitalization: Anxiety Depression Hallucinations Medication stabilization  Estimated Length of Stay:  5 - 7 days  Last 3 [place] Suicide Severity Risk Score: Flowsheet Row [facility] (Current[facility] Most recent reading at 10/04/2024  9:00 PM ED from 10/04/2024 in [facility] Emergency Department at [facility] Most recent reading at 10/04/2024  7:55 AM ED from 10/01/2024 in [facility] Emergency Department at [facility] Most recent reading at 10/01/2024 12:11 AM  C-SSRS RISK CATEGORY High Risk High Risk No Risk    Last PHQ 2/9 Scores:     No data to display          Scribe for Treatment Team: Zayne Marovich O Tanay Misuraca, LCSW 10/06/2024 4:56 PM

## 2024-10-06 NOTE — Progress Notes (Signed)
(  Sleep Hours) -8.5 as of 0530 (Any PRNs that were needed, meds refused, or side effects to meds)- prn hydroxyzine  and trazodone  @ 2103, prn tylenol  for headache @ 2141 (Any disturbances and when (visitation, over night)-none (Concerns raised by the patient)- none (SI/HI/AVH)- denies all

## 2024-10-06 NOTE — Group Note (Signed)
 Date:  10/06/2024 Time:  11:37 AM  Group Topic/Focus: Recreational therapy Word Scramble is a recreational therapy activity that helps adults with mental health conditions improve critical thinking skills. In this activity, participants rearrange scrambled letters to form meaningful words, which encourages problem-solving, decision-making, and concentration. Word Scramble also helps improve attention and memory while reducing stress in a fun and supportive environment. By working individually or in groups, participants practice logical thinking and communication skills, making this activity an effective and engaging tool in recreational therapy.    Participation Level:  Active   Dolores HERO Elgin Carn 10/06/2024, 11:37 AM

## 2024-10-06 NOTE — Plan of Care (Signed)
 Pt was out in the milieu during the day acting appropriately. Went to [employer] and ate adequately. Attending group activity and participated. Denies SI/HI/SH/paranoia/VH. Still endorsing AH, hearing voices that are talking about random things, not commanding nor demeaning. Will continue to monitor.

## 2024-10-06 NOTE — Progress Notes (Signed)
(  Sleep Hours) - (Any PRNs that were needed, meds refused, or side effects to meds)- PRN Trazodone  and PRN Hydroxyzine  (Any disturbances and when (visitation, over night)- None (Concerns raised by the patient)- None (SI/HI/AVH)- Denies. Contracts for safety

## 2024-10-07 DIAGNOSIS — F333 Major depressive disorder, recurrent, severe with psychotic symptoms: Secondary | ICD-10-CM | POA: Diagnosis not present

## 2024-10-07 LAB — LIPID PANEL
Cholesterol: 147 mg/dL (ref 0–200)
HDL: 44 mg/dL
LDL Cholesterol: 83 mg/dL (ref 0–99)
Total CHOL/HDL Ratio: 3.3 ratio
Triglycerides: 97 mg/dL
VLDL: 19 mg/dL (ref 0–40)

## 2024-10-07 LAB — HEMOGLOBIN A1C
Hgb A1c MFr Bld: 5.3 % (ref 4.8–5.6)
Mean Plasma Glucose: 105.41 mg/dL

## 2024-10-07 MED ORDER — HALOPERIDOL 2 MG PO TABS
2.0000 mg | ORAL_TABLET | Freq: Once | ORAL | Status: AC
  Administered 2024-10-07: 2 mg via ORAL
  Filled 2024-10-07: qty 1

## 2024-10-07 MED ORDER — TRAZODONE HCL 100 MG PO TABS
100.0000 mg | ORAL_TABLET | Freq: Every evening | ORAL | Status: DC | PRN
  Administered 2024-10-07 – 2024-10-10 (×4): 100 mg via ORAL
  Filled 2024-10-07 (×4): qty 1

## 2024-10-07 MED ORDER — ARIPIPRAZOLE 5 MG PO TABS
5.0000 mg | ORAL_TABLET | Freq: Once | ORAL | Status: AC
  Administered 2024-10-07: 5 mg via ORAL
  Filled 2024-10-07: qty 1

## 2024-10-07 NOTE — Progress Notes (Addendum)
 [facility] MD Progress Note  10/07/2024 10:59 AM Felicia Sutton  MRN:  [id number] Subjective:   Felicia Sutton is a 44 yr old female who presented on 1/14 to [facility] with SI due to worsening depression and AH, she was admitted to [facility] on 1/15.  PPHx is significant for MDD, a history of Polysubstance Abuse (THC, Cocaine, EtOH, IV Heroine), 1 Suicide Attempt (OD 10/2021), and 3 Prior Psychiatric Hospitalizations (last-[facility] 04/20[facility]), and no history of Self Injurious Behavior.    Case was discussed in the multidisciplinary team. MAR was reviewed and patient was compliant with medications.  She received PRN Hydroxyzine  x2 and Trazodone  yesterday.   Psychiatric Team made the following recommendations yesterday: -Start Propanolol 10 mg BID for anxiety -Increase Abilify  to 5 mg for psychosis and depression   On interview today patient reports she slept fair last night.  She reports her appetite is doing good.  She reports no SI, HI, or VH.  She reports continued AH.  She reports no Paranoia or Ideas of Reference.  She reports no issues with her medications.  She does report some improvement in anxiety since starting the propranolol  yesterday.  She reports that the voices continue to be significant and distressing.  Discussed increasing her Haldol  and she was agreeable with this.  She reports some continued pain in her right lower quadrant.  She reports no other concerns at present.  Update: She later left group reporting worsening AH.   Principal Problem: MDD (major depressive disorder), recurrent, severe, with psychosis (HCC) Diagnosis: Principal Problem:   MDD (major depressive disorder), recurrent, severe, with psychosis (HCC)  Total Time spent with patient:  I personally spent 35 minutes on the unit in direct patient care. The direct patient care time included face-to-face time with the patient, reviewing the patient's chart, communicating with other professionals, and coordinating care.    Past  Psychiatric History:  MDD, a history of Polysubstance Abuse (THC, Cocaine, EtOH, IV Heroine), 1 Suicide Attempt (OD 10/2021), and 3 Prior Psychiatric Hospitalizations (last-[facility] 04/2023), and no history of Self Injurious Behavior.   Past Medical History:  Past Medical History:  Diagnosis Date   GERD (gastroesophageal reflux disease) 2017   Major depression 12/2019    Past Surgical History:  Procedure Laterality Date   CHOLECYSTECTOMY  01/2019   ESOPHAGOGASTRODUODENOSCOPY (EGD) WITH PROPOFOL  N/A 02/01/2020   Procedure: ESOPHAGOGASTRODUODENOSCOPY (EGD) WITH PROPOFOL ;  Surgeon: Legrand Victory LITTIE DOUGLAS, MD;  Location: [facility] ENDOSCOPY;  Service: Gastroenterology;  Laterality: N/A;   Family History: History reviewed. No pertinent family history. Family Psychiatric  History:  Reports No Known Diagnosis', Suicides, or Substance Abuse   Social History:  Social History   Substance and Sexual Activity  Alcohol Use Not Currently     Social History   Substance and Sexual Activity  Drug Use Not Currently    Social History   Socioeconomic History   Marital status: Single    Spouse name: Not on file   Number of children: Not on file   Years of education: Not on file   Highest education level: Not on file  Occupational History   Not on file  Tobacco Use   Smoking status: Never    Passive exposure: Never   Smokeless tobacco: Never  Vaping Use   Vaping status: Never Used  Substance and Sexual Activity   Alcohol use: Not Currently   Drug use: Not Currently   Sexual activity: Not Currently  Other Topics Concern   Not on file  Social History Narrative   ** Merged History Encounter **       Social Drivers of Health   Tobacco Use: Low Risk (10/04/2024)   Patient History    Smoking Tobacco Use: Never    Smokeless Tobacco Use: Never    Passive Exposure: Never  Financial Resource Strain: Not on file  Food Insecurity: No Food Insecurity (10/04/2024)   Epic    Worried About Patent Examiner in the Last Year: Never true    Ran Out of Food in the Last Year: Never true  Transportation Needs: No Transportation Needs (10/04/2024)   Epic    Lack of Transportation (Medical): No    Lack of Transportation (Non-Medical): No  Physical Activity: Not on file  Stress: Not on file  Social Connections: Unknown (01/25/2022)   Received from [facility]   Social Network    Social Network: Not on file  Depression [phone]): Not on file  Alcohol Screen: Low Risk (10/04/2024)   Alcohol Screen    Last Alcohol Screening Score (AUDIT): 0  Housing: Low Risk (10/04/2024)   Epic    Unable to Pay for Housing in the Last Year: No    Number of Times Moved in the Last Year: 0    Homeless in the Last Year: No  Utilities: Not At Risk (10/04/2024)   Epic    Threatened with loss of utilities: No  Health Literacy: Not on file   Additional Social History:                         Sleep: Fair Estimated Sleeping Duration (Last 24 Hours): 6.25-6.50 hours  Appetite:  Good  Current Medications: Current Facility-Administered Medications  Medication Dose Route Frequency Provider Last Rate Last Admin   acetaminophen  (TYLENOL ) tablet 650 mg  650 mg Oral Q6H PRN Coleman, Carolyn H, NP   650 mg at 10/05/24 2141   alum & mag hydroxide-simeth (MAALOX/MYLANTA) 200-200-20 MG/5ML suspension 30 mL  30 mL Oral Q4H PRN Coleman, Carolyn H, NP       ARIPiprazole  (ABILIFY ) tablet 5 mg  5 mg Oral Daily Lema Heinkel S, DO   5 mg at 10/07/24 9183   haloperidol  (HALDOL ) tablet 5 mg  5 mg Oral TID PRN Mardy Elveria DEL, NP       And   diphenhydrAMINE  (BENADRYL ) capsule 50 mg  50 mg Oral TID PRN Mardy Elveria DEL, NP       haloperidol  lactate (HALDOL ) injection 5 mg  5 mg Intramuscular TID PRN Mardy Elveria DEL, NP       And   diphenhydrAMINE  (BENADRYL ) injection 50 mg  50 mg Intramuscular TID PRN Mardy Elveria DEL, NP       And   LORazepam  (ATIVAN ) injection 2 mg  2 mg Intramuscular TID PRN  Mardy Elveria DEL, NP       haloperidol  lactate (HALDOL ) injection 10 mg  10 mg Intramuscular TID PRN Mardy Elveria DEL, NP       And   diphenhydrAMINE  (BENADRYL ) injection 50 mg  50 mg Intramuscular TID PRN Mardy Elveria DEL, NP       And   LORazepam  (ATIVAN ) injection 2 mg  2 mg Intramuscular TID PRN Coleman, Carolyn H, NP       haloperidol  (HALDOL ) tablet 2 mg  2 mg Oral Once Marque Rademaker S, DO       hydrOXYzine  (ATARAX ) tablet 25 mg  25 mg Oral TID PRN Mardy,  Elveria DEL, NP   25 mg at 10/07/24 9371   magnesium  hydroxide (MILK OF MAGNESIA) suspension 30 mL  30 mL Oral Daily PRN Coleman, Carolyn H, NP       ondansetron  (ZOFRAN -ODT) disintegrating tablet 4 mg  4 mg Oral Q8H PRN Coleman, Carolyn H, NP       propranolol  (INDERAL ) tablet 10 mg  10 mg Oral BID Elisa Kutner S, DO   10 mg at 10/07/24 9183   traZODone  (DESYREL ) tablet 100 mg  100 mg Oral QHS PRN Raliegh Marsa RAMAN, DO        Lab Results:  Results for orders placed or performed during the hospital encounter of 10/04/24 (from the past 48 hours)  Lipid panel     Status: None   Collection Time: 10/07/24  6:21 AM  Result Value Ref Range   Cholesterol 147 0 - 200 mg/dL    Comment:        ATP III CLASSIFICATION:  <200     mg/dL   Desirable  799-760  mg/dL   Borderline High  >=759    mg/dL   High           Triglycerides 97 <150 mg/dL   HDL 44 >59 mg/dL   Total CHOL/HDL Ratio 3.3 RATIO   VLDL 19 0 - 40 mg/dL   LDL Cholesterol 83 0 - 99 mg/dL    Comment:        Total Cholesterol/HDL:CHD Risk Coronary Heart Disease Risk Table                     Men   Women  1/2 Average Risk   3.4   3.3  Average Risk       5.0   4.4  2 X Average Risk   9.6   7.1  3 X Average Risk  23.4   11.0        Use the calculated Patient Ratio above and the CHD Risk Table to determine the patient's CHD Risk.        ATP III CLASSIFICATION (LDL):  <100     mg/dL   Optimal  899-870  mg/dL   Near or Above                     Optimal  130-159  mg/dL   Borderline  839-810  mg/dL   High  >809     mg/dL   Very High Performed at [facility], [address], [place], [place] [address]   Hemoglobin A1c     Status: None   Collection Time: 10/07/24  6:21 AM  Result Value Ref Range   Hgb A1c MFr Bld 5.3 4.8 - 5.6 %    Comment: (NOTE) Diagnosis of Diabetes The following HbA1c ranges recommended by the American Diabetes Association (ADA) may be used as an aid in the diagnosis of diabetes mellitus.  Hemoglobin             Suggested A1C NGSP%              Diagnosis  <5.7                   Non Diabetic  5.7-6.4                Pre-Diabetic  >6.4                   Diabetic  <7.0  Glycemic control for                       adults with diabetes.     Mean Plasma Glucose 105.41 mg/dL    Comment: Performed at [facility], [address], [place], [place] [address]    Blood Alcohol level:  Lab Results  Component Value Date   ETH <10 11/29/2021   ETH <10 07/05/2021    Metabolic Disorder Labs: Lab Results  Component Value Date   HGBA1C 5.3 10/07/2024   MPG 105.41 10/07/2024   MPG 108 12/02/2021   No results found for: PROLACTIN Lab Results  Component Value Date   CHOL 147 10/07/2024   TRIG 97 10/07/2024   HDL 44 10/07/2024   CHOLHDL 3.3 10/07/2024   VLDL 19 10/07/2024   LDLCALC 83 10/07/2024   LDLCALC 84 12/02/2021    Physical Findings: AIMS:  ,  ,  ,  ,  ,  ,   CIWA:    COWS:     Musculoskeletal: Strength & Muscle Tone: within normal limits Gait & Station: normal Patient leans: N/A  Psychiatric Specialty Exam:  Presentation  General Appearance:  Appropriate for Environment; Casual  Eye Contact: Fair  Speech: Clear and Coherent; Normal Rate  Speech Volume: Normal  Handedness: Right   Mood and Affect  Mood: Anxious; Depressed  Affect: Constricted; Depressed; Congruent   Thought Process  Thought Processes: Coherent; Goal  Directed  Descriptions of Associations:Intact  Orientation:Full (Time, Place and Person)  Thought Content:Logical; WDL  History of Schizophrenia/Schizoaffective disorder:No data recorded Duration of Psychotic Symptoms:No data recorded Hallucinations:Hallucinations: Auditory Description of Auditory Hallucinations: telling her negative things  Ideas of Reference:None  Suicidal Thoughts:Suicidal Thoughts: No  Homicidal Thoughts:Homicidal Thoughts: No   Sensorium  Memory: Immediate Fair; Recent Fair  Judgment: Intact  Insight: Present   Executive Functions  Concentration: Fair  Attention Span: Fair  Recall: [employer] of Knowledge: Fair  Language: Fair   Psychomotor Activity  Psychomotor Activity: Psychomotor Activity: Normal   Assets  Assets: Resilience   Sleep  Sleep: Sleep: Fair    Physical Exam: Physical Exam Vitals and nursing note reviewed.  Constitutional:      General: She is not in acute distress.    Appearance: Normal appearance. She is obese. She is not ill-appearing or toxic-appearing.  HENT:     Head: Normocephalic and atraumatic.  Pulmonary:     Effort: Pulmonary effort is normal.  Abdominal:     Comments: Right lower quadrant pain  Musculoskeletal:        General: Normal range of motion.  Neurological:     General: No focal deficit present.     Mental Status: She is alert.    Review of Systems  Respiratory:  Negative for cough and shortness of breath.   Cardiovascular:  Negative for chest pain.  Gastrointestinal:  Negative for abdominal pain, constipation, diarrhea, nausea and vomiting.  Neurological:  Negative for dizziness, weakness and headaches.  Psychiatric/Behavioral:  Positive for depression and hallucinations (AH). Negative for suicidal ideas. The patient is nervous/anxious.    Blood pressure 116/76, pulse 75, temperature 98 F (36.7 C), resp. rate 18, height 5' 2 (1.575 m), weight 98.3 kg, last menstrual  period 10/01/2024, SpO2 100%. Body mass index is 39.65 kg/m.   Treatment Plan Summary: Daily contact with patient to assess and evaluate symptoms and progress in treatment and Medication management  Tarri Guilfoil is a 44 yr old female  who presented on 1/14 to [facility] with SI due to worsening depression and AH, she was admitted to [facility] on 1/15.  PPHx is significant for MDD, a history of Polysubstance Abuse (THC, Cocaine, EtOH, IV Heroine), 1 Suicide Attempt (OD 10/2021), and 3 Prior Psychiatric Hospitalizations (last-[facility] 04/20[facility]), and no history of Self Injurious Behavior.    Peyten reports some mild improvement in anxiety with starting the propranolol .  She continues to have significant AH so we will increase her Abilify .  She is reporting return of some right lower quadrant pain which was worked up in the ED on 1/11 with negative CT and negative ultrasound.  Encouraged her to use as needed Tylenol .  We will continue to monitor.  Update: She did leave group early reporting intensified AH and anxiety so we will give a one-time dose of Haldol .   MDD, Recurrent, Severe, w/ Psychotic Features: -Continue Propanolol 10 mg BID for anxiety -Increase Abilify  to 10 mg for psychosis and depression -Continue Agitation Protocol: Haldol /Ativan /Benadryl    -One time dose Haldol  2 mg oral   -Continue PRN's: Tylenol , Maalox, Atarax , Milk of Magnesia, Trazodone    --  The risks/benefits/side-effects/alternatives to medications were discussed in detail with the patient and time was given for questions. The patient consents to medication trials.                -- Metabolic profile and EKG monitoring obtained while on an atypical antipsychotic (BMI: 39.65, Lipid Panel: Ordered, HbgA1c: Ordered, EKG: NSR w/QTc: 403)              -- Encouraged patient to participate in unit milieu and in scheduled group therapies              -- Short Term Goals: Ability to identify changes in lifestyle to reduce recurrence  of condition will improve, Ability to verbalize feelings will improve, Ability to disclose and discuss suicidal ideas, Ability to demonstrate self-control will improve, Ability to identify and develop effective coping behaviors will improve, Ability to maintain clinical measurements within normal limits will improve, Compliance with prescribed medications will improve, and Ability to identify triggers associated with substance abuse/mental health issues will improve             -- Long Term Goals: Improvement in symptoms so as ready for discharge   Safety and Monitoring:             -- Voluntary admission to inpatient psychiatric unit for safety, stabilization and treatment             -- Daily contact with patient to assess and evaluate symptoms and progress in treatment             -- Patient's case to be discussed in multi-disciplinary team meeting             -- Observation Level : q15 minute checks             -- Vital signs:  q12 hours             -- Precautions: suicide, elopement, and assault  Discharge Planning:              -- Social work and case management to assist with discharge planning and identification of hospital follow-up needs prior to discharge             -- Estimated LOS: 4-6 more days             -- Discharge Concerns: Need to  establish a safety plan; Medication compliance and effectiveness             -- Discharge Goals: Return home with outpatient referrals for mental health follow-up including medication management/psychotherapy   Marsa GORMAN Rosser, DO 10/07/2024, 10:59 AM

## 2024-10-07 NOTE — Plan of Care (Signed)
 ?  Problem: Education: ?Goal: Mental status will improve ?Outcome: Progressing ?Goal: Verbalization of understanding the information provided will improve ?Outcome: Progressing ?  ?

## 2024-10-07 NOTE — Group Note (Signed)
 Date:  10/07/2024 Time:  4:52 PM  Group Topic/Focus:  Group Topic: Physical Wellness  Description: Group focused on physical wellness and its impact on mental health. Education was provided on healthy habits including sleep, nutrition, hydration, exercise, and self-care. Participants were encouraged to discuss how physical health affects mood, stress levels, and overall functioning. Engagement and participation were appropriate.   Participation Level:  Active   April Carlyon 10/07/2024, 4:52 PM

## 2024-10-07 NOTE — Progress Notes (Signed)
" °   10/07/24 2319  Psych Admission Type (Psych Patients Only)  Admission Status Voluntary  Psychosocial Assessment  Patient Complaints Anxiety  Eye Contact Fair  Facial Expression Animated  Affect Appropriate to circumstance  Speech Logical/coherent  Interaction Assertive  Motor Activity Other (Comment) (WDL)  Appearance/Hygiene Unremarkable  Behavior Characteristics Appropriate to situation  Mood Pleasant  Thought Process  Coherency WDL  Content WDL  Delusions None reported or observed  Perception Hallucinations  Hallucination Auditory  Judgment Impaired  Confusion None  Danger to Self  Current suicidal ideation? Denies  Agreement Not to Harm Self Yes  Description of Agreement verbal  Danger to Others  Danger to Others None reported or observed    "

## 2024-10-07 NOTE — Group Note (Signed)
 Date:  10/07/2024 Time:  3:01 PM  Group Topic/Focus:  Goals Group:   The focus of this group is to help patients establish daily goals to achieve during treatment and discuss how the patient can incorporate goal setting into their daily lives to aide in recovery. Orientation:   The focus of this group is to educate the patient on the purpose and policies of crisis stabilization and provide a format to answer questions about their admission.  The group details unit policies and expectations of patients while admitted.    Participation Level:  Active  Participation Quality:  Appropriate  Affect:  Appropriate  Cognitive:  Appropriate  Insight: Appropriate  Engagement in Group:  Engaged  Modes of Intervention:  Activity  Additional Comments:  Pt attended group and participated.   Sabrie Moritz 10/07/2024, 3:01 PM

## 2024-10-07 NOTE — Group Note (Signed)
 Date:  10/07/2024 Time:  5:50 PM  Group Topic/Focus:  Group used a non- competitive card game to build mindful listening skills and encourage acceptance and understanding oneself and peers. Patients share, foster empathy, and personal growth in a supportive environment.    Participation Level:  Active  Participation Quality:  Attentive  Affect:  Appropriate  Cognitive:  Alert  Insight: Appropriate  Engagement in Group:  Engaged  Modes of Intervention:  Activity, Rapport Building, Socialization, and Support    Felicia Sutton [name] 10/07/2024, 5:50 PM

## 2024-10-07 NOTE — Group Note (Unsigned)
 Date:  10/07/2024 Time:  5:59 PM  Group Topic/Focus:  Group used a non- competitive card game to build mindful listening skills and encouraging acceptance and understanding of oneself and peers.     Participation Level:  {B[facility] PARTICIPATION OZCZO:77735}  Participation Quality:  {B[facility] PARTICIPATION QUALITY:22265}  Affect:  {BHH AFFECT:22266}  Cognitive:  {B[facility] COGNITIVE:22267}  Insight: {BHH Insight2:20797}  Engagement in Group:  {BHH ENGAGEMENT IN HMNLE:77731}  Modes of Intervention:  {BHH MODES OF INTERVENTION:22269}  Additional Comments:  ***  Inocente PARAS Azura Tufaro 10/07/2024, 5:59 PM

## 2024-10-07 NOTE — [facility] Group Notes (Signed)
"          [facility] LCSW Group Therapy Note  Date/Time:  10/07/2024 10:10am-11:10am  Type of Therapy and Topic:  Group Therapy:  Healthy and Unhealthy Supports  Participation Level:  Minimal   Description of Group:  Patients in this group explored the differences between healthy and unhealthy supports.  Patients identified their current healthy supports as well as supports they wished they had but do not currently have.  Psychoeducation was provided about needing to be part of our own support system and there was a discussion about how important it is to participate in self-support in order to have ownership in progress that is made.  Some patients were opposed to the idea of reducing contact with unhealthy supports and this led to a healthy group discussion.  A demonstration was given about how to set boundaries with family and/or friends, which patients expressed was beneficial.    Therapeutic Goals:   1)  compare healthy versus unhealthy supports and identify individuals' current healthy and unhealthy supports 2)  identify additional examples of each and demonstrate commonalities within the group  3)  discuss importance of adding healthy supports and setting boundaries with unhealthy supports  5)  offer mutual support about how to address unhealthy supports  6)  encourage active participation in group    Summary of Patient Progress:  The patient expressed that her husband is a positive support and she has no unhealthy supports in her life that she wishes she could get more support from.  She was in and out of the room and did not share during group.   Therapeutic Modalities:   Psychoeducation Processing  Elgie Crest, LCSW       "

## 2024-10-07 NOTE — Group Note (Signed)
 Date:  10/07/2024 Time:  5:24 PM  Group Topic/Focus:  Wellness Toolbox:   The focus of this group is to discuss various aspects of wellness, balancing those aspects and exploring ways to increase the ability to experience wellness.  Patients will create a wellness toolbox for use upon discharge.  The nurse played a game with the patients that promotes intellectual wellness.     Participation Level:  Active    Felicia Sutton 10/07/2024, 5:24 PM

## 2024-10-07 NOTE — Progress Notes (Signed)
" °   10/07/24 1000  Psych Admission Type (Psych Patients Only)  Admission Status Voluntary  Psychosocial Assessment  Patient Complaints Sleep disturbance;Anxiety  Eye Contact Fair  Facial Expression Flat  Affect Appropriate to circumstance  Speech Logical/coherent  Interaction Assertive  Motor Activity Other (Comment) (steady gait)  Appearance/Hygiene Unremarkable  Behavior Characteristics Cooperative;Anxious  Mood Anxious;Pleasant  Aggressive Behavior  Effect No apparent injury  Thought Process  Coherency WDL  Content WDL  Delusions None reported or observed  Perception Hallucinations  Hallucination Auditory  Judgment Impaired  Confusion None  Danger to Self  Current suicidal ideation? Denies  Agreement Not to Harm Self Yes  Description of Agreement agreed to contact staff before acting on harmful thoughts  Danger to Others  Danger to Others None reported or observed    "

## 2024-10-07 NOTE — Progress Notes (Addendum)
 SABRA

## 2024-10-07 NOTE — Group Note (Signed)
 Date:  10/07/2024 Time:  4:26 PM  Group Topic/Focus:  Emotional Education:   The focus of this group is to discuss what feelings/emotions are, and how they are experienced. Wellness Toolbox:   The focus of this group is to discuss various aspects of wellness, balancing those aspects and exploring ways to increase the ability to experience wellness.  Patients will create a wellness toolbox for use upon discharge.    Participation Level:  Active  Participation Quality:  Appropriate  Affect:  Appropriate  Cognitive:  Appropriate  Insight: Appropriate  Engagement in Group:  Engaged  Modes of Intervention:  Activity  Additional Comments:  Pt attended and participated.  Traci Plemons 10/07/2024, 4:26 PM

## 2024-10-07 NOTE — Progress Notes (Signed)
 Patient briefly left group led by SW to request to speak with RN. Pt reported to this RN that her AH have gotten worse over the course of the day, stating that the voices were telling her that she needed to go home- per pt., meaning that she needed to die. Pt returned to group after she was reassured that her provider would be made aware. Pt also reported that she had difficulty falling asleep last night, and was very tired.

## 2024-10-08 DIAGNOSIS — F333 Major depressive disorder, recurrent, severe with psychotic symptoms: Secondary | ICD-10-CM | POA: Diagnosis not present

## 2024-10-08 MED ORDER — ARIPIPRAZOLE 10 MG PO TABS
10.0000 mg | ORAL_TABLET | Freq: Every day | ORAL | Status: DC
  Administered 2024-10-08 – 2024-10-13 (×6): 10 mg via ORAL
  Filled 2024-10-08 (×5): qty 1
  Filled 2024-10-08: qty 7
  Filled 2024-10-08: qty 1

## 2024-10-08 MED ORDER — FLUOXETINE HCL 20 MG PO CAPS
20.0000 mg | ORAL_CAPSULE | Freq: Every day | ORAL | Status: DC
  Administered 2024-10-08 – 2024-10-10 (×3): 20 mg via ORAL
  Filled 2024-10-08 (×3): qty 1

## 2024-10-08 NOTE — Group Note (Signed)
 Date:  10/08/2024 Time:  10:49 AM  Group Topic/Focus:  Dimensions of Wellness:   The focus of this group is to introduce the topic of wellness and discuss the role each dimension of wellness plays in total health. Emotional Education:   The focus of this group is to discuss what feelings/emotions are, and how they are experienced.    Participation Level:  Active  Participation Quality:  Appropriate  Affect:  Appropriate  Cognitive:  Appropriate  Insight: Appropriate  Engagement in Group:  Improving  Modes of Intervention:  Discussion   Alisa Stjames A Malekai Markwood 10/08/2024, 10:49 AM

## 2024-10-08 NOTE — Progress Notes (Signed)
(  Sleep Hours) - 7.5 hours (Any PRNs that were needed, meds refused, or side effects to meds)-  Vistaril  and Trazodone  given (Any disturbances and when (visitation, over night)- None (Concerns raised by the patient)- None (SI/HI/AVH)-  Denies

## 2024-10-08 NOTE — Progress Notes (Signed)
 Pt came to nurses station in tears, states myl father just died, I need to go home.  Expressed condolences to Pt and and gently explained discharge process but assured her doctor would be made aware tomorrow. Pt agreed to take medication to help her calm down.  Pt given PO agitation medication with night meds.  Pt seen in dayroom being consoled by peers.  MHT came later and told this writer that Pt was overheard telling peers that she called her husband and a woman had answered the phone.  Did not speak to Pt about this as  both could be true.  Pt currently in dayroom speaking with peers in no acute distress.  Will continue to monitor.    10/08/24 2139  Psych Admission Type (Psych Patients Only)  Admission Status Voluntary  Psychosocial Assessment  Patient Complaints Anxiety  Eye Contact Fair  Facial Expression Anxious  Affect Appropriate to circumstance  Speech Logical/coherent  Interaction Assertive  Motor Activity Other (Comment) (WDL)  Appearance/Hygiene Unremarkable  Behavior Characteristics Appropriate to situation  Mood Anxious;Pleasant  Thought Process  Coherency WDL  Content WDL  Delusions None reported or observed  Perception Hallucinations  Hallucination None reported or observed  Judgment Impaired  Confusion None  Danger to Self  Current suicidal ideation? Denies  Agreement Not to Harm Self Yes  Description of Agreement verbal  Danger to Others  Danger to Others None reported or observed

## 2024-10-08 NOTE — Group Note (Signed)
 Date:  10/08/2024 Time:  9:09 AM  Group Topic/Focus:  Goals Group:   The focus of this group is to help patients establish daily goals to achieve during treatment and discuss how the patient can incorporate goal setting into their daily lives to aide in recovery.    Participation Level:  Did Not Attend  Lashaun Poch A Rosy Estabrook 10/08/2024, 9:09 AM

## 2024-10-08 NOTE — Group Note (Signed)
 Date:  10/08/2024 Time:  10:20 PM  Group Topic/Focus:  Goals Group:   The focus of this group is to help patients establish daily goals to achieve during treatment and discuss how the patient can incorporate goal setting into their daily lives to aide in recovery. Wrap-Up Group:   The focus of this group is to help patients review their daily goal of treatment and discuss progress on daily workbooks.    Participation Level:  Active  Participation Quality:  Sharing  Affect:  Appropriate  Cognitive:  Appropriate  Insight: Good  Engagement in Group:  Engaged  Modes of Intervention:  Discussion  Additional Comments:    Kristen VEAR Gibbon 10/08/2024, 10:20 PM

## 2024-10-08 NOTE — Progress Notes (Signed)
 D. Upon initial approach this morning at med window- pt reported that the voices were better, but later approached this nurse in distress stating that the voices were back, telling her negative things about herself. Pt reported sleeping better last night, described her appetite and concentration as 'good', and energy level as 'normal'. Per pt's self inventory, pt rated her depression,hopelessness and anxiety all 8/10. Pt currently denies SI/HI and VH. A. Labs and vitals monitored. Pt given and educated on medications. Pt supported emotionally and encouraged to express concerns and ask questions.   R. Pt remains safe with 15 minute checks. Will continue POC.

## 2024-10-08 NOTE — Group Note (Signed)
 Date:  10/08/2024 Time:  4:04 PM  Group Topic/Focus:  Wellness Toolbox:   The focus of this group is to discuss various aspects of wellness, balancing those aspects and exploring ways to increase the ability to experience wellness.  Patients will create a wellness toolbox for use upon discharge.    Participation Level:  Active  Participation Quality:  Appropriate and Attentive  Affect:  Appropriate  Cognitive:  Alert and Appropriate  Insight: Appropriate and Good  Engagement in Group:  Engaged and Supportive  Modes of Intervention:  Discussion and Education    Felicia Sutton Lowers 10/08/2024, 4:04 PM

## 2024-10-08 NOTE — Group Note (Signed)
 Date:  10/08/2024 Time:  6:50 PM  Group Topic/Focus:  Wellness Toolbox:   The focus of this group is to discuss various aspects of wellness, balancing those aspects and exploring ways to increase the ability to experience wellness.  Patients will create a wellness toolbox for use upon discharge.    Participation Level:  Active  Participation Quality:  Attentive  Affect:  Appropriate  Cognitive:  Appropriate  Insight: Appropriate  Engagement in Group:  Engaged  Modes of Intervention:  Discussion and Education  Additional Comments:    Felicia Sutton 10/08/2024, 6:50 PM

## 2024-10-08 NOTE — Group Note (Signed)
 Date:  10/08/2024 Time:  6:00 AM  Group Topic/Focus:  Wrap-Up Group:   The focus of this group is to help patients review their daily goal of treatment and discuss progress on daily workbooks.    Participation Level:  Active  Participation Quality:  Appropriate  Affect:  Appropriate  Cognitive:  Appropriate  Insight: Appropriate  Engagement in Group:  Engaged  Modes of Intervention:  Discussion  Additional Comments:  Pt attended wrap up group and was engaged in discussion  Lyon Dumont A Khylie Larmore 10/08/2024, 6:00 AM

## 2024-10-08 NOTE — Progress Notes (Signed)
 [facility] MD Progress Note  10/08/2024 9:25 AM Felicia Sutton  MRN:  [id number] Subjective:   Felicia Sutton is a 44 yr old female who presented on 1/14 to [facility] with SI due to worsening depression and AH, she was admitted to [facility] on 1/15.  PPHx is significant for MDD, a history of Polysubstance Abuse (THC, Cocaine, EtOH, IV Heroine), 1 Suicide Attempt (OD 10/2021), and 3 Prior Psychiatric Hospitalizations (last-[facility] 04/2023), and no history of Self Injurious Behavior.    Case was discussed in the multidisciplinary team. MAR was reviewed and patient was compliant with medications.  Yesterday she received PRN Hydroxyzine  x2 and Trazodone .   Psychiatric Team made the following recommendations yesterday: -Continue Propanolol 10 mg BID for anxiety -Increase Abilify  to 10 mg for psychosis and depression -One time dose Haldol  2 mg oral   On interview today patient reports she slept fair last night.  She reports her appetite is doing good.  She reports no SI, HI, or VH.  She reports continued AH- voices telling her negative things.  She reports no Paranoia or Ideas of Reference.  She reports no issues with her medications.  She reports some improvement in her anxiety but reports continued significant depression.  Discussed starting an antidepressant with her.  Discussed starting Prozac  and potential risks and side effects and she was agreeable to the trial.  She reports no other concerns at present.   Principal Problem: MDD (major depressive disorder), recurrent, severe, with psychosis (HCC) Diagnosis: Principal Problem:   MDD (major depressive disorder), recurrent, severe, with psychosis (HCC)  Total Time spent with patient:  I personally spent 35 minutes on the unit in direct patient care. The direct patient care time included face-to-face time with the patient, reviewing the patient's chart, communicating with other professionals, and coordinating care.    Past Psychiatric History:  MDD, a  history of Polysubstance Abuse (THC, Cocaine, EtOH, IV Heroine), 1 Suicide Attempt (OD 10/2021), and 3 Prior Psychiatric Hospitalizations (last-[facility] 04/2023), and no history of Self Injurious Behavior.   Past Medical History:  Past Medical History:  Diagnosis Date   GERD (gastroesophageal reflux disease) 2017   Major depression 12/2019    Past Surgical History:  Procedure Laterality Date   CHOLECYSTECTOMY  01/2019   ESOPHAGOGASTRODUODENOSCOPY (EGD) WITH PROPOFOL  N/A 02/01/2020   Procedure: ESOPHAGOGASTRODUODENOSCOPY (EGD) WITH PROPOFOL ;  Surgeon: Legrand Victory LITTIE DOUGLAS, MD;  Location: [facility] ENDOSCOPY;  Service: Gastroenterology;  Laterality: N/A;   Family History: History reviewed. No pertinent family history. Family Psychiatric  History:  Reports No Known Diagnosis', Suicides, or Substance Abuse   Social History:  Social History   Substance and Sexual Activity  Alcohol Use Not Currently     Social History   Substance and Sexual Activity  Drug Use Not Currently    Social History   Socioeconomic History   Marital status: Single    Spouse name: Not on file   Number of children: Not on file   Years of education: Not on file   Highest education level: Not on file  Occupational History   Not on file  Tobacco Use   Smoking status: Never    Passive exposure: Never   Smokeless tobacco: Never  Vaping Use   Vaping status: Never Used  Substance and Sexual Activity   Alcohol use: Not Currently   Drug use: Not Currently   Sexual activity: Not Currently  Other Topics Concern   Not on file  Social History Narrative   ** Merged  History Encounter **       Social Drivers of Health   Tobacco Use: Low Risk (10/04/2024)   Patient History    Smoking Tobacco Use: Never    Smokeless Tobacco Use: Never    Passive Exposure: Never  Financial Resource Strain: Not on file  Food Insecurity: No Food Insecurity (10/04/2024)   Epic    Worried About Programme Researcher, Broadcasting/film/video in the Last Year:  Never true    Ran Out of Food in the Last Year: Never true  Transportation Needs: No Transportation Needs (10/04/2024)   Epic    Lack of Transportation (Medical): No    Lack of Transportation (Non-Medical): No  Physical Activity: Not on file  Stress: Not on file  Social Connections: Unknown (01/25/2022)   [facility] from [facility]   Social Network    Social Network: Not on file  Depression [phone]): Not on file  Alcohol Screen: Low Risk (10/04/2024)   Alcohol Screen    Last Alcohol Screening Score (AUDIT): 0  Housing: Low Risk (10/04/2024)   Epic    Unable to Pay for Housing in the Last Year: No    Number of Times Moved in the Last Year: 0    Homeless in the Last Year: No  Utilities: Not At Risk (10/04/2024)   Epic    Threatened with loss of utilities: No  Health Literacy: Not on file   Additional Social History:                         Sleep: Fair Estimated Sleeping Duration (Last 24 Hours): 6.75-7.00 hours  Appetite:  Good  Current Medications: Current Facility-Administered Medications  Medication Dose Route Frequency Provider Last Rate Last Admin   acetaminophen  (TYLENOL ) tablet 650 mg  650 mg Oral Q6H PRN Coleman, Carolyn H, NP   650 mg at 10/05/24 2141   alum & mag hydroxide-simeth (MAALOX/MYLANTA) 200-200-20 MG/5ML suspension 30 mL  30 mL Oral Q4H PRN Mardy Elveria DEL, NP       ARIPiprazole  (ABILIFY ) tablet 10 mg  10 mg Oral Daily Clotiel Troop S, DO   10 mg at 10/08/24 9197   haloperidol  (HALDOL ) tablet 5 mg  5 mg Oral TID PRN Mardy Elveria DEL, NP       And   diphenhydrAMINE  (BENADRYL ) capsule 50 mg  50 mg Oral TID PRN Mardy Elveria DEL, NP       haloperidol  lactate (HALDOL ) injection 5 mg  5 mg Intramuscular TID PRN Mardy Elveria DEL, NP       And   diphenhydrAMINE  (BENADRYL ) injection 50 mg  50 mg Intramuscular TID PRN Mardy Elveria DEL, NP       And   LORazepam  (ATIVAN ) injection 2 mg  2 mg Intramuscular TID PRN Mardy Elveria DEL, NP        haloperidol  lactate (HALDOL ) injection 10 mg  10 mg Intramuscular TID PRN Mardy Elveria DEL, NP       And   diphenhydrAMINE  (BENADRYL ) injection 50 mg  50 mg Intramuscular TID PRN Mardy Elveria DEL, NP       And   LORazepam  (ATIVAN ) injection 2 mg  2 mg Intramuscular TID PRN Mardy Elveria DEL, NP       FLUoxetine  (PROZAC ) capsule 20 mg  20 mg Oral Daily Tesla Bochicchio S, DO       hydrOXYzine  (ATARAX ) tablet 25 mg  25 mg Oral TID PRN Coleman, Carolyn H, NP   25 mg  at 10/07/24 2123   magnesium  hydroxide (MILK OF MAGNESIA) suspension 30 mL  30 mL Oral Daily PRN Coleman, Carolyn H, NP       ondansetron  (ZOFRAN -ODT) disintegrating tablet 4 mg  4 mg Oral Q8H PRN Coleman, Carolyn H, NP       propranolol  (INDERAL ) tablet 10 mg  10 mg Oral BID Velina Drollinger S, DO   10 mg at 10/08/24 9197   traZODone  (DESYREL ) tablet 100 mg  100 mg Oral QHS PRN Kresha Abelson S, DO   100 mg at 10/07/24 2123    Lab Results:  Results for orders placed or performed during the hospital encounter of 10/04/24 (from the past 48 hours)  Lipid panel     Status: None   Collection Time: 10/07/24  6:21 AM  Result Value Ref Range   Cholesterol 147 0 - 200 mg/dL    Comment:        ATP III CLASSIFICATION:  <200     mg/dL   Desirable  799-760  mg/dL   Borderline High  >=759    mg/dL   High           Triglycerides 97 <150 mg/dL   HDL 44 >59 mg/dL   Total CHOL/HDL Ratio 3.3 RATIO   VLDL 19 0 - 40 mg/dL   LDL Cholesterol 83 0 - 99 mg/dL    Comment:        Total Cholesterol/HDL:CHD Risk Coronary Heart Disease Risk Table                     Men   Women  1/2 Average Risk   3.4   3.3  Average Risk       5.0   4.4  2 X Average Risk   9.6   7.1  3 X Average Risk  23.4   11.0        Use the calculated Patient Ratio above and the CHD Risk Table to determine the patient's CHD Risk.        ATP III CLASSIFICATION (LDL):  <100     mg/dL   Optimal  899-870  mg/dL   Near or Above                     Optimal  130-159  mg/dL   Borderline  839-810  mg/dL   High  >809     mg/dL   Very High Performed at [facility], [address], [place], [place] [address]   Hemoglobin A1c     Status: None   Collection Time: 10/07/24  6:21 AM  Result Value Ref Range   Hgb A1c MFr Bld 5.3 4.8 - 5.6 %    Comment: (NOTE) Diagnosis of Diabetes The following HbA1c ranges recommended by the American Diabetes Association (ADA) may be used as an aid in the diagnosis of diabetes mellitus.  Hemoglobin             Suggested A1C NGSP%              Diagnosis  <5.7                   Non Diabetic  5.7-6.4                Pre-Diabetic  >6.4                   Diabetic  <7.0  Glycemic control for                       adults with diabetes.     Mean Plasma Glucose 105.41 mg/dL    Comment: Performed at [facility], [address], [place], [place] [address]    Blood Alcohol level:  Lab Results  Component Value Date   ETH <10 11/29/2021   ETH <10 07/05/2021    Metabolic Disorder Labs: Lab Results  Component Value Date   HGBA1C 5.3 10/07/2024   MPG 105.41 10/07/2024   MPG 108 12/02/2021   No results found for: PROLACTIN Lab Results  Component Value Date   CHOL 147 10/07/2024   TRIG 97 10/07/2024   HDL 44 10/07/2024   CHOLHDL 3.3 10/07/2024   VLDL 19 10/07/2024   LDLCALC 83 10/07/2024   LDLCALC 84 12/02/2021    Physical Findings: AIMS:  ,  ,  ,  ,  ,  ,   CIWA:    COWS:     Musculoskeletal: Strength & Muscle Tone: within normal limits Gait & Station: normal Patient leans: N/A  Psychiatric Specialty Exam:  Presentation  General Appearance:  Appropriate for Environment; Casual  Eye Contact: Good  Speech: Clear and Coherent; Normal Rate  Speech Volume: Normal  Handedness: Right   Mood and Affect  Mood: Depressed; Anxious  Affect: Congruent; Depressed; Constricted   Thought Process  Thought Processes: Coherent; Goal  Directed  Descriptions of Associations:Intact  Orientation:Full (Time, Place and Person)  Thought Content:Logical; WDL  History of Schizophrenia/Schizoaffective disorder:No data recorded Duration of Psychotic Symptoms:No data recorded Hallucinations:Hallucinations: Auditory Description of Auditory Hallucinations: telling her negative things  Ideas of Reference:None  Suicidal Thoughts:Suicidal Thoughts: No  Homicidal Thoughts:Homicidal Thoughts: No   Sensorium  Memory: Immediate Fair; Recent Fair  Judgment: Intact  Insight: Present   Executive Functions  Concentration: Fair  Attention Span: Fair  Recall: [employer] of Knowledge: Fair  Language: Fair   Psychomotor Activity  Psychomotor Activity: Psychomotor Activity: Normal   Assets  Assets: Resilience   Sleep  Sleep: Sleep: Fair    Physical Exam: Physical Exam Vitals and nursing note reviewed.  Constitutional:      General: She is not in acute distress.    Appearance: Normal appearance. She is normal weight. She is not ill-appearing or toxic-appearing.  HENT:     Head: Normocephalic and atraumatic.  Pulmonary:     Effort: Pulmonary effort is normal.  Musculoskeletal:        General: Normal range of motion.  Neurological:     General: No focal deficit present.     Mental Status: She is alert.    Review of Systems  Respiratory:  Negative for cough and shortness of breath.   Cardiovascular:  Negative for chest pain.  Gastrointestinal:  Negative for abdominal pain, constipation, diarrhea, nausea and vomiting.  Neurological:  Negative for dizziness, weakness and headaches.  Psychiatric/Behavioral:  Positive for depression and hallucinations (AH). Negative for suicidal ideas. The patient is nervous/anxious.    Blood pressure 111/70, pulse 71, temperature 98 F (36.7 C), resp. rate 18, height 5' 2 (1.575 m), weight 98.3 kg, last menstrual period 10/01/2024, SpO2 100%. Body mass index  is 39.65 kg/m.   Treatment Plan Summary: Daily contact with patient to assess and evaluate symptoms and progress in treatment and Medication management  Felicia Sutton is a 44 yr old female who presented on 1/14 to [facility] with SI due to  worsening depression and AH, she was admitted to [facility] on 1/15.  PPHx is significant for MDD, a history of Polysubstance Abuse (THC, Cocaine, EtOH, IV Heroine), 1 Suicide Attempt (OD 10/2021), and 3 Prior Psychiatric Hospitalizations (last-[facility]20[facility]), and no history of Self Injurious Behavior.    Felicia Sutton to have significant depression so we will start Prozac .  She does continue to have AH but tolerated the increase in Abilify  yesterday so we will continue to monitor.  We will not make any other changes to her medications at this time.  We will continue to monitor.   MDD, Recurrent, Severe, w/ Psychotic Features: -Start Prozac  20 mg daily for depression and anxiety -Continue Propanolol 10 mg BID for anxiety -Continue Abilify  10 mg for psychosis and depression -Continue Agitation Protocol: Haldol /Ativan /Benadryl    -One time dose Haldol  2 mg oral   -Continue PRN's: Tylenol , Maalox, Atarax , Milk of Magnesia, Trazodone    --  The risks/benefits/side-effects/alternatives to medications were discussed in detail with the patient and time was given for questions. The patient consents to medication trials.                -- Metabolic profile and EKG monitoring obtained while on an atypical antipsychotic (BMI: 39.65, Lipid Panel: Ordered, HbgA1c: Ordered, EKG: NSR w/QTc: 403)              -- Encouraged patient to participate in unit milieu and in scheduled group therapies              -- Short Term Goals: Ability to identify changes in lifestyle to reduce recurrence of condition will improve, Ability to verbalize feelings will improve, Ability to disclose and discuss suicidal ideas, Ability to demonstrate self-control will improve, Ability to identify  and develop effective coping behaviors will improve, Ability to maintain clinical measurements within normal limits will improve, Compliance with prescribed medications will improve, and Ability to identify triggers associated with substance abuse/mental health issues will improve             -- Long Term Goals: Improvement in symptoms so as ready for discharge   Safety and Monitoring:             -- Voluntary admission to inpatient psychiatric unit for safety, stabilization and treatment             -- Daily contact with patient to assess and evaluate symptoms and progress in treatment             -- Patient's case to be discussed in multi-disciplinary team meeting             -- Observation Level : q15 minute checks             -- Vital signs:  q12 hours             -- Precautions: suicide, elopement, and assault  Discharge Planning:              -- Social work and case management to assist with discharge planning and identification of hospital follow-up needs prior to discharge             -- Estimated LOS: 4-6 more days             -- Discharge Concerns: Need to establish a safety plan; Medication compliance and effectiveness             -- Discharge Goals: Return home with outpatient referrals for mental health follow-up including medication management/psychotherapy  Marsa GORMAN Rosser, DO 10/08/2024, 9:25 AM

## 2024-10-08 NOTE — Progress Notes (Signed)
" °   10/08/24 0900  Psych Admission Type (Psych Patients Only)  Admission Status Voluntary  Psychosocial Assessment  Patient Complaints Anxiety;Depression  Eye Contact Fair  Facial Expression Anxious  Affect Appropriate to circumstance  Speech Logical/coherent  Interaction Assertive  Motor Activity Other (Comment) (steady gait)  Appearance/Hygiene Unremarkable  Behavior Characteristics Appropriate to situation;Cooperative  Mood Anxious;Pleasant  Aggressive Behavior  Effect No apparent injury  Thought Process  Coherency WDL  Content WDL  Delusions None reported or observed  Perception Hallucinations  Hallucination None reported or observed  Judgment Impaired  Confusion None  Danger to Self  Current suicidal ideation? Denies  Agreement Not to Harm Self Yes  Description of Agreement agreed to contact staff before acting on harmful thoughts  Danger to Others  Danger to Others None reported or observed    "

## 2024-10-08 NOTE — Plan of Care (Signed)
   Problem: Education: Goal: Emotional status will improve Outcome: Progressing Goal: Mental status will improve Outcome: Progressing

## 2024-10-09 DIAGNOSIS — F333 Major depressive disorder, recurrent, severe with psychotic symptoms: Secondary | ICD-10-CM | POA: Diagnosis not present

## 2024-10-09 NOTE — Group Note (Signed)
 Date:  10/09/2024 Time:  10:00 AM  Group Topic/Focus:  Goals Group:   The focus of this group is to help patients establish daily goals to achieve during treatment and discuss how the patient can incorporate goal setting into their daily lives to aide in recovery.    Participation Level:  Active  Participation Quality:  Appropriate  Affect:  Appropriate  Cognitive:  Appropriate  Insight: Appropriate  Engagement in Group:  Engaged  Modes of Intervention:  Discussion  Additional Comments:  pt wants to attend all groups  Nat Rummer 10/09/2024, 10:00 AM

## 2024-10-09 NOTE — Progress Notes (Signed)
 Pt did not attend OT therapy

## 2024-10-09 NOTE — Plan of Care (Signed)
   Problem: Education: Goal: Emotional status will improve Outcome: Progressing Goal: Mental status will improve Outcome: Progressing

## 2024-10-09 NOTE — Group Note (Signed)
 Recreation Therapy Group Note   Group Topic:Communication  Group Date: 10/09/2024 Start Time: 0944 End Time: 1009 Facilitators: Laquana Villari-McCall, LRT,CTRS Location: [address] Day[address]   Group Topic: Communication, Problem Solving   Goal Area(s) Addresses:  Patient will effectively listen to complete activity.  Patient will identify communication skills used to make activity successful.  Patient will identify how skills used during activity can be used to reach post d/c goals.    Behavioral Response:    Intervention: Building Surveyor Activity - Geometric pattern cards, pencils, blank paper    Activity: Geometric Drawings.  Three volunteers from the peer group will be shown an abstract picture with a particular arrangement of geometrical shapes.  Each round, one 'speaker' will describe the pattern, as accurately as possible without revealing the image to the group.  The remaining group members will listen and draw the picture to reflect how it is described to them. Patients with the role of 'listener' cannot ask clarifying questions but, may request that the speaker repeat a direction. Once the drawings are complete, the presenter will show the rest of the group the picture and compare how close each person came to drawing the picture. LRT will facilitate a post-activity discussion regarding effective communication and the importance of planning, listening, and asking for clarification in daily interactions with others.  Education: Environmental consultant, Active listening, Support systems, Discharge planning  Education Outcome: Acknowledges understanding/In group clarification offered/Needs additional education.    Affect/Mood: N/A   Participation Level: Did not attend    Clinical Observations/Individualized Feedback:      Plan: Continue to engage patient in RT group sessions 2-3x/week.   Angely Dietz-McCall, LRT,CTRS 10/09/2024 12:57 PM

## 2024-10-09 NOTE — Progress Notes (Signed)
 Tour of Duty:  Prentice JINNY Angle, RN, 10/09/24, Tour of Duty: 0700-1900  SI/HI/AVH: Denies  Self-Reported   Mood: Negative  Anxiety: Denies, but Observable Depression: Denies, but Observable Irritability: Denies  Broset  Violence Prevention Guidelines *See Row Information*: Small Violence Risk interventions implemented   LBM  Last BM Date : 10/09/24   Pain: not present  Patient Refusals (including Rx): No  >>Shift Summary: Patient observed to be depressed and mildly anxious on unit. Patient able to make needs known. Patient reports recent loss of her father during admission, states it was unexpected. Encouraged patient to seek support during this time and emotional support provided. Patient appears flat and possibly masked, but overall calm. LP made aware, consult with Chaplain submitted. Patient observed to engage appropriately with staff and peers. Patient taking medications as prescribed. This shift, no PRN medication requested or required. No reported or observed side effects to medication. No reported or observed agitation, aggression, or other acute emotional distress. No reported or observed physical abnormalities or concerns.  Last Vitals  Vitals Weight: 98.3 kg Temp: 97.7 F (36.5 C) Temp Source: Oral Pulse Rate: 73 Resp: 16 BP: 121/73 Patient Position: (not recorded)  Admission Type  Psych Admission Type (Psych Patients Only) Admission Status: Voluntary Date 72 hour document signed : (not recorded) Time 72 hour document signed : (not recorded) Provider Notified (First and Last Name) (see details for LINK to note): (not recorded)   Psychosocial Assessment  Psychosocial Assessment Patient Complaints: Depression, Other (Comment) (loss of father) Eye Contact: Fair Facial Expression: Flat Affect: Depressed, Flat Speech: Soft Interaction: Minimal Motor Activity: Other (Comment) (WDL) Appearance/Hygiene: Unremarkable Behavior Characteristics:  Cooperative Mood: Depressed, Sad   Aggressive Behavior  Targets: (not recorded)   Thought Process  Thought Process Coherency: Within Defined Limits Content: Within Defined Limits Delusions: None reported or observed Perception: Within Defined Limits Hallucination: None reported or observed Judgment: Limited Confusion: None  Danger to Self/Others  Danger to Self Current suicidal ideation?: Denies Description of Suicide Plan: (not recorded) Self-Injurious Behavior: (not recorded) Agreement Not to Harm Self: (not recorded) Description of Agreement: (not recorded) Danger to Others: None reported or observed

## 2024-10-09 NOTE — Plan of Care (Signed)
   Problem: Education: Goal: Mental status will improve Outcome: Progressing   Problem: Activity: Goal: Interest or engagement in activities will improve Outcome: Progressing

## 2024-10-09 NOTE — Progress Notes (Signed)
(  Sleep Hours) - 6.75 hours (Any PRNs that were needed, meds refused, or side effects to meds)-  Trazodone , Vistaril , Benadryl  50 mg PO, and Haldol  5 mg PO given (see note) (Any disturbances and when (visitation, over night)- Pt stated that her father died, overheard telling peers that woman answered phone when she called her husband.  (Concerns raised by the patient)-  None other than above. (SI/HI/AVH)-  Denies

## 2024-10-09 NOTE — Plan of Care (Signed)
   Problem: Education: Goal: Knowledge of [facility] Education information/materials will improve Outcome: Progressing Goal: Emotional status will improve Outcome: Progressing Goal: Mental status will improve Outcome: Progressing Goal: Verbalization of understanding the information provided will improve Outcome: Progressing

## 2024-10-09 NOTE — Progress Notes (Addendum)
 [facility] MD Progress Note  10/09/2024 11:08 AM Felicia Sutton  MRN:  [id number] Subjective:   Felicia Sutton is a 44 yr old female who presented on 1/14 to [facility] with SI due to worsening depression and AH, she was admitted to [facility] on 1/15.  PPHx is significant for MDD, a history of Polysubstance Abuse (THC, Cocaine, EtOH, IV Heroine), 1 Suicide Attempt (OD 10/2021), and 3 Prior Psychiatric Hospitalizations (last-[facility] 04/2023), and no history of Self Injurious Behavior.    Case was discussed in the multidisciplinary team. MAR was reviewed and patient was compliant with medications.  She received PRN Agitation oral Benadryl  and Haldol  last night.  She received PRN Hydroxyzine  x2 and Trazodone  yesterday.   Psychiatric Team made the following recommendations yesterday: -Start Prozac  20 mg daily for depression and anxiety -Continue Propanolol 10 mg BID for anxiety -Continue Abilify  10 mg for psychosis and depression   On interview today patient reports she slept poor last night.  She reports her appetite is doing poor.  She reports no SI, HI, or VH.  She reports continued AH.  She reports no Paranoia or Ideas of Reference.  She reports no issues with her medications.  She reports that her father passed away yesterday in his sleep.  She reports that this is devastating news to her.  She reports that she would like to speak to a chaplain and discussed with her that we would place an order and she was thankful.  She reports no other concerns at present.   Principal Problem: MDD (major depressive disorder), recurrent, severe, with psychosis (HCC) Diagnosis: Principal Problem:   MDD (major depressive disorder), recurrent, severe, with psychosis (HCC)  Total Time spent with patient:  I personally spent 35 minutes on the unit in direct patient care. The direct patient care time included face-to-face time with the patient, reviewing the patient's chart, communicating with other professionals, and  coordinating care.    Past Psychiatric History:  MDD, a history of Polysubstance Abuse (THC, Cocaine, EtOH, IV Heroine), 1 Suicide Attempt (OD 10/2021), and 3 Prior Psychiatric Hospitalizations (last-[facility] 04/2023), and no history of Self Injurious Behavior.   Past Medical History:  Past Medical History:  Diagnosis Date   GERD (gastroesophageal reflux disease) 2017   Major depression 12/2019    Past Surgical History:  Procedure Laterality Date   CHOLECYSTECTOMY  01/2019   ESOPHAGOGASTRODUODENOSCOPY (EGD) WITH PROPOFOL  N/A 02/01/2020   Procedure: ESOPHAGOGASTRODUODENOSCOPY (EGD) WITH PROPOFOL ;  Surgeon: Legrand Victory LITTIE DOUGLAS, MD;  Location: [facility] ENDOSCOPY;  Service: Gastroenterology;  Laterality: N/A;   Family History: History reviewed. No pertinent family history. Family Psychiatric  History:  Reports No Known Diagnosis', Suicides, or Substance Abuse   Social History:  Social History   Substance and Sexual Activity  Alcohol Use Not Currently     Social History   Substance and Sexual Activity  Drug Use Not Currently    Social History   Socioeconomic History   Marital status: Single    Spouse name: Not on file   Number of children: Not on file   Years of education: Not on file   Highest education level: Not on file  Occupational History   Not on file  Tobacco Use   Smoking status: Never    Passive exposure: Never   Smokeless tobacco: Never  Vaping Use   Vaping status: Never Used  Substance and Sexual Activity   Alcohol use: Not Currently   Drug use: Not Currently   Sexual activity: Not  Currently  Other Topics Concern   Not on file  Social History Narrative   ** Merged History Encounter **       Social Drivers of Health   Tobacco Use: Low Risk (10/04/2024)   Patient History    Smoking Tobacco Use: Never    Smokeless Tobacco Use: Never    Passive Exposure: Never  Financial Resource Strain: Not on file  Food Insecurity: No Food Insecurity (10/04/2024)   Epic     Worried About Programme Researcher, Broadcasting/film/video in the Last Year: Never true    Ran Out of Food in the Last Year: Never true  Transportation Needs: No Transportation Needs (10/04/2024)   Epic    Lack of Transportation (Medical): No    Lack of Transportation (Non-Medical): No  Physical Activity: Not on file  Stress: Not on file  Social Connections: Unknown (01/25/2022)   Received from [facility]   Social Network    Social Network: Not on file  Depression [phone]): Not on file  Alcohol Screen: Low Risk (10/04/2024)   Alcohol Screen    Last Alcohol Screening Score (AUDIT): 0  Housing: Low Risk (10/04/2024)   Epic    Unable to Pay for Housing in the Last Year: No    Number of Times Moved in the Last Year: 0    Homeless in the Last Year: No  Utilities: Not At Risk (10/04/2024)   Epic    Threatened with loss of utilities: No  Health Literacy: Not on file   Additional Social History:                         Sleep: Poor Estimated Sleeping Duration (Last 24 Hours): 5.75-6.75 hours  Appetite:  Poor  Current Medications: Current Facility-Administered Medications  Medication Dose Route Frequency Provider Last Rate Last Admin   acetaminophen  (TYLENOL ) tablet 650 mg  650 mg Oral Q6H PRN Coleman, Carolyn H, NP   650 mg at 10/05/24 2141   alum & mag hydroxide-simeth (MAALOX/MYLANTA) 200-200-20 MG/5ML suspension 30 mL  30 mL Oral Q4H PRN Coleman, Carolyn H, NP       ARIPiprazole  (ABILIFY ) tablet 10 mg  10 mg Oral Daily Garik Diamant S, DO   10 mg at 10/09/24 9092   haloperidol  (HALDOL ) tablet 5 mg  5 mg Oral TID PRN Coleman, Carolyn H, NP   5 mg at 10/08/24 2102   And   diphenhydrAMINE  (BENADRYL ) capsule 50 mg  50 mg Oral TID PRN Coleman, Carolyn H, NP   50 mg at 10/08/24 2102   haloperidol  lactate (HALDOL ) injection 5 mg  5 mg Intramuscular TID PRN Mardy Elveria DEL, NP       And   diphenhydrAMINE  (BENADRYL ) injection 50 mg  50 mg Intramuscular TID PRN Mardy Elveria DEL, NP        And   LORazepam  (ATIVAN ) injection 2 mg  2 mg Intramuscular TID PRN Mardy Elveria DEL, NP       haloperidol  lactate (HALDOL ) injection 10 mg  10 mg Intramuscular TID PRN Mardy Elveria DEL, NP       And   diphenhydrAMINE  (BENADRYL ) injection 50 mg  50 mg Intramuscular TID PRN Mardy Elveria DEL, NP       And   LORazepam  (ATIVAN ) injection 2 mg  2 mg Intramuscular TID PRN Mardy Elveria DEL, NP       FLUoxetine  (PROZAC ) capsule 20 mg  20 mg Oral Daily Tasheem Elms, Marsa RAMAN, DO  20 mg at 10/09/24 9092   hydrOXYzine  (ATARAX ) tablet 25 mg  25 mg Oral TID PRN Coleman, Carolyn H, NP   25 mg at 10/07/24 2123   magnesium  hydroxide (MILK OF MAGNESIA) suspension 30 mL  30 mL Oral Daily PRN Coleman, Carolyn H, NP       ondansetron  (ZOFRAN -ODT) disintegrating tablet 4 mg  4 mg Oral Q8H PRN Coleman, Carolyn H, NP       propranolol  (INDERAL ) tablet 10 mg  10 mg Oral BID Jurell Basista S, DO   10 mg at 10/09/24 9092   traZODone  (DESYREL ) tablet 100 mg  100 mg Oral QHS PRN Bethanie Bloxom S, DO   100 mg at 10/08/24 2102    Lab Results:  No results found for this or any previous visit (from the past 48 hours).   Blood Alcohol level:  Lab Results  Component Value Date   ETH <10 11/29/2021   ETH <10 07/05/2021    Metabolic Disorder Labs: Lab Results  Component Value Date   HGBA1C 5.3 10/07/2024   MPG 105.41 10/07/2024   MPG 108 12/02/2021   No results found for: PROLACTIN Lab Results  Component Value Date   CHOL 147 10/07/2024   TRIG 97 10/07/2024   HDL 44 10/07/2024   CHOLHDL 3.3 10/07/2024   VLDL 19 10/07/2024   LDLCALC 83 10/07/2024   LDLCALC 84 12/02/2021    Physical Findings: AIMS:  ,  ,  ,  ,  ,  ,   CIWA:    COWS:     Musculoskeletal: Strength & Muscle Tone: within normal limits Gait & Station: normal Patient leans: N/A  Psychiatric Specialty Exam:  Presentation  General Appearance:  Appropriate for Environment; Casual  Eye  Contact: Fair  Speech: Clear and Coherent; Normal Rate  Speech Volume: Normal  Handedness: Right   Mood and Affect  Mood: Depressed  Affect: Flat; Depressed; Appropriate (father passed)   Thought Process  Thought Processes: Coherent; Goal Directed  Descriptions of Associations:Intact  Orientation:Full (Time, Place and Person)  Thought Content:Logical; WDL  History of Schizophrenia/Schizoaffective disorder:No data recorded Duration of Psychotic Symptoms:No data recorded Hallucinations:Hallucinations: Auditory Description of Auditory Hallucinations: telling her negative things  Ideas of Reference:None  Suicidal Thoughts:Suicidal Thoughts: No  Homicidal Thoughts:Homicidal Thoughts: No   Sensorium  Memory: Immediate Fair; Recent Fair  Judgment: Intact  Insight: Present   Executive Functions  Concentration: Fair  Attention Span: Fair  Recall: Fair  Fund of Knowledge: Fair  Language: Fair   Psychomotor Activity  Psychomotor Activity: Psychomotor Activity: Normal   Assets  Assets: Desire for Improvement; Resilience   Sleep  Sleep: Sleep: Poor    Physical Exam: Physical Exam Vitals and nursing note reviewed.  Constitutional:      General: She is not in acute distress.    Appearance: Normal appearance. She is normal weight. She is not ill-appearing or toxic-appearing.  HENT:     Head: Normocephalic and atraumatic.  Pulmonary:     Effort: Pulmonary effort is normal.  Musculoskeletal:        General: Normal range of motion.  Neurological:     General: No focal deficit present.     Mental Status: She is alert.    Review of Systems  Respiratory:  Negative for cough and shortness of breath.   Cardiovascular:  Negative for chest pain.  Gastrointestinal:  Negative for abdominal pain, constipation, diarrhea, nausea and vomiting.  Neurological:  Negative for dizziness, weakness and headaches.  Psychiatric/Behavioral:  Positive  for depression. Negative for hallucinations and suicidal ideas. The patient is nervous/anxious and has insomnia.    Blood pressure 115/81, pulse 71, temperature 97.7 F (36.5 C), temperature source Oral, resp. rate 16, height 5' 2 (1.575 m), weight 98.3 kg, last menstrual period 10/01/2024, SpO2 99%. Body mass index is 39.65 kg/m.   Treatment Plan Summary: Daily contact with patient to assess and evaluate symptoms and progress in treatment and Medication management  Felicia Sutton is a 44 yr old female who presented on 1/14 to [facility] with SI due to worsening depression and AH, she was admitted to [facility] on 1/15.  PPHx is significant for MDD, a history of Polysubstance Abuse (THC, Cocaine, EtOH, IV Heroine), 1 Suicide Attempt (OD 10/2021), and 3 Prior Psychiatric Hospitalizations (last-[facility] 04/20[facility]), and no history of Self Injurious Behavior.    Felicia Sutton tolerated starting Prozac .  She received significant news of the passing of her father but does seem to be handling it as well as could be expected.  We will place a Consult to the Chaplain.  We will not make any changes to her medications at this time.  We will continue to monitor.    MDD, Recurrent, Severe, w/ Psychotic Features: -Continue Prozac  20 mg daily for depression and anxiety -Continue Propanolol 10 mg BID for anxiety -Continue Abilify  10 mg for psychosis and depression -Continue Agitation Protocol: Haldol /Ativan /Benadryl    -One time dose Haldol  2 mg oral   -Continue PRN's: Tylenol , Maalox, Atarax , Milk of Magnesia, Trazodone    --  The risks/benefits/side-effects/alternatives to medications were discussed in detail with the patient and time was given for questions. The patient consents to medication trials.                -- Metabolic profile and EKG monitoring obtained while on an atypical antipsychotic (BMI: 39.65, Lipid Panel: Ordered, HbgA1c: Ordered, EKG: NSR w/QTc: 403)              -- Encouraged patient to participate  in unit milieu and in scheduled group therapies              -- Short Term Goals: Ability to identify changes in lifestyle to reduce recurrence of condition will improve, Ability to verbalize feelings will improve, Ability to disclose and discuss suicidal ideas, Ability to demonstrate self-control will improve, Ability to identify and develop effective coping behaviors will improve, Ability to maintain clinical measurements within normal limits will improve, Compliance with prescribed medications will improve, and Ability to identify triggers associated with substance abuse/mental health issues will improve             -- Long Term Goals: Improvement in symptoms so as ready for discharge   Safety and Monitoring:             -- Voluntary admission to inpatient psychiatric unit for safety, stabilization and treatment             -- Daily contact with patient to assess and evaluate symptoms and progress in treatment             -- Patient's case to be discussed in multi-disciplinary team meeting             -- Observation Level : q15 minute checks             -- Vital signs:  q12 hours             -- Precautions: suicide, elopement, and assault  Discharge Planning:              --  Social work and case management to assist with discharge planning and identification of hospital follow-up needs prior to discharge             -- Estimated LOS: 4-6 more days             -- Discharge Concerns: Need to establish a safety plan; Medication compliance and effectiveness             -- Discharge Goals: Return home with outpatient referrals for mental health follow-up including medication management/psychotherapy   Marsa GORMAN Rosser, DO 10/09/2024, 11:08 AM

## 2024-10-09 NOTE — Progress Notes (Signed)
" °   10/09/24 1600  Spiritual Encounters  Type of Visit Initial  Care provided to: Patient  Referral source Physician  Reason for visit Routine spiritual support  OnCall Visit No   I responded to a spiritual care consult request by Raliegh Marsa RAMAN, DO on behalf of Ms Balla due to loss of her father overnight.  At time of visit Ms Umholtz was resting in her room but welcomed my brief visit. I expressed my condolences to her and my availability to offer support including to process feelings of grief, memories, or other needs of spiritual self and bereavement. I shared that I was about to lead a group that focused on grief and loss but also cautioned that she may not yet feel ready for that conversation. I will return for follow up support. Please page a chaplain if urgent support helpful.  Ralyn Stlaurent L. Delores HERO.Div "

## 2024-10-09 NOTE — Group Note (Signed)
 Date:  10/09/2024 Time:  9:29 PM  Group Topic/Focus:  Alcoholics Anonymous (AA) Meeting    Participation Level:  Active  Participation Quality:  Appropriate  Affect:  Appropriate  Cognitive:  Appropriate  Insight: Appropriate  Engagement in Group:  Engaged  Modes of Intervention:  Discussion, Socialization, and Support  Additional Comments:  Patient attended AA  Felicia Sutton 10/09/2024, 9:29 PM

## 2024-10-09 NOTE — Group Note (Unsigned)
 Date:  10/09/2024 Time:  12:47 PM  Group Topic/Focus:  Emotional Education:   The focus of this group is to discuss what feelings/emotions are, and how they are experienced.     Participation Level:  {BHH PARTICIPATION OZCZO:77735}  Participation Quality:  {BHH PARTICIPATION QUALITY:22265}  Affect:  {BHH AFFECT:22266}  Cognitive:  {BHH COGNITIVE:22267}  Insight: {BHH Insight2:20797}  Engagement in Group:  {BHH ENGAGEMENT IN HMNLE:77731}  Modes of Intervention:  {BHH MODES OF INTERVENTION:22269}  Additional Comments:  ***  Malaina Mortellaro L Janaysha Depaulo 10/09/2024, 12:47 PM

## 2024-10-09 NOTE — Progress Notes (Signed)
 Pt did not attend grief and loss session

## 2024-10-10 DIAGNOSIS — F333 Major depressive disorder, recurrent, severe with psychotic symptoms: Secondary | ICD-10-CM | POA: Diagnosis not present

## 2024-10-10 MED ORDER — FLUOXETINE HCL 20 MG PO CAPS
40.0000 mg | ORAL_CAPSULE | Freq: Every day | ORAL | Status: DC
  Administered 2024-10-11 – 2024-10-13 (×3): 40 mg via ORAL
  Filled 2024-10-10 (×2): qty 2
  Filled 2024-10-10: qty 14
  Filled 2024-10-10: qty 2

## 2024-10-10 NOTE — Progress Notes (Signed)
(  Sleep Hours) -7.75 as of 0530 (Any PRNs that were needed, meds refused, or side effects to meds)- prn hydroxyzine  and trazodone  @ 2114 (Any disturbances and when (visitation, over night)-none (Concerns raised by the patient)- none (SI/HI/AVH)- denies all

## 2024-10-10 NOTE — B[facility] Group Notes (Signed)
 [facility] Group Notes:  (Nursing/MHT/Case Management/Adjunct)  Date:  10/10/2024  Time:  11:36 PM  Type of Therapy:  Wrap- up  Participation Level:  Active  Participation Quality:  Appropriate  Affect:  Appropriate  Cognitive:  Appropriate  Insight:  Appropriate  Engagement in Group:  Engaged  Modes of Intervention:  Education  Summary of Progress/Problems: Goal to attend groups. Rated day 9/10.  Grayce LITTIE Essex 10/10/2024, 11:36 PM

## 2024-10-10 NOTE — Group Note (Addendum)
 LCSW Group Therapy Note   Group Date: 10/10/2024 Start Time: 1100 End Time: 1200   Participation:  patient was present and actively participated in the discussion.  Type of Therapy:  Group Therapy  Topic:  Shining from Within:  Confidence and Self-Love Journey  Objective:  Promote Self-Awareness and Realistic Self-Talk: Help participants recognize their strengths and replace negative thoughts with truthful, realistic statements to build confidence.  Goals: Increase Confidence: Help participants develop a positive self-image by focusing on their strengths and personal progress. Set Achievable Goals: Guide participants in creating small, realistic goals that foster a sense of accomplishment and build momentum. Enhance Self-Care Practices: Encourage participants to incorporate self-care activities into their routine to support emotional well-being and reinforce confidence.  Summary:  The Shining from Within: Confidence and Self-Love Journey group helped individuals build stronger self-confidence through truthful, realistic self-talk, goal-setting, and self-care practices. Participants recognized their unique strengths, set achievable goals, and nurtured a supportive environment for mutual growth. The group provided practical tools to foster lasting confidence and self-belief, empowering each member to shine from within and embrace their personal power with greater self-assurance.  Therapeutic Modalities:   Cognitive Behavioral Therapy (CBT): Identify and reframe negative self-talk into realistic, positive thoughts Strengths-Based Therapy: Focus on personal strengths, successes, and abilities Psychoeducation: Teach concepts of self-esteem, confidence, and healthy self-talk   Catherene MALVA Dynes, LCSW 10/10/2024  12:42 PM

## 2024-10-10 NOTE — Group Note (Signed)
 Date:  10/10/2024 Time:  4:25 PM  Group Topic/Focus:[employer]  SmartGoals Group:   The focus of this group is to help patients establish daily goals to achieve during treatment and discuss how the patient can incorporate goal setting into their daily lives to aide in recovery.    Participation Level:  Active   Dolores HERO Marieli Rudy 10/10/2024, 4:25 PM

## 2024-10-10 NOTE — Progress Notes (Signed)
 Tour of Duty:  Prentice JINNY Angle, RN, 10/11/24, Tour of Duty: 0700-1900  SI/HI/AVH: Denies  Self-Reported   Mood: Negative  Anxiety: Denies Depression: Endorses Irritability: Denies  Broset  Violence Prevention Guidelines *See Row Information*: Small Violence Risk interventions implemented   LBM  Last BM Date : 10/10/24   Pain: not present  Patient Refusals (including Rx): No  >>Shift Summary: Patient observed to be flat and depressed on unit. Patient able to make needs known. Patient observed to engage appropriately with staff and peers, but minimal initiation. Patient taking medications as prescribed. This shift, no PRN medication requested or required. No reported or observed side effects to medication. No reported or observed agitation, aggression, or other acute emotional distress. No reported or observed physical abnormalities or concerns.  Last Vitals  Vitals Weight: 98.3 kg Temp: 98.1 F (36.7 C) Temp Source: Oral Pulse Rate: 70 Resp: 18 BP: 117/81 Patient Position: (not recorded)  Admission Type  Psych Admission Type (Psych Patients Only) Admission Status: Voluntary Date 72 hour document signed : (not recorded) Time 72 hour document signed : (not recorded) Provider Notified (First and Last Name) (see details for LINK to note): (not recorded)   Psychosocial Assessment  Psychosocial Assessment Patient Complaints: Depression Eye Contact: Fair Facial Expression: Flat Affect: Depressed, Flat Speech: Soft Interaction: Minimal Motor Activity: Other (Comment) (WDL) Appearance/Hygiene: Unremarkable Behavior Characteristics: Cooperative Mood: Depressed, Pleasant   Aggressive Behavior  Targets: (not recorded)   Thought Process  Thought Process Coherency: Within Defined Limits Content: Within Defined Limits Delusions: None reported or observed Perception: Within Defined Limits Hallucination: None reported or observed Judgment: Limited Confusion:  None  Danger to Self/Others  Danger to Self Current suicidal ideation?: Denies Description of Suicide Plan: (not recorded) Self-Injurious Behavior: (not recorded) Agreement Not to Harm Self: (not recorded) Description of Agreement: (not recorded) Danger to Others: None reported or observed

## 2024-10-10 NOTE — Group Note (Signed)
 Date:  10/10/2024 Time:  12:56 PM  Group Topic/Focus:Social work/ Confidence and self worth Self Care:   The focus of this group is to help patients understand the importance of self-care in order to improve or restore emotional, physical, spiritual, interpersonal, and financial health.    Participation Level:  Active   Felicia Sutton HERO Connee Ikner 10/10/2024, 12:56 PM

## 2024-10-10 NOTE — Group Note (Signed)
 Date:  10/10/2024 Time:  10:53 AM  Group Topic/Focus: PET therapy Overcoming Stress:   The focus of this group is to define stress and help patients assess their triggers.    Participation Level:  Active   Felicia Sutton 10/10/2024, 10:53 AM

## 2024-10-10 NOTE — Group Note (Signed)
 Date:  10/10/2024 Time:  9:46 AM  Group Topic/Focus: goals and orientation Goals Group:   The focus of this group is to help patients establish daily goals to achieve during treatment and discuss how the patient can incorporate goal setting into their daily lives to aide in recovery. Orientation:   The focus of this group is to educate the patient on the purpose and policies of crisis stabilization and provide a format to answer questions about their admission.  The group details unit policies and expectations of patients while admitted.    Participation Level:  Did Not Attend  Felicia Sutton 10/10/2024, 9:46 AM

## 2024-10-10 NOTE — Progress Notes (Signed)
 [facility] MD Progress Note  10/10/2024 8:09 AM Cristianna Cyr  MRN:  [id number]    Principal Problem: MDD (major depressive disorder), recurrent, severe, with psychosis (HCC) Diagnosis: Principal Problem:   MDD (major depressive disorder), recurrent, severe, with psychosis (HCC)  Total Time spent with patient:  I personally spent 35 minutes on the unit in direct patient care. The direct patient care time included face-to-face time with the patient, reviewing the patient's chart, communicating with other professionals, and coordinating care.    Identifying Information and Past Psychiatric History:  Felicia Sutton is a 44 yr old female who presented on 1/14 to [facility] with SI due to worsening depression and AH, she was admitted to [facility] on 1/15.  PPHx is significant for MDD, a history of Polysubstance Abuse (THC, Cocaine, EtOH, IV Heroine), 1 Suicide Attempt (OD 10/2021), and 3 Prior Psychiatric Hospitalizations (last-[facility] 04/2023), and no history of Self Injurious Behavior.   Interval events: Patient documented to appear depressed and mildly anxious, taking medications and interacting appropriately with staff and peers. Did not require agitation protocol and no behavioral concerns reported. Denying SI, HI and AVH. Slept 7.75 hrs overnight. BP 102/72 (BP Location: Left Arm)   Pulse 68   Temp 98.4 F (36.9 C) (Oral)   Resp 18   Ht 5' 2 (1.575 m)   Wt 98.3 kg   LMP 10/01/2024   SpO2 97%   BMI 39.65 kg/m    Interview Today the patient reports she is okay. States she was depressed and suicidal when she came in, continues to feel depressed but no SI. Does think that going to groups has been helpful for her to process some things. She denies any hallucinations today, reporting she has had some benefit with the medications. Sleep was a bit better. Overall feels that medicines have been helpful but thinks that she could use further adjustments to help with her mood. Agreeable to an increase in  prozac  tomorrow.    Past Medical History:  Past Medical History:  Diagnosis Date   GERD (gastroesophageal reflux disease) 2017   Major depression 12/2019    Past Surgical History:  Procedure Laterality Date   CHOLECYSTECTOMY  01/2019   ESOPHAGOGASTRODUODENOSCOPY (EGD) WITH PROPOFOL  N/A 02/01/2020   Procedure: ESOPHAGOGASTRODUODENOSCOPY (EGD) WITH PROPOFOL ;  Surgeon: Legrand Victory LITTIE DOUGLAS, MD;  Location: [facility] ENDOSCOPY;  Service: Gastroenterology;  Laterality: N/A;   Family History: History reviewed. No pertinent family history. Family Psychiatric  History:  Reports No Known Diagnosis', Suicides, or Substance Abuse   Social History:  Social History   Substance and Sexual Activity  Alcohol Use Not Currently     Social History   Substance and Sexual Activity  Drug Use Not Currently    Social History   Socioeconomic History   Marital status: Single    Spouse name: Not on file   Number of children: Not on file   Years of education: Not on file   Highest education level: Not on file  Occupational History   Not on file  Tobacco Use   Smoking status: Never    Passive exposure: Never   Smokeless tobacco: Never  Vaping Use   Vaping status: Never Used  Substance and Sexual Activity   Alcohol use: Not Currently   Drug use: Not Currently   Sexual activity: Not Currently  Other Topics Concern   Not on file  Social History Narrative   ** Merged History Encounter **       Social Drivers of Health  Tobacco Use: Low Risk (10/04/2024)   Patient History    Smoking Tobacco Use: Never    Smokeless Tobacco Use: Never    Passive Exposure: Never  Financial Resource Strain: Not on file  Food Insecurity: No Food Insecurity (10/04/2024)   Epic    Worried About Programme Researcher, Broadcasting/film/video in the Last Year: Never true    Ran Out of Food in the Last Year: Never true  Transportation Needs: No Transportation Needs (10/04/2024)   Epic    Lack of Transportation (Medical): No    Lack of  Transportation (Non-Medical): No  Physical Activity: Not on file  Stress: Not on file  Social Connections: Unknown (01/25/2022)   [facility]   Social Network    Social Network: Not on file  Depression [phone]): Not on file  Alcohol Screen: Low Risk (10/04/2024)   Alcohol Screen    Last Alcohol Screening Score (AUDIT): 0  Housing: Low Risk (10/04/2024)   Epic    Unable to Pay for Housing in the Last Year: No    Number of Times Moved in the Last Year: 0    Homeless in the Last Year: No  Utilities: Not At Risk (10/04/2024)   Epic    Threatened with loss of utilities: No  Health Literacy: Not on file    Current Medications: Current Facility-Administered Medications  Medication Dose Route Frequency Provider Last Rate Last Admin   acetaminophen  (TYLENOL ) tablet 650 mg  650 mg Oral Q6H PRN Coleman, Carolyn H, NP   650 mg at 10/05/24 2141   alum & mag hydroxide-simeth (MAALOX/MYLANTA) 200-200-20 MG/5ML suspension 30 mL  30 mL Oral Q4H PRN Coleman, Carolyn H, NP       ARIPiprazole  (ABILIFY ) tablet 10 mg  10 mg Oral Daily Pashayan, Alexander S, DO   10 mg at 10/09/24 9092   haloperidol  (HALDOL ) tablet 5 mg  5 mg Oral TID PRN Coleman, Carolyn H, NP   5 mg at 10/08/24 2102   And   diphenhydrAMINE  (BENADRYL ) capsule 50 mg  50 mg Oral TID PRN Coleman, Carolyn H, NP   50 mg at 10/08/24 2102   haloperidol  lactate (HALDOL ) injection 5 mg  5 mg Intramuscular TID PRN Mardy Elveria DEL, NP       And   diphenhydrAMINE  (BENADRYL ) injection 50 mg  50 mg Intramuscular TID PRN Mardy Elveria DEL, NP       And   LORazepam  (ATIVAN ) injection 2 mg  2 mg Intramuscular TID PRN Mardy Elveria DEL, NP       haloperidol  lactate (HALDOL ) injection 10 mg  10 mg Intramuscular TID PRN Mardy Elveria DEL, NP       And   diphenhydrAMINE  (BENADRYL ) injection 50 mg  50 mg Intramuscular TID PRN Mardy Elveria DEL, NP       And   LORazepam  (ATIVAN ) injection 2 mg  2 mg Intramuscular TID PRN Mardy Elveria DEL, NP       FLUoxetine  (PROZAC ) capsule 20 mg  20 mg Oral Daily Pashayan, Alexander S, DO   20 mg at 10/09/24 9092   hydrOXYzine  (ATARAX ) tablet 25 mg  25 mg Oral TID PRN Coleman, Carolyn H, NP   25 mg at 10/09/24 2114   magnesium  hydroxide (MILK OF MAGNESIA) suspension 30 mL  30 mL Oral Daily PRN Coleman, Carolyn H, NP       ondansetron  (ZOFRAN -ODT) disintegrating tablet 4 mg  4 mg Oral Q8H PRN Mardy Elveria DEL, NP  propranolol  (INDERAL ) tablet 10 mg  10 mg Oral BID Pashayan, Alexander S, DO   10 mg at 10/09/24 1708   traZODone  (DESYREL ) tablet 100 mg  100 mg Oral QHS PRN Pashayan, Alexander S, DO   100 mg at 10/09/24 2114    Lab Results:  No results found for this or any previous visit (from the past 48 hours).   Blood Alcohol level:  Lab Results  Component Value Date   ETH <10 11/29/2021   ETH <10 07/05/2021    Metabolic Disorder Labs: Lab Results  Component Value Date   HGBA1C 5.3 10/07/2024   MPG 105.41 10/07/2024   MPG 108 12/02/2021   No results found for: PROLACTIN Lab Results  Component Value Date   CHOL 147 10/07/2024   TRIG 97 10/07/2024   HDL 44 10/07/2024   CHOLHDL 3.3 10/07/2024   VLDL 19 10/07/2024   LDLCALC 83 10/07/2024   LDLCALC 84 12/02/2021    Physical Findings: AIMS:  ,  ,  ,  ,  ,  ,   CIWA:    COWS:     Mental Status exam: Appearance: white female of short stature, appropriately groomed in blue scrubs  Eye contact: good  Attitude towards examiner polite, somewhat withdrawn  Psychomotor: no agitaton or retardation  Speech: quiet, soft, normal in prosody  Language: no delays  Mood: okay - still down  Affect: congruent, restricted  Thought content: denying SI and HI today, no delusions expressed  Thought Process: linear and organized on conversation  Perception: denying SI and HI, not RTIS  Insight: fair to good  Judgement: fair to good   Orientation: x3 Attention/Concentration: good - attends to interview   Memory/Cognition: grossly intact on conversation   Progress [employer] of Knowledge: Average    Musculoskeletal: Strength & Muscle Tone: within normal limits Gait & Station: normal Patient leans: N/A  Physical Exam Vitals and nursing note reviewed.  Constitutional:      General: She is not in acute distress.    Appearance: Normal appearance. She is normal weight. She is not ill-appearing or toxic-appearing.  HENT:     Head: Normocephalic and atraumatic.  Pulmonary:     Effort: Pulmonary effort is normal.  Musculoskeletal:        General: Normal range of motion.  Neurological:     General: No focal deficit present.     Mental Status: She is alert.    Review of Systems  Respiratory:  Negative for cough and shortness of breath.   Cardiovascular:  Negative for chest pain.  Gastrointestinal:  Negative for abdominal pain, constipation, diarrhea, nausea and vomiting.  Neurological:  Negative for dizziness, weakness and headaches.  Psychiatric/Behavioral:  Positive for depression. Negative for hallucinations and suicidal ideas. The patient is nervous/anxious and has insomnia.    Blood pressure 102/72, pulse 68, temperature 98.4 F (36.9 C), temperature source Oral, resp. rate 18, height 5' 2 (1.575 m), weight 98.3 kg, last menstrual period 10/01/2024, SpO2 97%. Body mass index is 39.65 kg/m.   Treatment Plan Summary: Daily contact with patient to assess and evaluate symptoms and progress in treatment and Medication management  Assessment: The patient is a 44 y.o. female with a psychiatric history currently most consistent with major depressive disorder with psychotic features and history of substance use concerns. On this admission she presented with low mood and SI as well as endorsed AH for which she was started on a combination of prozac  and abilify  with buspirone  added for anxiety management.  Admission complicated by death of her father, for which patient saw chaplain. Today she remained  with restricted affect, ongoing depressive symptoms. Although complicated by psychosocial stressors, given severity of depressive symptoms since admission will plan to increase prozac  to 40 mg tomorrow. AH appears to have resolved with abilify  and will continue this at 10 mg.   MDD, Recurrent, Severe, w/ Psychotic Features: -Continue Prozac  20 mg daily for depression and anxiety  -plan to increase to 40 mg tomorrow  -Continue Propanolol 10 mg BID for anxiety -Continue Abilify  10 mg for psychosis and depression -Continue Agitation Protocol: Haldol /Ativan /Benadryl    -One time dose Haldol  2 mg oral   -Continue PRN's: Tylenol , Maalox, Atarax , Milk of Magnesia, Trazodone    --  The risks/benefits/side-effects/alternatives to medications were discussed in detail with the patient and time was given for questions. The patient consents to medication trials.                -- Metabolic profile and EKG monitoring obtained while on an atypical antipsychotic (BMI: 39.65, Lipid Panel: Ordered, HbgA1c: Ordered, EKG: NSR w/QTc: 403)              -- Encouraged patient to participate in unit milieu and in scheduled group therapies              -- Short Term Goals: Ability to identify changes in lifestyle to reduce recurrence of condition will improve, Ability to verbalize feelings will improve, Ability to disclose and discuss suicidal ideas, Ability to demonstrate self-control will improve, Ability to identify and develop effective coping behaviors will improve, Ability to maintain clinical measurements within normal limits will improve, Compliance with prescribed medications will improve, and Ability to identify triggers associated with substance abuse/mental health issues will improve             -- Long Term Goals: Improvement in symptoms so as ready for discharge   Safety and Monitoring:             -- Voluntary admission to inpatient psychiatric unit for safety, stabilization and treatment             --  Daily contact with patient to assess and evaluate symptoms and progress in treatment             -- Patient's case to be discussed in multi-disciplinary team meeting             -- Observation Level : q15 minute checks             -- Vital signs:  q12 hours             -- Precautions: suicide, elopement, and assault  Discharge Planning:              -- Social work and case management to assist with discharge planning and identification of hospital follow-up needs prior to discharge             -- Estimated LOS: 4-6 more days             -- Discharge Concerns: Need to establish a safety plan; Medication compliance and effectiveness             -- Discharge Goals: Return home with outpatient referrals for mental health follow-up including medication management/psychotherapy   Leita LOISE Arts, MD 10/10/2024, 8:09 AM

## 2024-10-10 NOTE — Group Note (Signed)
 Recreation Therapy Group Note   Group Topic:Animal Assisted Therapy   Group Date: 10/10/2024 Start Time: 9049 End Time: 1031 Facilitators: Kenyatte Gruber-McCall, LRT,CTRS Location: [address] Dayroom   Animal-Assisted Activity (AAA) Program Checklist/Progress Notes Patient Eligibility Criteria Checklist & Daily Group note for Rec Tx Intervention  AAA/T Program Assumption of Risk Form signed by Patient/ or Parent Legal Guardian Yes  Patient is free of allergies or severe asthma Yes  Patient reports no fear of animals Yes  Patient reports no history of cruelty to animals Yes  Patient understands his/her participation is voluntary Yes  Patient washes hands before animal contact Yes  Patient washes hands after animal contact Yes  Behavioral Response: Engaged   Education: Charity Fundraiser, Appropriate Animal Interaction   Education Outcome: Acknowledges education.    Affect/Mood: Appropriate   Participation Level: Engaged   Participation Quality: Independent   Behavior: Appropriate   Speech/Thought Process: Focused   Insight: Good   Judgement: Good   Modes of Intervention: Teaching Laboratory Technician   Patient Response to Interventions:  Engaged   Education Outcome:  In group clarification offered    Clinical Observations/Individualized Feedback:  Patient attended session and interacted appropriately with therapy dog and peers. Patient asked appropriate questions about therapy dog and his training. Patient shared stories about their pets at home with group.     Plan: Continue to engage patient in RT group sessions 2-3x/week.   Jalynne Persico-McCall, LRT,CTRS 10/10/2024 12:38 PM

## 2024-10-10 NOTE — Group Note (Signed)
 Date:  10/10/2024 Time:  3:50 PM  Group Topic/Focus: Write a note to your past or present self Emotional Education:   The focus of this group is to discuss what feelings/emotions are, and how they are experienced.    Participation Level:  Active  Participation Quality:  Appropriate  Affect:  Appropriate  Cognitive:  Appropriate  Insight: Appropriate  Engagement in Group:  Engaged  Modes of Intervention:  Discussion  Additional Comments:  Pt engaged appropriately during group  Shanda D Sonal Dorwart 10/10/2024, 3:50 PM

## 2024-10-10 NOTE — Plan of Care (Signed)
   Problem: Education: Goal: Emotional status will improve Outcome: Progressing Goal: Mental status will improve Outcome: Progressing

## 2024-10-10 NOTE — Plan of Care (Signed)
   Problem: Education: Goal: Knowledge of [facility] Education information/materials will improve Outcome: Progressing Goal: Emotional status will improve Outcome: Progressing Goal: Mental status will improve Outcome: Progressing Goal: Verbalization of understanding the information provided will improve Outcome: Progressing

## 2024-10-11 ENCOUNTER — Encounter (HOSPITAL_COMMUNITY): Payer: Self-pay

## 2024-10-11 DIAGNOSIS — F333 Major depressive disorder, recurrent, severe with psychotic symptoms: Secondary | ICD-10-CM | POA: Diagnosis not present

## 2024-10-11 MED ORDER — TRAZODONE HCL 150 MG PO TABS: 150.0000 mg | ORAL_TABLET | Freq: Every day | ORAL | Status: DC

## 2024-10-11 MED ORDER — TRAZODONE HCL 100 MG PO TABS
100.0000 mg | ORAL_TABLET | Freq: Every day | ORAL | Status: DC
  Administered 2024-10-11 – 2024-10-12 (×2): 100 mg via ORAL
  Filled 2024-10-11: qty 1
  Filled 2024-10-11: qty 7
  Filled 2024-10-11: qty 1

## 2024-10-11 NOTE — Group Note (Signed)
 Date:  10/11/2024 Time:  5:41 PM  Group Topic/Focus:  Relapse Prevention Planning:   The focus of this group is to define relapse and discuss the need for planning to combat relapse. Natural ways to get dopamine hits as opposed to illicit drugs which cause psychosis.    Participation Level:  Did Not Attend   Felicia Sutton 10/11/2024, 5:41 PM

## 2024-10-11 NOTE — Plan of Care (Signed)
   Problem: Education: Goal: Emotional status will improve Outcome: Progressing Goal: Mental status will improve Outcome: Progressing

## 2024-10-11 NOTE — Group Note (Signed)
 Date:  10/11/2024 Time:  6:36 PM  Group Topic/Focus:  Goals Group:   The focus of this group is to help patients establish daily goals to achieve during treatment and discuss how the patient can incorporate goal setting into their daily lives to aide in recovery. Orientation:   The focus of this group is to educate the patient on the purpose and policies of crisis stabilization and provide a format to answer questions about their admission.  The group details unit policies and expectations of patients while admitted.    Participation Level:  Did Not Attend   Felicia Sutton 10/11/2024, 6:36 PM

## 2024-10-11 NOTE — Group Note (Signed)
 Date:  10/11/2024 Time:  1:28 PM  Group Topic/Focus:   Pharmacy Group  Pt did attend pharmacy group  Idalia Allbritton D Hollace Michelli 10/11/2024, 1:28 PM

## 2024-10-11 NOTE — Progress Notes (Signed)

## 2024-10-11 NOTE — Group Note (Signed)
 Date:  10/11/2024 Time:  3:56 PM  Group Topic/Focus:   Overcoming Stress:   The focus of this group is to introduce and practice Progressive Muscle Relaxation, a technique that helps reduce physical tension and alleviate stress and anger.    Participation Level:  Minimal  Participation Quality:  Appropriate  Affect:  Appropriate  Cognitive:  Appropriate  Insight: Appropriate  Engagement in Group:  Developing/Improving  Modes of Intervention:  Activity  Additional Comments:    Jaeleah Smyser D Keshia Weare 10/11/2024, 3:56 PM

## 2024-10-11 NOTE — Group Note (Signed)
 Date:  10/11/2024 Time:  6:50 PM  Group Topic/Focus:   Spiritual Wellness (Chaplain)     Participation Level:  Did Not Attend  Felicia Sutton 10/11/2024, 6:50 PM

## 2024-10-11 NOTE — [facility] Group Notes (Signed)
 [facility] Group Notes:  (Nursing/MHT/Case Management/Adjunct)  Date:  10/11/2024  Time:  8:05 PM  Type of Therapy:  NA group  Participation Level:  Active  Participation Quality:  Appropriate  Affect:  Appropriate  Cognitive:  Appropriate  Insight:  Appropriate  Engagement in Group:  Engaged  Modes of Intervention:  Education  Summary of Progress/Problems: Attended NA meeting.  Grayce LITTIE Essex 10/11/2024, 8:05 PM

## 2024-10-11 NOTE — Progress Notes (Signed)
 [facility] MD Progress Note  10/11/2024 9:54 AM Tamy Accardo  MRN:  [id number]    Principal Problem: MDD (major depressive disorder), recurrent, severe, with psychosis (HCC) Diagnosis: Principal Problem:   MDD (major depressive disorder), recurrent, severe, with psychosis (HCC)  Identifying Information and Past Psychiatric History:  Xavier Fournier is a 44 yr old female who presented on 1/14 to [facility] with SI due to worsening depression and AH, she was admitted to [facility] on 1/15.  PPHx is significant for MDD, a history of Polysubstance Abuse (THC, Cocaine, EtOH, IV Heroine), 1 Suicide Attempt (OD 10/2021), and 3 Prior Psychiatric Hospitalizations (last-[facility] 04/2023), and no history of Self Injurious Behavior.   Daily notes: Meyah is seen in her room this morning, chart reviewed. The chart findings dicussed with the treatment team. She presents alert, oriented & aware of situation. She is visible on the unit, attending group sessions. She reports, I came to the hospital because I was mourning the recent death of my mother who died two weeks ago. Then, few days ago, I found-out that my father has passed as well. I felt really depressed because I lost both my parents back to back. Today, I can tell you that I'm still feeling very sad. This is the first time I have been in a hospital like this. I'm feeling a little better, but still sad because I keep thinking about my daddy. I slept poorly last night. I'm not sure if the medicines are helping me or not. But I have been going to the group sessions which I think are helpful. Jaanai currently denies SIHI, AVH, delusional thoughts or paranoia. She does not appear to be responding to any internal stimuli. Her Trazodone  100 mg is changed from prn to routine Q 8 PM for insomnia. There are no behavioral issues reported by staff. Patient's vital signs remain. Continue current plan of care as already in progress.   Past Medical History:  Past Medical History:   Diagnosis Date   GERD (gastroesophageal reflux disease) 2017   Major depression 12/2019    Past Surgical History:  Procedure Laterality Date   CHOLECYSTECTOMY  01/2019   ESOPHAGOGASTRODUODENOSCOPY (EGD) WITH PROPOFOL  N/A 02/01/2020   Procedure: ESOPHAGOGASTRODUODENOSCOPY (EGD) WITH PROPOFOL ;  Surgeon: Legrand Victory LITTIE DOUGLAS, MD;  Location: [facility] ENDOSCOPY;  Service: Gastroenterology;  Laterality: N/A;   Family History: History reviewed. No pertinent family history. Family Psychiatric  History:  Reports No Known Diagnosis', Suicides, or Substance Abuse   Social History:  Social History   Substance and Sexual Activity  Alcohol Use Not Currently     Social History   Substance and Sexual Activity  Drug Use Not Currently    Social History   Socioeconomic History   Marital status: Single    Spouse name: Not on file   Number of children: Not on file   Years of education: Not on file   Highest education level: Not on file  Occupational History   Not on file  Tobacco Use   Smoking status: Never    Passive exposure: Never   Smokeless tobacco: Never  Vaping Use   Vaping status: Never Used  Substance and Sexual Activity   Alcohol use: Not Currently   Drug use: Not Currently   Sexual activity: Not Currently  Other Topics Concern   Not on file  Social History Narrative   ** Merged History Encounter **       Social Drivers of Health   Tobacco Use: Low Risk (10/04/2024)  Patient History    Smoking Tobacco Use: Never    Smokeless Tobacco Use: Never    Passive Exposure: Never  Financial Resource Strain: Not on file  Food Insecurity: No Food Insecurity (10/04/2024)   Epic    Worried About Programme Researcher, Broadcasting/film/video in the Last Year: Never true    Ran Out of Food in the Last Year: Never true  Transportation Needs: No Transportation Needs (10/04/2024)   Epic    Lack of Transportation (Medical): No    Lack of Transportation (Non-Medical): No  Physical Activity: Not on file  Stress: Not  on file  Social Connections: Unknown (01/25/2022)   Received from [facility]   Social Network    Social Network: Not on file  Depression [phone]): Not on file  Alcohol Screen: Low Risk (10/04/2024)   Alcohol Screen    Last Alcohol Screening Score (AUDIT): 0  Housing: Low Risk (10/04/2024)   Epic    Unable to Pay for Housing in the Last Year: No    Number of Times Moved in the Last Year: 0    Homeless in the Last Year: No  Utilities: Not At Risk (10/04/2024)   Epic    Threatened with loss of utilities: No  Health Literacy: Not on file    Current Medications: Current Facility-Administered Medications  Medication Dose Route Frequency Provider Last Rate Last Admin   acetaminophen  (TYLENOL ) tablet 650 mg  650 mg Oral Q6H PRN Coleman, Carolyn H, NP   650 mg at 10/05/24 2141   alum & mag hydroxide-simeth (MAALOX/MYLANTA) 200-200-20 MG/5ML suspension 30 mL  30 mL Oral Q4H PRN Mardy Elveria DEL, NP       ARIPiprazole  (ABILIFY ) tablet 10 mg  10 mg Oral Daily Pashayan, Alexander S, DO   10 mg at 10/11/24 0932   haloperidol  (HALDOL ) tablet 5 mg  5 mg Oral TID PRN Coleman, Carolyn H, NP   5 mg at 10/08/24 2102   And   diphenhydrAMINE  (BENADRYL ) capsule 50 mg  50 mg Oral TID PRN Coleman, Carolyn H, NP   50 mg at 10/08/24 2102   haloperidol  lactate (HALDOL ) injection 5 mg  5 mg Intramuscular TID PRN Mardy Elveria DEL, NP       And   diphenhydrAMINE  (BENADRYL ) injection 50 mg  50 mg Intramuscular TID PRN Mardy Elveria DEL, NP       And   LORazepam  (ATIVAN ) injection 2 mg  2 mg Intramuscular TID PRN Coleman, Carolyn H, NP       haloperidol  lactate (HALDOL ) injection 10 mg  10 mg Intramuscular TID PRN Mardy Elveria DEL, NP       And   diphenhydrAMINE  (BENADRYL ) injection 50 mg  50 mg Intramuscular TID PRN Mardy Elveria DEL, NP       And   LORazepam  (ATIVAN ) injection 2 mg  2 mg Intramuscular TID PRN Mardy Elveria DEL, NP       FLUoxetine  (PROZAC ) capsule 40 mg  40 mg Oral Daily Towana Leita SAILOR, MD   40 mg at 10/11/24 9068   hydrOXYzine  (ATARAX ) tablet 25 mg  25 mg Oral TID PRN Coleman, Carolyn H, NP   25 mg at 10/10/24 2104   magnesium  hydroxide (MILK OF MAGNESIA) suspension 30 mL  30 mL Oral Daily PRN Coleman, Carolyn H, NP       ondansetron  (ZOFRAN -ODT) disintegrating tablet 4 mg  4 mg Oral Q8H PRN Coleman, Carolyn H, NP       propranolol  (INDERAL ) tablet  10 mg  10 mg Oral BID Pashayan, Alexander S, DO   10 mg at 10/11/24 9068   traZODone  (DESYREL ) tablet 100 mg  100 mg Oral QHS PRN Pashayan, Alexander S, DO   100 mg at 10/10/24 2105    Lab Results:  No results found for this or any previous visit (from the past 48 hours).   Blood Alcohol level:  Lab Results  Component Value Date   ETH <10 11/29/2021   ETH <10 07/05/2021    Metabolic Disorder Labs: Lab Results  Component Value Date   HGBA1C 5.3 10/07/2024   MPG 105.41 10/07/2024   MPG 108 12/02/2021   No results found for: PROLACTIN Lab Results  Component Value Date   CHOL 147 10/07/2024   TRIG 97 10/07/2024   HDL 44 10/07/2024   CHOLHDL 3.3 10/07/2024   VLDL 19 10/07/2024   LDLCALC 83 10/07/2024   LDLCALC 84 12/02/2021    Physical Findings: AIMS:  ,  ,  ,  ,  ,  ,   CIWA:    COWS:     Mental Status exam: Appearance: white female of short stature, appropriately groomed in blue scrubs  Eye contact: good  Attitude towards examiner polite, somewhat withdrawn  Psychomotor: no agitaton or retardation  Speech: quiet, soft, normal in prosody  Language: no delays  Mood: okay - still down  Affect: congruent, restricted  Thought content: denying SI and HI today, no delusions expressed  Thought Process: linear and organized on conversation  Perception: denying SI and HI, not RTIS  Insight: fair to good  Judgement: fair to good   Orientation: x3 Attention/Concentration: good - attends to interview  Memory/Cognition: grossly intact on conversation   Progress Energy of Knowledge: Average     Musculoskeletal: Strength & Muscle Tone: within normal limits Gait & Station: normal Patient leans: N/A  Physical Exam Vitals and nursing note reviewed.  Constitutional:      General: She is not in acute distress.    Appearance: Normal appearance. She is normal weight. She is not ill-appearing or toxic-appearing.  HENT:     Head: Normocephalic and atraumatic.  Pulmonary:     Effort: Pulmonary effort is normal.  Musculoskeletal:        General: Normal range of motion.  Neurological:     General: No focal deficit present.     Mental Status: She is alert.    Review of Systems  Respiratory:  Negative for cough and shortness of breath.   Cardiovascular:  Negative for chest pain.  Gastrointestinal:  Negative for abdominal pain, constipation, diarrhea, nausea and vomiting.  Neurological:  Negative for dizziness, weakness and headaches.  Psychiatric/Behavioral:  Positive for depression. Negative for hallucinations and suicidal ideas. The patient is nervous/anxious and has insomnia.    Blood pressure 117/81, pulse 70, temperature 98.1 F (36.7 C), temperature source Oral, resp. rate 18, height 5' 2 (1.575 m), weight 98.3 kg, last menstrual period 10/01/2024, SpO2 98%. Body mass index is 39.65 kg/m.   Treatment Plan Summary: Daily contact with patient to assess and evaluate symptoms and progress in treatment and Medication management  MDD, Recurrent, Severe, w/ Psychotic Features: -Continue Prozac  40 mg daily for depression and anxiety.  -Continue Propanolol 10 mg BID for anxiety -Continue Abilify  10 mg for psychosis and depression -Continue Agitation Protocol: Haldol /Ativan /Benadryl .  -Changed Trazodone  100 mg from prn to routine at 8:00 PM for insomnia.   -One time dose Haldol  2 mg oral   -Continue PRN's: Tylenol , Maalox,  Atarax , Milk of Magnesia, Trazodone    --  The risks/benefits/side-effects/alternatives to medications were discussed in detail with the patient and  time was given for questions. The patient consents to medication trials.                -- Metabolic profile and EKG monitoring obtained while on an atypical antipsychotic (BMI: 39.65, Lipid Panel: Ordered, HbgA1c: Ordered, EKG: NSR w/QTc: 403)              -- Encouraged patient to participate in unit milieu and in scheduled group therapies              -- Short Term Goals: Ability to identify changes in lifestyle to reduce recurrence of condition will improve, Ability to verbalize feelings will improve, Ability to disclose and discuss suicidal ideas, Ability to demonstrate self-control will improve, Ability to identify and develop effective coping behaviors will improve, Ability to maintain clinical measurements within normal limits will improve, Compliance with prescribed medications will improve, and Ability to identify triggers associated with substance abuse/mental health issues will improve             -- Long Term Goals: Improvement in symptoms so as ready for discharge   Safety and Monitoring:             -- Voluntary admission to inpatient psychiatric unit for safety, stabilization and treatment             -- Daily contact with patient to assess and evaluate symptoms and progress in treatment             -- Patient's case to be discussed in multi-disciplinary team meeting             -- Observation Level : q15 minute checks             -- Vital signs:  q12 hours             -- Precautions: suicide, elopement, and assault  Discharge Planning:              -- Social work and case management to assist with discharge planning and identification of hospital follow-up needs prior to discharge             -- Estimated LOS: 4-6 more days             -- Discharge Concerns: Need to establish a safety plan; Medication compliance and effectiveness             -- Discharge Goals: Return home with outpatient referrals for mental health follow-up including medication  management/psychotherapy   Mac Bolster, NP 10/11/2024, 9:54 AM Patient ID: [name], female   DOB: 1981/09/10, 44 y.o.   MRN: [id number]

## 2024-10-11 NOTE — Progress Notes (Signed)
 Tour of Duty:  Prentice JINNY Angle, RN, 10/11/24, Tour of Duty: 0700-1900  SI/HI/AVH: Denies  Self-Reported   Mood: Neutral  Anxiety: Denies Depression: Denies, but Observable Irritability: Denies  Broset  Violence Prevention Guidelines *See Row Information*: Small Violence Risk interventions implemented   LBM  Last BM Date : 10/10/24   Pain: not present  Patient Refusals (including Rx): No  >>Shift Summary: Patient observed to be withdrawn and flat on unit. But patient mood slightly improved overall compared to yesterday. Patient able to make needs known. Patient observed to engage appropriately with staff and peers, but minimal initiation. Patient taking medications as prescribed. This shift, no PRN medication requested or required. No reported or observed side effects to medication. No reported or observed agitation, aggression, or other acute emotional distress. No reported or observed physical abnormalities or concerns.  Last Vitals  Vitals Weight: 98.3 kg Temp: 98.1 F (36.7 C) Temp Source: Oral Pulse Rate: 74 Resp: 18 BP: 109/66 Patient Position: (not recorded)  Admission Type  Psych Admission Type (Psych Patients Only) Admission Status: Voluntary Date 72 hour document signed : (not recorded) Time 72 hour document signed : (not recorded) Provider Notified (First and Last Name) (see details for LINK to note): (not recorded)   Psychosocial Assessment  Psychosocial Assessment Patient Complaints: Depression Eye Contact: Fair Facial Expression: Flat Affect: Depressed, Flat Speech: Soft Interaction: Minimal Motor Activity: Other (Comment) (WDL) Appearance/Hygiene: Unremarkable Behavior Characteristics: Cooperative Mood: Depressed, Pleasant   Aggressive Behavior  Targets: (not recorded)   Thought Process  Thought Process Coherency: Within Defined Limits Content: Within Defined Limits Delusions: None reported or observed Perception: Within Defined  Limits Hallucination: None reported or observed Judgment: Limited Confusion: None  Danger to Self/Others  Danger to Self Current suicidal ideation?: Denies Description of Suicide Plan: (not recorded) Self-Injurious Behavior: (not recorded) Agreement Not to Harm Self: (not recorded) Description of Agreement: (not recorded) Danger to Others: None reported or observed

## 2024-10-11 NOTE — Progress Notes (Signed)
(  Sleep Hours) -7.5 as of 0530 (Any PRNs that were needed, meds refused, or side effects to meds)- @ 2105, hydroxyzine  and trazodone  given (Any disturbances and when (visitation, over night)-none (Concerns raised by the patient)- none (SI/HI/AVH)- denies all

## 2024-10-11 NOTE — Plan of Care (Signed)
   Problem: Education: Goal: Knowledge of [facility] Education information/materials will improve Outcome: Progressing Goal: Emotional status will improve Outcome: Progressing Goal: Mental status will improve Outcome: Progressing Goal: Verbalization of understanding the information provided will improve Outcome: Progressing

## 2024-10-11 NOTE — BH IP Treatment Plan (Signed)
 Interdisciplinary Treatment and Diagnostic Plan Update  10/11/2024 Time of Session: UPDATE Felicia Sutton MRN: [id number]  Principal Diagnosis: MDD (major depressive disorder), recurrent, severe, with psychosis (HCC)  Secondary Diagnoses: Principal Problem:   MDD (major depressive disorder), recurrent, severe, with psychosis (HCC)   Current Medications:  Current Facility-Administered Medications  Medication Dose Route Frequency Provider Last Rate Last Admin   acetaminophen  (TYLENOL ) tablet 650 mg  650 mg Oral Q6H PRN Coleman, Carolyn H, NP   650 mg at 10/05/24 2141   alum & mag hydroxide-simeth (MAALOX/MYLANTA) 200-200-20 MG/5ML suspension 30 mL  30 mL Oral Q4H PRN Coleman, Carolyn H, NP       ARIPiprazole  (ABILIFY ) tablet 10 mg  10 mg Oral Daily Pashayan, Alexander S, DO   10 mg at 10/11/24 0932   haloperidol  (HALDOL ) tablet 5 mg  5 mg Oral TID PRN Mardy Elveria DEL, NP   5 mg at 10/08/24 2102   And   diphenhydrAMINE  (BENADRYL ) capsule 50 mg  50 mg Oral TID PRN Coleman, Carolyn H, NP   50 mg at 10/08/24 2102   haloperidol  lactate (HALDOL ) injection 5 mg  5 mg Intramuscular TID PRN Mardy Elveria DEL, NP       And   diphenhydrAMINE  (BENADRYL ) injection 50 mg  50 mg Intramuscular TID PRN Mardy Elveria DEL, NP       And   LORazepam  (ATIVAN ) injection 2 mg  2 mg Intramuscular TID PRN Mardy Elveria DEL, NP       haloperidol  lactate (HALDOL ) injection 10 mg  10 mg Intramuscular TID PRN Mardy Elveria DEL, NP       And   diphenhydrAMINE  (BENADRYL ) injection 50 mg  50 mg Intramuscular TID PRN Mardy Elveria DEL, NP       And   LORazepam  (ATIVAN ) injection 2 mg  2 mg Intramuscular TID PRN Mardy Elveria DEL, NP       FLUoxetine  (PROZAC ) capsule 40 mg  40 mg Oral Daily Towana Leita SAILOR, MD   40 mg at 10/11/24 9068   hydrOXYzine  (ATARAX ) tablet 25 mg  25 mg Oral TID PRN Coleman, Carolyn H, NP   25 mg at 10/10/24 2104   magnesium  hydroxide (MILK OF MAGNESIA) suspension 30 mL  30 mL Oral Daily  PRN Coleman, Carolyn H, NP       ondansetron  (ZOFRAN -ODT) disintegrating tablet 4 mg  4 mg Oral Q8H PRN Coleman, Carolyn H, NP       propranolol  (INDERAL ) tablet 10 mg  10 mg Oral BID Pashayan, Alexander S, DO   10 mg at 10/11/24 9068   traZODone  (DESYREL ) tablet 100 mg  100 mg Oral QHS PRN Pashayan, Alexander S, DO   100 mg at 10/10/24 2105   PTA Medications: Medications Prior to Admission  Medication Sig Dispense Refill Last Dose/Taking   dicyclomine  (BENTYL ) 20 MG tablet Take 1 tablet (20 mg total) by mouth 2 (two) times daily. (Patient not taking: Reported on 10/04/2024) 20 tablet 0    [EXPIRED] ibuprofen  (ADVIL ) 400 MG tablet Take 1 tablet (400 mg total) by mouth 4 (four) times daily for 5 days. (Patient not taking: Reported on 10/04/2024) 30 tablet 0    oxyCODONE -acetaminophen  (PERCOCET) 5-325 MG tablet Take 1 tablet by mouth every 8 (eight) hours as needed for severe pain (pain score 7-10). (Patient not taking: Reported on 10/04/2024) 10 tablet 0     Patient Stressors:    Patient Strengths:    Treatment Modalities: Medication Management, Group therapy, Case management,  1 to 1 session with clinician, Psychoeducation, Recreational therapy.   Physician Treatment Plan for Primary Diagnosis: MDD (major depressive disorder), recurrent, severe, with psychosis (HCC) Long Term Goal(s): Improvement in symptoms so as ready for discharge   Short Term Goals: Ability to identify changes in lifestyle to reduce recurrence of condition will improve Ability to verbalize feelings will improve Ability to disclose and discuss suicidal ideas Ability to identify and develop effective coping behaviors will improve Ability to identify triggers associated with substance abuse/mental health issues will improve  Medication Management: Evaluate patient's response, side effects, and tolerance of medication regimen.  Therapeutic Interventions: 1 to 1 sessions, Unit Group sessions and Medication  administration.  Evaluation of Outcomes: Progressing  Physician Treatment Plan for Secondary Diagnosis: Principal Problem:   MDD (major depressive disorder), recurrent, severe, with psychosis (HCC)  Long Term Goal(s): Improvement in symptoms so as ready for discharge   Short Term Goals: Ability to identify changes in lifestyle to reduce recurrence of condition will improve Ability to verbalize feelings will improve Ability to disclose and discuss suicidal ideas Ability to identify and develop effective coping behaviors will improve Ability to identify triggers associated with substance abuse/mental health issues will improve     Medication Management: Evaluate patient's response, side effects, and tolerance of medication regimen.  Therapeutic Interventions: 1 to 1 sessions, Unit Group sessions and Medication administration.  Evaluation of Outcomes: Progressing   RN Treatment Plan for Primary Diagnosis: MDD (major depressive disorder), recurrent, severe, with psychosis (HCC) Long Term Goal(s): Knowledge of disease and therapeutic regimen to maintain health will improve  Short Term Goals: Ability to verbalize feelings will improve, Ability to disclose and discuss suicidal ideas, and Ability to identify and develop effective coping behaviors will improve  Medication Management: RN will administer medications as ordered by provider, will assess and evaluate patient's response and provide education to patient for prescribed medication. RN will report any adverse and/or side effects to prescribing provider.  Therapeutic Interventions: 1 on 1 counseling sessions, Psychoeducation, Medication administration, Evaluate responses to treatment, Monitor vital signs and CBGs as ordered, Perform/monitor CIWA, COWS, AIMS and Fall Risk screenings as ordered, Perform wound care treatments as ordered.  Evaluation of Outcomes: Progressing   LCSW Treatment Plan for Primary Diagnosis: MDD (major  depressive disorder), recurrent, severe, with psychosis (HCC) Long Term Goal(s): Safe transition to appropriate next level of care at discharge, Engage patient in therapeutic group addressing interpersonal concerns.  Short Term Goals: Engage patient in aftercare planning with referrals and resources, Increase social support, Increase emotional regulation, and Increase skills for wellness and recovery  Therapeutic Interventions: Assess for all discharge needs, 1 to 1 time with Social worker, Explore available resources and support systems, Assess for adequacy in community support network, Educate family and significant other(s) on suicide prevention, Complete Psychosocial Assessment, Interpersonal group therapy.  Evaluation of Outcomes: Progressing   Progress in Treatment: Attending groups: Yes. Participating in groups: Yes. Taking medication as prescribed: Yes. Toleration medication: Yes. Family/Significant other contact made: No, will contact:  Medford Sauers, husband, [phone], Date and time of first attempt:10/05/2024 at 11:00 AM Patient understands diagnosis: Yes. Discussing patient identified problems/goals with staff: Yes. Medical problems stabilized or resolved: Yes. Denies suicidal/homicidal ideation: Yes. Issues/concerns per patient self-inventory: No.   New problem(s) identified:  No   New Short Term/Long Term Goal(s):      medication stabilization, elimination of SI thoughts, development of comprehensive mental wellness plan.      Patient Goals:  I want to  get better.  I want to fix my medications and go to groups.   Discharge Plan or Barriers:  Patient recently admitted. CSW will continue to follow and assess for appropriate referrals and possible discharge planning.      Reason for Continuation of Hospitalization: Anxiety Depression Hallucinations Medication stabilization   Estimated Length of Stay:  3-5 days  Last 3 [place] Suicide Severity Risk  Score: Flowsheet Row [facility] (Current[facility] Most recent reading at 10/04/2024  9:00 PM ED from 10/04/2024 in [facility] Emergency Department at [facility] Most recent reading at 10/04/2024  7:55 AM ED from 10/01/2024 in [facility] Emergency Department at [facility] Most recent reading at 10/01/2024 12:11 AM  C-SSRS RISK CATEGORY High Risk High Risk No Risk    Last PHQ 2/9 Scores:     No data to display          Scribe for Treatment Team: Jenkins LULLA Primer, [place] 10/11/2024 2:46 PM

## 2024-10-12 ENCOUNTER — Other Ambulatory Visit (HOSPITAL_COMMUNITY): Payer: Self-pay

## 2024-10-12 DIAGNOSIS — F333 Major depressive disorder, recurrent, severe with psychotic symptoms: Secondary | ICD-10-CM | POA: Diagnosis not present

## 2024-10-12 NOTE — Group Note (Signed)
 Recreation Therapy Group Note   Group Topic:Other  Group Date: 10/12/2024 Start Time: 1305 End Time: 1345 Facilitators: Christinia Lambeth-McCall, LRT,CTRS Location: [address] Dayroom   Activity Description/Intervention: Therapeutic Drumming. Patients with peers and staff were given the opportunity to engage in a leader facilitated [facility] Group Empowerment Drumming Circle with staff from the [employer], in partnership with [employer]. Teaching laboratory technician and trained [place], Norleen Mon leading with LRT observing and documenting intervention and pt response. This evidenced-based practice targets 7 areas of health and wellbeing in the human experience including: stress-reduction, exercise, self-expression, camaraderie/support, nurturing, spirituality, and music-making (leisure).   Goal Area(s) Addresses:  Patient will engage in pro-social way in music group.  Patient will follow directions of drum leader on the first prompt. Patient will demonstrate no behavioral issues during group.  Patient will identify if a reduction in stress level occurs as a result of participation in therapeutic drum circle.    Education: Leisure exposure, Pharmacologist, Musical expression, Discharge Planning   Affect/Mood: N/A   Participation Level: Did not attend    Clinical Observations/Individualized Feedback:     Plan: Continue to engage patient in RT group sessions 2-3x/week.   Ettamae Barkett-McCall, LRT,CTRS 10/12/2024 3:29 PM

## 2024-10-12 NOTE — Progress Notes (Signed)
 Tour of Duty:  Prentice JINNY Angle, RN, 10/12/24, Tour of Duty: 0700-1900  SI/HI/AVH: Denies  Self-Reported   Mood: Neutral  Anxiety: Denies Depression: Denies, but Observable Irritability: Denies  Broset  Violence Prevention Guidelines *See Row Information*: Small Violence Risk interventions implemented   LBM  Last BM Date : 10/10/24   Pain: not present  Patient Refusals (including Rx): No  >>Shift Summary: Patient observed to be flat but pleasant on unit. Patient able to make needs known. Patient mood continues to improve. Patient observed to engage appropriately with staff and peers, but minimal initiation. Patient taking medications as prescribed. This shift, no PRN medication requested or required. No reported or observed side effects to medication. No reported or observed agitation, aggression, or other acute emotional distress. No reported or observed physical abnormalities or concerns.  Last Vitals  Vitals Weight: 98.3 kg Temp: 97.7 F (36.5 C) Temp Source: Oral Pulse Rate: 71 Resp: 18 BP: 113/80 Patient Position: (not recorded)  Admission Type  Psych Admission Type (Psych Patients Only) Admission Status: Voluntary Date 72 hour document signed : (not recorded) Time 72 hour document signed : (not recorded) Provider Notified (First and Last Name) (see details for LINK to note): (not recorded)   Psychosocial Assessment  Psychosocial Assessment Patient Complaints: None Eye Contact: Fair Facial Expression: Flat Affect: Depressed, Flat Speech: Soft Interaction: Minimal Motor Activity: Other (Comment) (WDL) Appearance/Hygiene: Unremarkable Behavior Characteristics: Cooperative Mood: Pleasant, Depressed   Aggressive Behavior  Targets: (not recorded)   Thought Process  Thought Process Coherency: Within Defined Limits Content: Within Defined Limits Delusions: None reported or observed Perception: Within Defined Limits Hallucination: None reported or  observed Judgment: Limited Confusion: None  Danger to Self/Others  Danger to Self Current suicidal ideation?: Denies Description of Suicide Plan: (not recorded) Self-Injurious Behavior: (not recorded) Agreement Not to Harm Self: (not recorded) Description of Agreement: (not recorded) Danger to Others: None reported or observed

## 2024-10-12 NOTE — Group Note (Signed)
 Date:  10/12/2024 Time:  2:40 PM  Group Topic/Focus:  Emotional Education:   The focus of this group is to discuss what feelings/emotions are, and how they are experienced. Wellness Toolbox:   The focus of this group is to discuss various aspects of wellness, balancing those aspects and exploring ways to increase the ability to experience wellness.  Patients will create a wellness toolbox for use upon discharge.    Participation Level:  Active   Kirsten Mckone 10/12/2024, 2:40 PM

## 2024-10-12 NOTE — Plan of Care (Signed)

## 2024-10-12 NOTE — Progress Notes (Signed)
 [facility] MD Progress Note  10/12/2024 9:40 AM Felicia Sutton  MRN:  [id number]    Principal Problem: MDD (major depressive disorder), recurrent, severe, with psychosis (HCC) Diagnosis: Principal Problem:   MDD (major depressive disorder), recurrent, severe, with psychosis (HCC)  Identifying Information and Past Psychiatric History:  Felicia Sutton is a 44 yr old female who presented on 1/14 to [facility] with SI due to worsening depression and AH, she was admitted to [facility] on 1/15.  PPHx is significant for MDD, a history of Polysubstance Abuse (THC, Cocaine, EtOH, IV Heroine), 1 Suicide Attempt (OD 10/2021), and 3 Prior Psychiatric Hospitalizations (last-[facility] 04/2023), and no history of Self Injurious Behavior.   Daily notes: Ludivina is seen this morning, chart reviewed. The chart findings dicussed with the treatment team during the team meeting this afternoon.  She presents alert, oriented & aware of situation. She is visible on the unit, attending group sessions. She reports, I'm doing great. I believe being here, taking my medicines & attending group sessions has helped me a lot. I do have a good vibe today. My depression is #1 & anxiety #1. I slept well last night. I think I feel ready to be discharged. I will be going home to my husband. I do not have any complaint today. [name] currently denies SIHI, AVH, delusional thoughts or paranoia. She does not appear to be responding to any internal stimuli. There are no changes made on her current plan of care. Continue as already in progress. There are no behavioral issues reported by staff. Patient's vital signs remain, stable. Continue current plan of care as already in progress.  Past Medical History:  Past Medical History:  Diagnosis Date   GERD (gastroesophageal reflux disease) 2017   Major depression 12/2019    Past Surgical History:  Procedure Laterality Date   CHOLECYSTECTOMY  01/2019   ESOPHAGOGASTRODUODENOSCOPY (EGD) WITH PROPOFOL  N/A  02/01/2020   Procedure: ESOPHAGOGASTRODUODENOSCOPY (EGD) WITH PROPOFOL ;  Surgeon: Legrand Victory LITTIE DOUGLAS, MD;  Location: [facility] ENDOSCOPY;  Service: Gastroenterology;  Laterality: N/A;   Family History: History reviewed. No pertinent family history. Family Psychiatric  History:  Reports No Known Diagnosis', Suicides, or Substance Abuse   Social History:  Social History   Substance and Sexual Activity  Alcohol Use Not Currently     Social History   Substance and Sexual Activity  Drug Use Not Currently    Social History   Socioeconomic History   Marital status: Single    Spouse name: Not on file   Number of children: Not on file   Years of education: Not on file   Highest education level: Not on file  Occupational History   Not on file  Tobacco Use   Smoking status: Never    Passive exposure: Never   Smokeless tobacco: Never  Vaping Use   Vaping status: Never Used  Substance and Sexual Activity   Alcohol use: Not Currently   Drug use: Not Currently   Sexual activity: Not Currently  Other Topics Concern   Not on file  Social History Narrative   ** Merged History Encounter **       Social Drivers of Health   Tobacco Use: Low Risk (10/04/2024)   Patient History    Smoking Tobacco Use: Never    Smokeless Tobacco Use: Never    Passive Exposure: Never  Financial Resource Strain: Not on file  Food Insecurity: No Food Insecurity (10/04/2024)   Epic    Worried About Radiation Protection Practitioner of [employer]  in the Last Year: Never true    Ran Out of Food in the Last Year: Never true  Transportation Needs: No Transportation Needs (10/04/2024)   Epic    Lack of Transportation (Medical): No    Lack of Transportation (Non-Medical): No  Physical Activity: Not on file  Stress: Not on file  Social Connections: Unknown (01/25/2022)   Received from [facility]   Social Network    Social Network: Not on file  Depression [phone]): Not on file  Alcohol Screen: Low Risk (10/04/2024)   Alcohol Screen     Last Alcohol Screening Score (AUDIT): 0  Housing: Low Risk (10/04/2024)   Epic    Unable to Pay for Housing in the Last Year: No    Number of Times Moved in the Last Year: 0    Homeless in the Last Year: No  Utilities: Not At Risk (10/04/2024)   Epic    Threatened with loss of utilities: No  Health Literacy: Not on file    Current Medications: Current Facility-Administered Medications  Medication Dose Route Frequency Provider Last Rate Last Admin   acetaminophen  (TYLENOL ) tablet 650 mg  650 mg Oral Q6H PRN Coleman, Carolyn H, NP   650 mg at 10/05/24 2141   alum & mag hydroxide-simeth (MAALOX/MYLANTA) 200-200-20 MG/5ML suspension 30 mL  30 mL Oral Q4H PRN Coleman, Carolyn H, NP       ARIPiprazole  (ABILIFY ) tablet 10 mg  10 mg Oral Daily Pashayan, Alexander S, DO   10 mg at 10/12/24 9149   haloperidol  (HALDOL ) tablet 5 mg  5 mg Oral TID PRN Coleman, Carolyn H, NP   5 mg at 10/08/24 2102   And   diphenhydrAMINE  (BENADRYL ) capsule 50 mg  50 mg Oral TID PRN Coleman, Carolyn H, NP   50 mg at 10/08/24 2102   haloperidol  lactate (HALDOL ) injection 5 mg  5 mg Intramuscular TID PRN Mardy Elveria DEL, NP       And   diphenhydrAMINE  (BENADRYL ) injection 50 mg  50 mg Intramuscular TID PRN Mardy Elveria DEL, NP       And   LORazepam  (ATIVAN ) injection 2 mg  2 mg Intramuscular TID PRN Mardy Elveria DEL, NP       haloperidol  lactate (HALDOL ) injection 10 mg  10 mg Intramuscular TID PRN Mardy Elveria DEL, NP       And   diphenhydrAMINE  (BENADRYL ) injection 50 mg  50 mg Intramuscular TID PRN Mardy Elveria DEL, NP       And   LORazepam  (ATIVAN ) injection 2 mg  2 mg Intramuscular TID PRN Coleman, Carolyn H, NP       FLUoxetine  (PROZAC ) capsule 40 mg  40 mg Oral Daily Towana Leita SAILOR, MD   40 mg at 10/12/24 9149   hydrOXYzine  (ATARAX ) tablet 25 mg  25 mg Oral TID PRN Coleman, Carolyn H, NP   25 mg at 10/11/24 2105   magnesium  hydroxide (MILK OF MAGNESIA) suspension 30 mL  30 mL Oral Daily PRN Coleman,  Carolyn H, NP       ondansetron  (ZOFRAN -ODT) disintegrating tablet 4 mg  4 mg Oral Q8H PRN Coleman, Carolyn H, NP       propranolol  (INDERAL ) tablet 10 mg  10 mg Oral BID Pashayan, Alexander S, DO   10 mg at 10/12/24 9150   traZODone  (DESYREL ) tablet 100 mg  100 mg Oral QHS Collene Gouge I, NP   100 mg at 10/11/24 2105    Lab Results:  No  results found for this or any previous visit (from the past 48 hours).   Blood Alcohol level:  Lab Results  Component Value Date   ETH <10 11/29/2021   ETH <10 07/05/2021    Metabolic Disorder Labs: Lab Results  Component Value Date   HGBA1C 5.3 10/07/2024   MPG 105.41 10/07/2024   MPG 108 12/02/2021   No results found for: PROLACTIN Lab Results  Component Value Date   CHOL 147 10/07/2024   TRIG 97 10/07/2024   HDL 44 10/07/2024   CHOLHDL 3.3 10/07/2024   VLDL 19 10/07/2024   LDLCALC 83 10/07/2024   LDLCALC 84 12/02/2021    Physical Findings: AIMS:  ,  ,  ,  ,  ,  ,   CIWA:    COWS:     Mental Status exam: Appearance: white female of short stature, appropriately groomed in blue scrubs  Eye contact: good  Attitude towards examiner polite, somewhat withdrawn  Psychomotor: no agitaton or retardation  Speech: quiet, soft, normal in prosody  Language: no delays  Mood: okay - still down  Affect: congruent, restricted  Thought content: denying SI and HI today, no delusions expressed  Thought Process: linear and organized on conversation  Perception: denying SI and HI, not RTIS  Insight: fair to good  Judgement: fair to good   Orientation: x3 Attention/Concentration: good - attends to interview  Memory/Cognition: grossly intact on conversation   Progress Energy of Knowledge: Average    Musculoskeletal: Strength & Muscle Tone: within normal limits Gait & Station: normal Patient leans: N/A  Physical Exam Vitals and nursing note reviewed.  Constitutional:      General: She is not in acute distress.    Appearance: Normal  appearance. She is normal weight. She is not ill-appearing or toxic-appearing.  HENT:     Head: Normocephalic and atraumatic.  Pulmonary:     Effort: Pulmonary effort is normal.  Musculoskeletal:        General: Normal range of motion.  Neurological:     General: No focal deficit present.     Mental Status: She is alert.    Review of Systems  Respiratory:  Negative for cough and shortness of breath.   Cardiovascular:  Negative for chest pain.  Gastrointestinal:  Negative for abdominal pain, constipation, diarrhea, nausea and vomiting.  Neurological:  Negative for dizziness, weakness and headaches.  Psychiatric/Behavioral:  Positive for depression. Negative for hallucinations and suicidal ideas. The patient is nervous/anxious and has insomnia.    Blood pressure 123/86, pulse 71, temperature 97.7 F (36.5 C), temperature source Oral, resp. rate 18, height 5' 2 (1.575 m), weight 98.3 kg, last menstrual period 10/01/2024, SpO2 98%. Body mass index is 39.65 kg/m.   Treatment Plan Summary: Daily contact with patient to assess and evaluate symptoms and progress in treatment and Medication management  MDD, Recurrent, Severe, w/ Psychotic Features: -Continue Prozac  40 mg daily for depression and anxiety.  -Continue Propanolol 10 mg BID for anxiety -Continue Abilify  10 mg for psychosis and depression -Continue Agitation Protocol: Haldol /Ativan /Benadryl .  -Changed Trazodone  100 mg from prn to routine at 8:00 PM for insomnia.   -One time dose Haldol  2 mg oral   -Continue PRN's: Tylenol , Maalox, Atarax , Milk of Magnesia, Trazodone    --  The risks/benefits/side-effects/alternatives to medications were discussed in detail with the patient and time was given for questions. The patient consents to medication trials.                -- Metabolic  profile and EKG monitoring obtained while on an atypical antipsychotic (BMI: 39.65, Lipid Panel: Ordered, HbgA1c: Ordered, EKG: NSR w/QTc: 403)               -- Encouraged patient to participate in unit milieu and in scheduled group therapies              -- Short Term Goals: Ability to identify changes in lifestyle to reduce recurrence of condition will improve, Ability to verbalize feelings will improve, Ability to disclose and discuss suicidal ideas, Ability to demonstrate self-control will improve, Ability to identify and develop effective coping behaviors will improve, Ability to maintain clinical measurements within normal limits will improve, Compliance with prescribed medications will improve, and Ability to identify triggers associated with substance abuse/mental health issues will improve             -- Long Term Goals: Improvement in symptoms so as ready for discharge   Safety and Monitoring:             -- Voluntary admission to inpatient psychiatric unit for safety, stabilization and treatment             -- Daily contact with patient to assess and evaluate symptoms and progress in treatment             -- Patient's case to be discussed in multi-disciplinary team meeting             -- Observation Level : q15 minute checks             -- Vital signs:  q12 hours             -- Precautions: suicide, elopement, and assault  Discharge Planning:              -- Social work and case management to assist with discharge planning and identification of hospital follow-up needs prior to discharge             -- Estimated LOS: 4-6 more days             -- Discharge Concerns: Need to establish a safety plan; Medication compliance and effectiveness             -- Discharge Goals: Return home with outpatient referrals for mental health follow-up including medication management/psychotherapy   Mac Bolster, NP, pmhnp, fnp-bc. 10/12/2024, 9:40 AM Patient ID: [name], female   DOB: 07-11-1981, 44 y.o.   MRN: [id number] Patient ID: Jamesha Ellsworth, female   DOB: [birth year], 44 y.o.   MRN: [id number]

## 2024-10-12 NOTE — Group Note (Signed)
 LCSW Group Therapy Note   Group Date: 10/12/2024 Start Time: 1100 End Time: 1200   Participation:  patient was present.  She listened and was respectful but didn't participate in the discussion.    Type of Therapy:  Group Therapy  Title:  Healing Hearts:  A Safe Space for Grief  Objective:   The objective of this class, Healing Hearts:  A Safe Space for Grief, is to create a compassionate environment where participants can process their grief, explore different stages of grief, and discover ways to honor their loved ones through personal rituals.  3 Goals: Provide a safe and supportive space where participants feel comfortable sharing their feelings and experiences of grief without judgment. Educate participants about the stages of grief and emphasize that there is no right way to grieve or a fixed timeline for healing. Introduce the concept of rituals as a means to process grief, allowing individuals to honor their loved ones in a personal and meaningful way.  Summary:  In Healing Hearts: A Safe Space for Grief, we explored the unique and personal journey of grief, emphasizing that everyone experiences it differently.  We discussed the five stages of grief (denial, anger, bargaining, depression, and acceptance), with the understanding that grief is not linear.  Rituals were introduced as a way to help cope with loss, offering comfort and connection through meaningful actions such as lighting candles or taking memory walks. Participants were encouraged to express their emotions, focus on self-care, and reflect on moments of gratitude for their loved ones, recognizing that healing is a process and there is no timeline for grief.  Therapeutic Modalities: Elements of CBT: Challenge thoughts, reframe beliefs, self-compassion Elements of DBT: Mindfulness, distress tolerance, emotion regulation Supportive Therapy:  Provide validation, foster a safe and supportive group environment, normalize  grief   Ermalee Mealy O Mittie Knittel, LCSWA 10/12/2024  12:21 PM

## 2024-10-12 NOTE — Care Management Important Message (Signed)
 Medicare IM printed and given to social work to give to the patient. ?

## 2024-10-12 NOTE — Group Note (Signed)
 Occupational Therapy Group Note  Group Topic: Sleep Hygiene  Group Date: 10/12/2024 Start Time: 1500 End Time: 1530 Facilitators: Dot Dallas MATSU, OT   Group Description: Group encouraged increased participation and engagement through topic focused on sleep hygiene. Patients reflected on the quality of sleep they typically receive and identified areas that need improvement. Group was given background information on sleep and sleep hygiene, including common sleep disorders. Group members also received information on how to improve ones sleep and introduced a sleep diary as a tool that can be utilized to track sleep quality over a length of time. Group session ended with patients identifying one or more strategies they could utilize or implement into their sleep routine in order to improve overall sleep quality.        Therapeutic Goal(s):  Identify one or more strategies to improve overall sleep hygiene  Identify one or more areas of sleep that are negatively impacted (sleep too much, too little, etc)     Participation Level: Engaged   Participation Quality: Independent   Behavior: Appropriate   Speech/Thought Process: Relevant   Affect/Mood: Appropriate   Insight: Fair   Judgement: Fair      Modes of Intervention: Education  Patient Response to Interventions:  Attentive   Plan: Continue to engage patient in OT groups 2 - 3x/week.  10/12/2024  [place] MATSU Dot, OT  Nyles Mitton, OT

## 2024-10-12 NOTE — Group Note (Signed)
 Date:  10/12/2024 Time:  10:40 AM  Group Topic/Focus:  Making Healthy Choices:   The focus of this group is to help patients identify negative/unhealthy choices they were using prior to admission and identify positive/healthier coping strategies to replace them upon discharge. Wellness Toolbox:   The focus of this group is to discuss various aspects of wellness, balancing those aspects and exploring ways to increase the ability to experience wellness.  Patients will create a wellness toolbox for use upon discharge. Nutrition: How to make healthier choices.     Participation Level:  Active   Felicia Sutton 10/12/2024, 10:40 AM

## 2024-10-12 NOTE — Group Note (Signed)
 Date:  10/12/2024 Time:  9:15 AM  Group Topic/Focus:  Goals Group:   The focus of this group is to help patients establish daily goals to achieve during treatment and discuss how the patient can incorporate goal setting into their daily lives to aide in recovery. Orientation:   The focus of this group is to educate the patient on the purpose and policies of crisis stabilization and provide a format to answer questions about their admission.  The group details unit policies and expectations of patients while admitted.    Participation Level:  Did Not Attend  Felicia Sutton 10/12/2024, 9:15 AM

## 2024-10-12 NOTE — Group Note (Signed)
 Date:  10/12/2024 Time:  2:23 PM  Group Topic/Focus:  Patients participated in a psychoeducational and process-oriented group focused on grief. Group members were encouraged to identify personal experiences of loss, discuss emotional responses related to grief, and explore how grief has impacted their daily functioning. Patients practiced expressing thoughts and feelings in a supportive group setting and identified healthy coping strategies and support systems related to the grieving process. Participation included listening to peers, sharing as able, and engaging in guided discussion.    Participation Level:  Active    Felicia Sutton 10/12/2024, 2:23 PM

## 2024-10-12 NOTE — [facility] Group Notes (Signed)
 [facility] Group Notes:  (Nursing/MHT/Case Management/Adjunct)  Date:  10/12/2024  Time:  10:25 PM  Type of Therapy:  Wrap up group  Participation Level:  Active  Participation Quality:  Appropriate  Affect:  Appropriate  Cognitive:  Appropriate  Insight:  Appropriate  Engagement in Group:  Engaged  Modes of Intervention:  Education  Summary of Progress/Problems:Goal to get D/C, met. Rated day 10/10.  Grayce LITTIE Essex 10/12/2024, 10:25 PM

## 2024-10-12 NOTE — Plan of Care (Signed)
   Problem: Education: Goal: Emotional status will improve Outcome: Progressing Goal: Mental status will improve Outcome: Progressing

## 2024-10-12 NOTE — Progress Notes (Signed)
(  Sleep Hours) -8.25 as of 0530 (Any PRNs that were needed, meds refused, or side effects to meds)- prn hydroxyzine  @ 2105 (Any disturbances and when (visitation, over night)-none (Concerns raised by the patient)- none (SI/HI/AVH)- denies all

## 2024-10-12 NOTE — [facility] Suicide Risk Assessment (Signed)
 [facility] INPATIENT:  Family/Significant Other Suicide Prevention Education  Suicide Prevention Education:  Contact Attempts: Medford Sauers (husband) [phone], (name of family member/significant other) has been identified by the patient as the family member/significant other with whom the patient will be residing, and identified as the person(s) who will aid the patient in the event of a mental health crisis.  With written consent from the patient, two attempts were made to provide suicide prevention education, prior to and/or following the patient's discharge.  We were unsuccessful in providing suicide prevention education.  A suicide education pamphlet was given to the patient to share with family/significant other.  Date and time of first attempt:10/05/24 / 11:00 am Date and time of second attempt:10/12/24 / 2:50 pm  Louetta Lame 10/12/2024, 2:51 PM

## 2024-10-13 DIAGNOSIS — F333 Major depressive disorder, recurrent, severe with psychotic symptoms: Secondary | ICD-10-CM | POA: Diagnosis not present

## 2024-10-13 MED ORDER — TRAZODONE HCL 100 MG PO TABS: 100.0000 mg | ORAL_TABLET | Freq: Every day | ORAL | 0 refills | Status: AC

## 2024-10-13 MED ORDER — PROPRANOLOL HCL 10 MG PO TABS: 10.0000 mg | ORAL_TABLET | Freq: Two times a day (BID) | ORAL | 0 refills | Status: AC

## 2024-10-13 MED ORDER — ARIPIPRAZOLE 10 MG PO TABS: 10.0000 mg | ORAL_TABLET | Freq: Every day | ORAL | 0 refills | Status: AC

## 2024-10-13 MED ORDER — HYDROXYZINE HCL 25 MG PO TABS: 25.0000 mg | ORAL_TABLET | Freq: Three times a day (TID) | ORAL | 0 refills | Status: AC | PRN

## 2024-10-13 MED ORDER — FLUOXETINE HCL 40 MG PO CAPS: 40.0000 mg | ORAL_CAPSULE | Freq: Every day | ORAL | 0 refills | Status: AC

## 2024-10-13 NOTE — [facility] Suicide Risk Assessment (Signed)
 Suicide Risk Assessment  Discharge Assessment    [facility] Discharge Suicide Risk Assessment   Principal Problem: MDD (major depressive disorder), recurrent, severe, with psychosis (HCC)  Discharge Diagnoses: Principal Problem:   MDD (major depressive disorder), recurrent, severe, with psychosis (HCC)  Total Time spent with patient: 45 minutes  Musculoskeletal: Strength & Muscle Tone: within normal limits Gait & Station: normal Patient leans: N/A  Psychiatric Specialty Exam  Presentation  General Appearance:  Appropriate for Environment; Casual; Fairly Groomed  Eye Contact: Good  Speech: Clear and Coherent  Speech Volume: Normal  Handedness: Right   Mood and Affect  Mood: Euthymic  Duration of Depression Symptoms: No data recorded Affect: Appropriate; Congruent   Thought Process  Thought Processes: Coherent; Goal Directed; Linear  Descriptions of Associations:Intact  Orientation:Full (Time, Place and Person)  Thought Content:Logical  History of Schizophrenia/Schizoaffective disorder: NA  Duration of Psychotic Symptoms: NA  Hallucinations:Hallucinations: None Description of Auditory Hallucinations: NA  Ideas of Reference:None  Suicidal Thoughts:Suicidal Thoughts: No  Homicidal Thoughts:Homicidal Thoughts: No   Sensorium  Memory: Immediate Good; Recent Good; Remote Good  Judgment: Good  Insight: Good   Executive Functions  Concentration: Good  Attention Span: Good  Recall: Good  Fund of Knowledge: Fair  Language: Good  Psychomotor Activity  Psychomotor Activity:Psychomotor Activity: Normal   Assets  Assets: Communication Skills; Desire for Improvement; Financial Resources/Insurance; Housing; Resilience; Social Support; Physical Health   Sleep  Sleep:Sleep: Good  Estimated Sleeping Duration (Last 24 Hours): 7.00-8.00 hours  Physical Exam: See the discharge summary.  Blood pressure 118/83, pulse 65, temperature  98.2 F (36.8 C), temperature source Oral, resp. rate 18, height 5' 2 (1.575 m), weight 98.3 kg, last menstrual period 10/01/2024, SpO2 98%. Body mass index is 39.65 kg/m.  Mental Status Per Nursing Assessment::   On Admission:  Suicidal ideation indicated by patient  Demographic Factors:  Adolescent or young adult and Caucasian  Loss Factors: NA  Historical Factors: NA  Risk Reduction Factors:   Sense of responsibility to family, Living with another person, especially a relative, Positive social support, Positive therapeutic relationship, and Positive coping skills or problem solving skills  Continued Clinical Symptoms:  Depression:   Comorbid alcohol abuse/dependence Previous Psychiatric Diagnoses and Treatments  Cognitive Features That Contribute To Risk:  None    Suicide Risk:  Minimal: No identifiable suicidal ideation.  Patients presenting with no risk factors but with morbid ruminations; may be classified as minimal risk based on the severity of the depressive symptoms   Follow-up Information     [facility] Follow up on 10/19/2024.   Why: You have a hospital follow up appointment for therapy and medication management services on 10/19/24 at 8:30 am. The appointment will be Virtual, telehealth. Contact information: [address]  [address] [place] [address] [phone]                 Plan Of Care/Follow-up recommendations:  See the discharge recommendation above.  Mac Bolster, NP, pmhnp, fnp-bc. 10/13/2024, 10:41 AM

## 2024-10-13 NOTE — Plan of Care (Signed)
" °  Problem: Education: Goal: Mental status will improve 10/13/2024 0955 by Cresenciano Joaquin KIDD, RN Outcome: Completed/Met 10/13/2024 0955 by Cresenciano Joaquin KIDD, RN Outcome: Progressing   Problem: Education: Goal: Verbalization of understanding the information provided will improve 10/13/2024 0955 by Cresenciano Joaquin KIDD, RN Outcome: Completed/Met 10/13/2024 0955 by Cresenciano Joaquin KIDD, RN Outcome: Progressing   Problem: Activity: Goal: Interest or engagement in activities will improve 10/13/2024 0955 by Cresenciano Joaquin KIDD, RN Outcome: Completed/Met 10/13/2024 0955 by Cresenciano Joaquin KIDD, RN Outcome: Progressing   "

## 2024-10-13 NOTE — [facility] Suicide Risk Assessment (Signed)
 [facility] INPATIENT:  Family/Significant Other Suicide Prevention Education  Suicide Prevention Education was reviewed thoroughly with patient, including risk factors, warning signs, and what to do. Mobile Crisis services were described and that telephone number pointed out, with encouragement to patient to put this number in personal cell phone. Brochure was provided to patient to share with natural supports. Patient acknowledged the ways in which they are at risk, and how working through each of their issues can gradually start to reduce their risk factors. Patient was encouraged to think of the information in the context of people in their own lives. Patient denied having access to firearms Patient verbalized understanding of information provided. Patient endorsed a desire to live.    SIGNED: Maria Coin Nunez-Uva, LCSW-A

## 2024-10-13 NOTE — Progress Notes (Signed)
" °  [facility] Adult Case Management Discharge Plan :  Will you be returning to the same living situation after discharge:  Yes,  patient will be returning home to address on file.   At discharge, do you have transportation home?: No. CSW arranged taxi voucher through [employer] for 11:00am.  Do you have the ability to pay for your medications: Yes,  patient has active health insurance.   Release of information consent forms completed and in the chart;  Patient's signature needed at discharge.  Patient to Follow up at:  Follow-up Information     [facility] Follow up on 10/19/2024.   Why: You have a hospital follow up appointment for therapy and medication management services on 10/19/24 at 8:30 am. The appointment will be Virtual, telehealth. Contact information: [address]  [address] [place] [address] [phone]                 Next level of care provider has access to [facility] Link:no  Safety Planning and Suicide Prevention discussed: Yes,  completed with patient. Suicide Prevention Education was reviewed thoroughly with patient, including risk factors, warning signs, and what to do. Mobile Crisis services were described and that telephone number pointed out, with encouragement to patient to put this number in personal cell phone. Brochure was provided to patient to share with natural supports. Patient acknowledged the ways in which they are at risk, and how working through each of their issues can gradually start to reduce their risk factors. Patient was encouraged to think of the information in the context of people in their own lives. Patient denied having access to firearms Patient verbalized understanding of information provided. Patient endorsed a desire to live.       Has patient been referred to the Quitline?: Patient does not use tobacco/nicotine products  Patient has been referred for addiction treatment: Patient refused referral for treatment.  Louetta Lame, LCSWA 10/13/2024, 10:44 AM "

## 2024-10-13 NOTE — Group Note (Signed)
 Date:  10/13/2024 Time:  10:35 AM  Group Topic/Focus:  The Recreation Therapist facilitated a therapeutic activity group focused on promoting social interaction, emotional regulation, and positive leisure skills. Patients were encouraged to participate at their own comfort level while practicing appropriate communication, cooperation, and coping strategies. The activity provided an opportunity for stress reduction, engagement, and expression in a structured and supportive environment.  Participation Level:  Active  Felicia Sutton 10/13/2024, 10:35 AM

## 2024-10-13 NOTE — Plan of Care (Signed)
  Problem: Education: Goal: Verbalization of understanding the information provided will improve Outcome: Progressing   Problem: Activity: Goal: Interest or engagement in activities will improve Outcome: Progressing   Problem: Coping: Goal: Ability to verbalize frustrations and anger appropriately will improve Outcome: Progressing   

## 2024-10-13 NOTE — Group Note (Signed)
 Date:  10/13/2024 Time:  9:19 AM  Group Topic/Focus:  Goals Group:   The focus of this group is to help patients establish daily goals to achieve during treatment and discuss how the patient can incorporate goal setting into their daily lives to aide in recovery. Orientation:   The focus of this group is to educate the patient on the purpose and policies of crisis stabilization and provide a format to answer questions about their admission.  The group details unit policies and expectations of patients while admitted.    Participation Level:  Active  Participation Quality:  Appropriate  Affect:  Appropriate  Cognitive:  Appropriate  Insight: Appropriate  Engagement in Group:  Engaged  Modes of Intervention:  Activity  Additional Comments:  Pt attended and participated.    Tyner Codner 10/13/2024, 9:19 AM

## 2024-10-13 NOTE — Discharge Summary (Signed)
 " Physician Discharge Summary Note  Patient:  Felicia Sutton is an 44 y.o., female MRN:  [id number] DOB:  1980/12/22 Patient phone:  [phone] (home)  Patient address:   [address] [place] [address],  Total Time spent with patient: 45 minutes  Date of Admission:  10/04/2024  Date of Discharge: 10-13-24.  Reason for Admission: Suicidal ideations due to worsening depression & auditory hallucinations.  Principal Problem: MDD (major depressive disorder), recurrent, severe, with psychosis (HCC) Discharge Diagnoses: Principal Problem:   MDD (major depressive disorder), recurrent, severe, with psychosis (HCC)  Past Psychiatric History: Major depressive disorder, recurrent episodes with psychosis.  Past Medical History:  Past Medical History:  Diagnosis Date   GERD (gastroesophageal reflux disease) 2017   Major depression 12/2019    Past Surgical History:  Procedure Laterality Date   CHOLECYSTECTOMY  01/2019   ESOPHAGOGASTRODUODENOSCOPY (EGD) WITH PROPOFOL  N/A 02/01/2020   Procedure: ESOPHAGOGASTRODUODENOSCOPY (EGD) WITH PROPOFOL ;  Surgeon: Legrand Victory LITTIE DOUGLAS, MD;  Location: [facility] ENDOSCOPY;  Service: Gastroenterology;  Laterality: N/A;   Family History: History reviewed. No pertinent family history.  Family Psychiatric  History: See H&P.  Social History:  Social History   Substance and Sexual Activity  Alcohol Use Not Currently     Social History   Substance and Sexual Activity  Drug Use Not Currently    Social History   Socioeconomic History   Marital status: Single    Spouse name: Not on file   Number of children: Not on file   Years of education: Not on file   Highest education level: Not on file  Occupational History   Not on file  Tobacco Use   Smoking status: Never    Passive exposure: Never   Smokeless tobacco: Never  Vaping Use   Vaping status: Never Used  Substance and Sexual Activity   Alcohol use: Not Currently   Drug use: Not Currently    Sexual activity: Not Currently  Other Topics Concern   Not on file  Social History Narrative   ** Merged History Encounter **       Social Drivers of Health   Tobacco Use: Low Risk (10/04/2024)   Patient History    Smoking Tobacco Use: Never    Smokeless Tobacco Use: Never    Passive Exposure: Never  Financial Resource Strain: Not on file  Food Insecurity: No Food Insecurity (10/04/2024)   Epic    Worried About Programme Researcher, Broadcasting/film/video in the Last Year: Never true    Ran Out of Food in the Last Year: Never true  Transportation Needs: No Transportation Needs (10/04/2024)   Epic    Lack of Transportation (Medical): No    Lack of Transportation (Non-Medical): No  Physical Activity: Not on file  Stress: Not on file  Social Connections: Unknown (01/25/2022)   Received from [facility]   Social Network    Social Network: Not on file  Depression [phone]): Not on file  Alcohol Screen: Low Risk (10/04/2024)   Alcohol Screen    Last Alcohol Screening Score (AUDIT): 0  Housing: Low Risk (10/04/2024)   Epic    Unable to Pay for Housing in the Last Year: No    Number of Times Moved in the Last Year: 0    Homeless in the Last Year: No  Utilities: Not At Risk (10/04/2024)   Epic    Threatened with loss of utilities: No  Health Literacy: Not on file   Hospital Course: (Per admission evaluation notes): 43  yr old female who presented on 1/14 to [facility] with SI due to worsening depression and AH, she was admitted to [facility] on 1/15.  PPHx is significant for MDD, a history of Polysubstance Abuse (THC, Cocaine, EtOH, IV Heroine), 1 Suicide Attempt (OD 10/2021), and 3 Prior Psychiatric Hospitalizations (last-[facility] 04/2023), and no history of Self Injurious Behavior.   Prior to this discharge, Felicia Sutton was seen & evaluated for mental health stability by her treatment team. The current laboratory findings were reviewed (stable), nurses notes & vital signs were reviewed as well. There are no current mental  health or medical issues that should prevent this discharge at this time. Patient is being discharged to continue mental health care as noted below.   After the above admission evaluation, Felicia Sutton's presenting symptoms were noted. She was recommended for mood stabilization treatments by her treatment team. The medication regimen targeting those presenting symptoms were discussed & initiated with her plan of care discussed with her. She did consent to treatment. She was treated, stabilized & discharged on the medications as listed on her discharge medication lists below. Besides the mood stabilization treatments, patient was was also enrolled & participated in the group counseling sessions being offered & held on this unit. She learned coping skills. She presented no other significant pre-existing medical issues that required treatment. She tolerated her treatment regimen without any adverse effects or reactions reported.   Felicia Sutton's symptoms responded well to her treatment regimen warranting this discharge. Patient is also mentally/medically stable & agreeable to this discharge. During the course of this this hospitalization, there were no instances of behavioral issues that required restraints or immediate intervention displayed by the patient. Felicia Sutton remained safe on the unit. There were no instances of self-harming behavior reported by staff. There were no threats to other patients/staff. Felicia Sutton remained cooperative to her daily routines & in taking her treatment regimen as recommended. She participated in the group sessions and interacted with the staff and the other patients appropriately.   Over the course of this hospitalization, patient's symptoms responded well to her treatment regimen & mood has improved. She was able to maintain her personal hygiene through this hospitalization. On this day of her hospital discharge, patient denies any thoughts of self-harm, suicidal/homicidal ideations. She  is not observed to be responding to internal any internal stimuli. She has been compliant with her recommended treatment regimen. She is currently showing readiness for discharge & is future-oriented thinking. Patient is encouraged to keep all psychiatric appointments and continue with his medications as prescribed. She was able to engage in safety planning including plan to return to [facility] or contact emergency services if she feels unable to maintain her own safety or the safety of others. Patient had no further questions, comments, or concerns. She left [facility] with all personal belongings in no apparent distress. Transportation per taxi. [facility] assisted with taxi voucher.    Physical Findings: AIMS:  , ,  ,  ,  ,  ,   CIWA:    COWS:     Musculoskeletal: Strength & Muscle Tone: within normal limits Gait & Station: normal Patient leans: N/A   Psychiatric Specialty Exam:  Presentation  General Appearance:  Appropriate for Environment; Casual; Fairly Groomed  Eye Contact: Good  Speech: Clear and Coherent  Speech Volume: Normal  Handedness: Right   Mood and Affect  Mood: Euthymic  Affect: Appropriate; Congruent   Thought Process  Thought Processes: Coherent; Goal Directed; Linear  Descriptions of Associations:Intact  Orientation:Full (Time,  Place and Person)  Thought Content:Logical  History of Schizophrenia/Schizoaffective disorder: NA  Duration of Psychotic Symptoms: NA  Hallucinations:Hallucinations: None Description of Auditory Hallucinations: NA  Ideas of Reference:None  Suicidal Thoughts:Suicidal Thoughts: No  Homicidal Thoughts:Homicidal Thoughts: No  Sensorium  Memory: Immediate Good; Recent Good; Remote Good  Judgment: Good  Insight: Good  Executive Functions  Concentration: Good  Attention Span: Good  Recall: Good  Fund of Knowledge: Fair  Language: Good  Psychomotor Activity  Psychomotor Activity: Psychomotor Activity:  Normal  Assets  Assets: Communication Skills; Desire for Improvement; Financial Resources/Insurance; Housing; Resilience; Social Support; Physical Health  Sleep  Sleep: Sleep: Good  Estimated Sleeping Duration (Last 24 Hours): 7.00-8.00 hours   Physical Exam: Physical Exam Vitals and nursing note reviewed.  HENT:     Head: Normocephalic.     Nose: Nose normal.     Mouth/Throat:     Pharynx: Oropharynx is clear.  Cardiovascular:     Rate and Rhythm: Normal rate.     Pulses: Normal pulses.  Pulmonary:     Effort: Pulmonary effort is normal.  Genitourinary:    Comments: Deferred. Musculoskeletal:        General: Normal range of motion.     Cervical back: Normal range of motion.  Skin:    General: Skin is dry.  Neurological:     General: No focal deficit present.     Mental Status: She is alert and oriented to person, place, and time. Mental status is at baseline.    Review of Systems  Constitutional:  Negative for chills, diaphoresis and fever.  HENT:  Negative for congestion and sore throat.   Respiratory:  Negative for cough, shortness of breath and wheezing.   Cardiovascular:  Negative for chest pain and palpitations.  Gastrointestinal:  Negative for abdominal pain, constipation, diarrhea, heartburn, nausea and vomiting.  Genitourinary:  Negative for dysuria.  Musculoskeletal:  Negative for joint pain and myalgias.  Skin:  Negative for itching and rash.  Neurological:  Negative for dizziness, tingling, tremors, sensory change, speech change, focal weakness, seizures, loss of consciousness, weakness and headaches.  Endo/Heme/Allergies:        NKDA.  Psychiatric/Behavioral:  Positive for depression (Hx of. Stable on medication.) and substance abuse (Hx . THC use.). Negative for hallucinations, memory loss and suicidal ideas. The patient has insomnia (Hx of (stable on medication).). The patient is not nervous/anxious (Stable upon discharge.).    Blood pressure  118/83, pulse 65, temperature 98.2 F (36.8 C), temperature source Oral, resp. rate 18, height 5' 2 (1.575 m), weight 98.3 kg, last menstrual period 10/01/2024, SpO2 98%. Body mass index is 39.65 kg/m.   Tobacco Use History[1] Tobacco Cessation:  N/A, patient does not currently use tobacco products  Blood Alcohol level:  Lab Results  Component Value Date   ETH <10 11/29/2021   ETH <10 07/05/2021   Metabolic Disorder Labs:  Lab Results  Component Value Date   HGBA1C 5.3 10/07/2024   MPG 105.41 10/07/2024   MPG 108 12/02/2021   No results found for: PROLACTIN Lab Results  Component Value Date   CHOL 147 10/07/2024   TRIG 97 10/07/2024   HDL 44 10/07/2024   CHOLHDL 3.3 10/07/2024   VLDL 19 10/07/2024   LDLCALC 83 10/07/2024   LDLCALC 84 12/02/2021   See Psychiatric Specialty Exam and Suicide Risk Assessment completed by Attending Physician prior to discharge.  Discharge destination:  Home  Is patient on multiple antipsychotic therapies at discharge:  No  Has Patient had three or more failed trials of antipsychotic monotherapy by history:  No  Recommended Plan for Multiple Antipsychotic Therapies: NA  Allergies as of 10/13/2024   No Known Allergies      Medication List     STOP taking these medications    dicyclomine  20 MG tablet Commonly known as: BENTYL    ibuprofen  400 MG tablet Commonly known as: ADVIL    oxyCODONE -acetaminophen  5-325 MG tablet Commonly known as: Percocet       TAKE these medications      Indication  ARIPiprazole  10 MG tablet Commonly known as: ABILIFY  Take 1 tablet (10 mg total) by mouth daily. For mood control. Start taking on: October 14, 2024  Indication: Mood control.   FLUoxetine  40 MG capsule Commonly known as: PROZAC  Take 1 capsule (40 mg total) by mouth daily. For depression. Start taking on: October 14, 2024  Indication: Major Depressive Disorder   hydrOXYzine  25 MG tablet Commonly known as: ATARAX  Take 1  tablet (25 mg total) by mouth 3 (three) times daily as needed for anxiety.  Indication: Feeling Anxious   propranolol  10 MG tablet Commonly known as: INDERAL  Take 1 tablet (10 mg total) by mouth 2 (two) times daily. For anxiety.  Indication: Feeling Anxious   traZODone  100 MG tablet Commonly known as: DESYREL  Take 1 tablet (100 mg total) by mouth at bedtime. For sleep.  Indication: Trouble Sleeping        Follow-up Information     [facility] Follow up on 10/19/2024.   Why: You have a hospital follow up appointment for therapy and medication management services on 10/19/24 at 8:30 am. The appointment will be Virtual, telehealth. Contact information: [address]  [address] [place] [address] [phone]                 Plan Of Care/Follow-up recommendations:  Activity: as tolerated  Diet: heart healthy  Other: -Follow-up with your outpatient psychiatric provider -instructions on appointment date, time, and address (location) are provided to you in discharge paperwork.  -Take your psychiatric medications as prescribed at discharge - instructions are provided to you in the discharge paperwork  -Follow-up with outpatient primary care doctor and other specialists -for management of preventative medicine and chronic medical issues  -Testing: Follow-up with outpatient provider for abnormal lab results: NA  -If you are prescribed an atypical antipsychotic medication, we recommend that your outpatient psychiatrist follow routine screening for side effects within 3 months of discharge, including monitoring: AIMS scale, height, weight, blood pressure, fasting lipid panel, HbA1c, and fasting blood sugar.   -Recommend total abstinence from alcohol, tobacco, and other illicit drug use at discharge.   -If your psychiatric symptoms recur, worsen, or if you have side effects to your psychiatric medications, call your outpatient psychiatric provider, 911, [phone] or go to the  nearest emergency department.  -If suicidal thoughts occur, immediately call your outpatient psychiatric provider, 911, [phone] or go to the nearest emergency department.  Signed: Mac Bolster, NP, pmhnp, fnp-bc. 10/13/2024, 10:42 AM           [1]  Social History Tobacco Use  Smoking Status Never   Passive exposure: Never  Smokeless Tobacco Never   "

## 2024-10-13 NOTE — Progress Notes (Signed)
(  Sleep Hours) -6.5 (Any PRNs that were needed, meds refused, or side effects to meds)- hydroxyzine  (Any disturbances and when (visitation, over night)-none (Concerns raised by the patient)- none (SI/HI/AVH)-denies all

## 2024-10-13 NOTE — Transportation (Signed)
 10/13/2024  [name] DOB: 1981/02/02 MRN: [id number]   RIDER WAIVER AND RELEASE OF LIABILITY  For the purposes of helping with transportation needs, [facility] partners with outside transportation providers (taxi companies, [place], catering manager.) to give [facility] patients or other approved people the choice of on-demand rides Public Librarian) to our buildings for non-emergency visits.  By using [employer], I, the person signing this document, on behalf of myself and/or any legal minors (in my care using the [employer]), agree:  Science Writer given to me are supplied by independent, outside transportation providers who do not work for, or have any affiliation with, [employer]. [place] is not a transportation company. [place] has no control over the quality or safety of the rides I get using [employer]. [place] has no control over whether any outside ride will happen on time or not. [place] gives no guarantee on the reliability, quality, safety, or availability on any rides, or that no mistakes will happen. I know and accept that traveling by vehicle (car, truck, SVU, fleeta, bus, taxi, etc.) has risks of serious injuries such as disability, being paralyzed, and death. I know and agree the risk of using [employer] is mine alone, and not [employer]. [employer] are provided as is and as are available. The transportation providers are in charge for all inspections and care of the vehicles used to provide these rides. I agree not to take legal action against [place], its agents, employees, officers, directors, representatives, insurers, attorneys, assigns, successors, subsidiaries, and affiliates at any time for any reasons related directly or indirectly to using [employer]. I also agree not to take legal action against [place] or its affiliates for any injury, death, or damage to property caused by or related to using  [employer]. I have read this Waiver and Release of Liability, and I understand the terms used in it and their legal meaning. This Waiver is freely and voluntarily given with the understanding that my right (or any legal minors) to legal action against  relating to [employer] is knowingly given up to use these services.   I attest that I read the Ride Waiver and Release of Liability to [name], gave Ms. Jasmer the opportunity to ask questions and answered the questions asked (if any). I affirm that [name] then provided consent for assistance with transportation.

## 2024-10-13 NOTE — Group Note (Signed)
 Date:  10/13/2024 Time:  1:08 PM  Group Topic/Focus:  Group Description: The Recreation Therapist facilitated a Social Wellness group focused on increasing awareness of healthy social interactions, communication, boundaries, and shared experiences. Patients participated in an interactive activity that encouraged self-reflection, peer engagement, and recognition of common social challenges. The group promoted connection, respect, and appropriate social behaviors in a structured and supportive environment.   Participation Level:  Active  Participation Quality:  Appropriate  Affect:  Appropriate  Cognitive:  Appropriate  Insight: Appropriate  Engagement in Group:  Engaged  Modes of Intervention:  Activity  Additional Comments:  Pt attended and participated.  Felicia Sutton 10/13/2024, 1:08 PM

## 2024-10-13 NOTE — Progress Notes (Signed)
 Felicia Sutton  D/C'd Home per MD order.  Discussed with the patient and all questions fully answered.  An After Visit Summary was printed and given to the patient. Patient received prescription.  D/c education completed with patient including follow up instructions, medication list, d/c activities limitations if indicated, with other d/c instructions as indicated by MD - patient able to verbalize understanding, all questions fully answered.   Patient instructed to return to ED, call 911, or call MD for any changes in condition.   Patient escorted to the lobby, and D/C home via taxi.  Joaquin MALVA Doing 10/13/2024 11:39 AM

## 2024-10-14 ENCOUNTER — Emergency Department (HOSPITAL_COMMUNITY)
Admission: EM | Admit: 2024-10-14 | Discharge: 2024-10-14 | Disposition: A | Discharge status: Home / Self Care | Attending: Student in an Organized Health Care Education/Training Program | Admitting: Student in an Organized Health Care Education/Training Program

## 2024-10-14 ENCOUNTER — Encounter (HOSPITAL_COMMUNITY): Payer: Self-pay | Admitting: *Deleted

## 2024-10-14 ENCOUNTER — Other Ambulatory Visit: Payer: Self-pay

## 2024-10-14 ENCOUNTER — Emergency Department (HOSPITAL_COMMUNITY)

## 2024-10-14 DIAGNOSIS — R079 Chest pain, unspecified: Secondary | ICD-10-CM | POA: Diagnosis present

## 2024-10-14 DIAGNOSIS — J111 Influenza due to unidentified influenza virus with other respiratory manifestations: Secondary | ICD-10-CM

## 2024-10-14 DIAGNOSIS — M791 Myalgia, unspecified site: Secondary | ICD-10-CM | POA: Insufficient documentation

## 2024-10-14 DIAGNOSIS — R059 Cough, unspecified: Secondary | ICD-10-CM | POA: Insufficient documentation

## 2024-10-14 DIAGNOSIS — R519 Headache, unspecified: Secondary | ICD-10-CM | POA: Diagnosis not present

## 2024-10-14 DIAGNOSIS — R0981 Nasal congestion: Secondary | ICD-10-CM | POA: Diagnosis not present

## 2024-10-14 LAB — BASIC METABOLIC PANEL WITH GFR
Anion gap: 11 (ref 5–15)
BUN: 15 mg/dL (ref 6–20)
CO2: 27 mmol/L (ref 22–32)
Calcium: 9 mg/dL (ref 8.9–10.3)
Chloride: 102 mmol/L (ref 98–111)
Creatinine, Ser: 0.63 mg/dL (ref 0.44–1.00)
GFR, Estimated: >60 mL/min
Glucose, Bld: 95 mg/dL (ref 70–99)
Potassium: 4.2 mmol/L (ref 3.5–5.1)
Sodium: 140 mmol/L (ref 135–145)

## 2024-10-14 LAB — TROPONIN T, HIGH SENSITIVITY
Troponin T High Sensitivity: <6 ng/L (ref 0–19)
Troponin T High Sensitivity: <6 ng/L (ref 0–19)

## 2024-10-14 LAB — CBC
HCT: 38.5 % (ref 36.0–46.0)
Hemoglobin: 12.5 g/dL (ref 12.0–15.0)
MCH: 29.5 pg (ref 26.0–34.0)
MCHC: 32.5 g/dL (ref 30.0–36.0)
MCV: 90.8 fL (ref 80.0–100.0)
Platelets: 264 10*3/uL (ref 150–400)
RBC: 4.24 MIL/uL (ref 3.87–5.11)
RDW: 12.5 % (ref 11.5–15.5)
WBC: 9.7 10*3/uL (ref 4.0–10.5)
nRBC: 0 % (ref 0.0–0.2)

## 2024-10-14 LAB — HCG, SERUM, QUALITATIVE: Preg, Serum: NEGATIVE

## 2024-10-14 MED ORDER — IBUPROFEN 400 MG PO TABS
600.0000 mg | ORAL_TABLET | Freq: Once | ORAL | Status: AC
  Administered 2024-10-14: 600 mg via ORAL
  Filled 2024-10-14: qty 1

## 2024-10-14 MED ORDER — ONDANSETRON 4 MG PO TBDP
4.0000 mg | ORAL_TABLET | Freq: Once | ORAL | Status: AC
  Administered 2024-10-14: 4 mg via ORAL
  Filled 2024-10-14: qty 1

## 2024-10-14 MED ORDER — FAMOTIDINE 20 MG PO TABS
20.0000 mg | ORAL_TABLET | Freq: Once | ORAL | Status: AC
  Administered 2024-10-14: 20 mg via ORAL
  Filled 2024-10-14: qty 1

## 2024-10-14 MED ORDER — ONDANSETRON HCL 4 MG PO TABS: 4.0000 mg | ORAL_TABLET | Freq: Three times a day (TID) | ORAL | Status: AC | PRN

## 2024-10-14 MED ORDER — FAMOTIDINE 20 MG PO TABS: 20.0000 mg | ORAL_TABLET | Freq: Two times a day (BID) | ORAL | 0 refills | Status: AC

## 2024-10-14 NOTE — Discharge Instructions (Signed)
 You were evaluated in the emergency department today for flu-like illness  Based on your evaluation, there is no evidence of a serious or life-threatening condition at this time.  However, we recommend that you continue closely monitoring your symptoms and arrange close follow-up with your primary care provider for reevaluation.  Your symptoms may improve on their own, but we recommend follow-up or return to the emergency department if they continue/worsen.  Follow-up: - Follow-up with your primary care provider or a specialist if your symptoms persist, worsen, or you have additional concerns.   - We typically recommend follow-up with your PCP within the next 1-3 days, unless directed otherwise by your medical provider. - If a follow-up appointment has already been scheduled, we recommend you continue to keep that appointment to discuss your recent ED visit  Return to the emergency department if you experience: - Worsening or new symptoms - New fever or fever that persists - Difficulty breathing, chest pain, severe pain, or weakness. - Signs of infection at any wound site (increased redness, warmth, discharge) - You have any other concerns or unexpected changes  Medications: - New medications prescribed by the ED: pepcid , zofran  - Pain relief: Use over-the-counter pain relievers like ibuprofen  or Tylenol  as directed on the package.  Talk to your medical provider if you have certain medical conditions like kidney disease or liver disease to make sure these medications are safe for you to use. - Continue to take all your medications as directed.  Contact your primary care physician (or seek medical care from a medical provider) if you have any side effects or questions  Other instructions: - Drink plenty of fluids to stay hydrated (we recommend water or fluids that contain electrolytes including Gatorade/Powerade/Pedialyte) - Continue to rest and care for yourself at home - Avoid strenuous  activity if feeling unwell - If applicable: consider applying ice packs or a heating pad to the affected area of injury for 20 minutes at a time, 4-5 times a day, for the next 24-48 hours.   Once again, we highly encourage you to contact your primary care provider or return to the emergency department if you have any further concerns or questions.

## 2024-10-14 NOTE — ED Provider Notes (Signed)
 " [facility] Provider Note   CSN: [id number] Arrival date & time: 10/14/24  9983     Patient presents with: Generalized Body Aches, Chest Pain, and Headache   Felicia Sutton is a 44 y.o. female.  {Add pertinent medical, surgical, social history, OB history to HPI:32947}  Chest Pain Associated symptoms: headache   Headache      Prior to Admission medications  Medication Sig Start Date End Date Taking? Authorizing Provider  ARIPiprazole  (ABILIFY ) 10 MG tablet Take 1 tablet (10 mg total) by mouth daily. For mood control. 10/14/24   Collene Gouge I, NP  FLUoxetine  (PROZAC ) 40 MG capsule Take 1 capsule (40 mg total) by mouth daily. For depression. 10/14/24   Collene Gouge I, NP  hydrOXYzine  (ATARAX ) 25 MG tablet Take 1 tablet (25 mg total) by mouth 3 (three) times daily as needed for anxiety. 10/13/24   Collene Gouge I, NP  propranolol  (INDERAL ) 10 MG tablet Take 1 tablet (10 mg total) by mouth 2 (two) times daily. For anxiety. 10/13/24   Collene Gouge I, NP  traZODone  (DESYREL ) 100 MG tablet Take 1 tablet (100 mg total) by mouth at bedtime. For sleep. 10/13/24   Collene Gouge I, NP  famotidine  (PEPCID ) 20 MG tablet Take 1 tablet (20 mg total) by mouth 2 (two) times daily. Patient not taking: Reported on 01/10/2020 01/02/20 01/31/20  Griselda Norris, MD    Allergies: Patient has no known allergies.    Review of Systems  Cardiovascular:  Positive for chest pain.  Neurological:  Positive for headaches.    Updated Vital Signs BP 123/74 (BP Location: Right Arm)   Pulse 76   Temp (!) 97.3 F (36.3 C) (Oral)   Resp 16   Ht 5' 2 (1.575 m)   Wt 98.3 kg   LMP 10/07/2024   SpO2 96%   BMI 39.64 kg/m   Physical Exam  (all labs ordered are listed, but only abnormal results are displayed) Labs Reviewed  BASIC METABOLIC PANEL WITH GFR  CBC  HCG, SERUM, QUALITATIVE  TROPONIN T, HIGH SENSITIVITY  TROPONIN T, HIGH SENSITIVITY     EKG: None  Radiology: DG Chest 2 View Result Date: 10/14/2024 EXAM: 2 VIEW(S) XRAY OF THE CHEST 10/14/2024 01:26:18 AM COMPARISON: 01/02/2020 CLINICAL HISTORY: Chest pain. FINDINGS: LUNGS AND PLEURA: No focal pulmonary opacity. No pleural effusion. No pneumothorax. HEART AND MEDIASTINUM: No acute abnormality of the cardiac and mediastinal silhouettes. BONES AND SOFT TISSUES: No acute osseous abnormality. IMPRESSION: 1. No acute cardiopulmonary pathology. Electronically signed by: Oneil Devonshire MD 10/14/2024 01:28 AM EST RP Workstation: [id number]    {Document cardiac monitor, telemetry assessment procedure when appropriate:32947} Procedures   Medications Ordered in the ED  ibuprofen  (ADVIL ) tablet 600 mg (has no administration in time range)  ondansetron  (ZOFRAN -ODT) disintegrating tablet 4 mg (has no administration in time range)  famotidine  (PEPCID ) tablet 20 mg (has no administration in time range)      {Click here for ABCD2, HEART and other calculators REFRESH Note before signing:1}                              Medical Decision Making Risk Prescription drug management.   ***  {Document critical care time when appropriate  Document review of labs and clinical decision tools ie CHADS2VASC2, etc  Document your independent review of radiology images and any outside records  Document your discussion with family members, caretakers  and with consultants  Document social determinants of health affecting pt's care  Document your decision making why or why not admission, treatments were needed:32947:::1}   Final diagnoses:  None    ED Discharge Orders     None        "

## 2024-10-14 NOTE — ED Triage Notes (Signed)
 Quick triage note: Pt to ED c/o body aches, CP, HA that started today.  Reports son in the home is also sick with a cold.  NAD. Discharged from hospital yesterday.

## 2024-10-23 ENCOUNTER — Other Ambulatory Visit: Payer: Self-pay

## 2024-10-23 ENCOUNTER — Emergency Department (HOSPITAL_COMMUNITY)
Admission: EM | Admit: 2024-10-23 | Discharge: 2024-10-25 | Disposition: A | Attending: Emergency Medicine | Admitting: Emergency Medicine

## 2024-10-23 ENCOUNTER — Encounter (HOSPITAL_COMMUNITY): Payer: Self-pay | Admitting: Emergency Medicine

## 2024-10-23 DIAGNOSIS — R112 Nausea with vomiting, unspecified: Secondary | ICD-10-CM | POA: Insufficient documentation

## 2024-10-23 DIAGNOSIS — R45851 Suicidal ideations: Secondary | ICD-10-CM | POA: Insufficient documentation

## 2024-10-23 DIAGNOSIS — R109 Unspecified abdominal pain: Secondary | ICD-10-CM | POA: Insufficient documentation

## 2024-10-23 DIAGNOSIS — F322 Major depressive disorder, single episode, severe without psychotic features: Secondary | ICD-10-CM | POA: Insufficient documentation

## 2024-10-23 DIAGNOSIS — F332 Major depressive disorder, recurrent severe without psychotic features: Secondary | ICD-10-CM | POA: Diagnosis present

## 2024-10-23 DIAGNOSIS — R10A1 Flank pain, right side: Secondary | ICD-10-CM

## 2024-10-23 DIAGNOSIS — Z79899 Other long term (current) drug therapy: Secondary | ICD-10-CM | POA: Insufficient documentation

## 2024-10-23 DIAGNOSIS — F419 Anxiety disorder, unspecified: Secondary | ICD-10-CM | POA: Insufficient documentation

## 2024-10-23 DIAGNOSIS — R319 Hematuria, unspecified: Secondary | ICD-10-CM | POA: Insufficient documentation

## 2024-10-24 ENCOUNTER — Emergency Department (HOSPITAL_COMMUNITY)

## 2024-10-24 LAB — BASIC METABOLIC PANEL WITH GFR
Anion gap: 11 (ref 5–15)
BUN: 12 mg/dL (ref 6–20)
CO2: 26 mmol/L (ref 22–32)
Calcium: 9.5 mg/dL (ref 8.9–10.3)
Chloride: 103 mmol/L (ref 98–111)
Creatinine, Ser: 0.66 mg/dL (ref 0.44–1.00)
GFR, Estimated: >60 mL/min
Glucose, Bld: 101 mg/dL — ABNORMAL HIGH (ref 70–99)
Potassium: 3.7 mmol/L (ref 3.5–5.1)
Sodium: 140 mmol/L (ref 135–145)

## 2024-10-24 LAB — URINALYSIS, ROUTINE W REFLEX MICROSCOPIC
Bilirubin Urine: NEGATIVE
Glucose, UA: NEGATIVE mg/dL
Ketones, ur: NEGATIVE mg/dL
Leukocytes,Ua: NEGATIVE
Nitrite: NEGATIVE
Protein, ur: 30 mg/dL — AB
Specific Gravity, Urine: 1.028 (ref 1.005–1.030)
pH: 5 (ref 5.0–8.0)

## 2024-10-24 LAB — CBC
HCT: 39.3 % (ref 36.0–46.0)
Hemoglobin: 12.9 g/dL (ref 12.0–15.0)
MCH: 29.4 pg (ref 26.0–34.0)
MCHC: 32.8 g/dL (ref 30.0–36.0)
MCV: 89.5 fL (ref 80.0–100.0)
Platelets: 366 10*3/uL (ref 150–400)
RBC: 4.39 MIL/uL (ref 3.87–5.11)
RDW: 12.4 % (ref 11.5–15.5)
WBC: 9.9 10*3/uL (ref 4.0–10.5)
nRBC: 0 % (ref 0.0–0.2)

## 2024-10-24 LAB — URINE DRUG SCREEN
Amphetamines: NEGATIVE
Barbiturates: NEGATIVE
Benzodiazepines: NEGATIVE
Cocaine: NEGATIVE
Fentanyl: NEGATIVE
Methadone Scn, Ur: NEGATIVE
Opiates: NEGATIVE
Tetrahydrocannabinol: POSITIVE — AB

## 2024-10-24 LAB — ETHANOL: Alcohol, Ethyl (B): <15 mg/dL

## 2024-10-24 LAB — HCG, SERUM, QUALITATIVE: Preg, Serum: NEGATIVE

## 2024-10-24 MED ORDER — OXYCODONE-ACETAMINOPHEN 5-325 MG PO TABS
1.0000 | ORAL_TABLET | Freq: Once | ORAL | Status: AC
  Administered 2024-10-24: 1 via ORAL
  Filled 2024-10-24: qty 1

## 2024-10-24 MED ORDER — FLUOXETINE HCL 20 MG PO CAPS: 40.0000 mg | ORAL_CAPSULE | Freq: Every day | ORAL | Status: DC

## 2024-10-24 MED ORDER — ONDANSETRON 4 MG PO TBDP
4.0000 mg | ORAL_TABLET | Freq: Once | ORAL | Status: AC
  Administered 2024-10-24: 4 mg via ORAL
  Filled 2024-10-24: qty 1

## 2024-10-24 MED ORDER — ARIPIPRAZOLE 10 MG PO TABS: 10.0000 mg | ORAL_TABLET | Freq: Every day | ORAL | Status: DC

## 2024-10-24 MED ORDER — TRAZODONE HCL 50 MG PO TABS
100.0000 mg | ORAL_TABLET | Freq: Every day | ORAL | Status: DC
  Administered 2024-10-24: 100 mg via ORAL
  Filled 2024-10-24: qty 2

## 2024-10-24 MED ORDER — HYDROXYZINE HCL 25 MG PO TABS: 25.0000 mg | ORAL_TABLET | Freq: Three times a day (TID) | ORAL | Status: DC | PRN

## 2024-10-24 MED ORDER — FAMOTIDINE 20 MG PO TABS
20.0000 mg | ORAL_TABLET | Freq: Two times a day (BID) | ORAL | Status: DC
  Administered 2024-10-24: 20 mg via ORAL
  Filled 2024-10-24: qty 1

## 2024-10-24 NOTE — ED Notes (Signed)
 Report given to Emmie Che RN

## 2024-10-24 NOTE — ED Notes (Signed)
 Patient C/O burning with urination and right flank pain x2 days. Patient denies taking OTC medications for symptoms.

## 2024-10-24 NOTE — ED Notes (Signed)
 Voluntary admission for treatment consent form uploaded to the patient's chart.

## 2024-10-24 NOTE — ED Provider Notes (Signed)
" °  Physical Exam  BP 122/87   Pulse 69   Temp 98.3 F (36.8 C) (Oral)   Resp 16   Ht 5' 2 (1.575 m)   Wt 98 kg   LMP 10/07/2024   SpO2 100%   BMI 39.52 kg/m   Physical Exam  Procedures  Procedures  ED Course / MDM    Medical Decision Making Amount and/or Complexity of Data Reviewed Labs: ordered. Radiology: ordered.  Risk Prescription drug management.   Inpatient psychiatric treatment has been requested       Patsey Lot, MD 10/24/24 [phone]  "

## 2024-10-25 ENCOUNTER — Inpatient Hospital Stay: Admission: AD | Admit: 2024-10-25 | Source: Intra-hospital

## 2024-10-25 ENCOUNTER — Other Ambulatory Visit: Payer: Self-pay

## 2024-10-25 ENCOUNTER — Encounter: Payer: Self-pay | Admitting: Psychiatry

## 2024-10-25 DIAGNOSIS — F322 Major depressive disorder, single episode, severe without psychotic features: Secondary | ICD-10-CM | POA: Diagnosis present

## 2024-10-25 DIAGNOSIS — F329 Major depressive disorder, single episode, unspecified: Principal | ICD-10-CM | POA: Diagnosis present

## 2024-10-25 MED ORDER — DIPHENHYDRAMINE HCL 50 MG/ML IJ SOLN: 50.0000 mg | Freq: Three times a day (TID) | INTRAMUSCULAR | Status: AC | PRN

## 2024-10-25 MED ORDER — LORAZEPAM 2 MG/ML IJ SOLN: 2.0000 mg | Freq: Three times a day (TID) | INTRAMUSCULAR | Status: AC | PRN

## 2024-10-25 MED ORDER — DIPHENHYDRAMINE HCL 25 MG PO CAPS: 50.0000 mg | ORAL_CAPSULE | Freq: Three times a day (TID) | ORAL | Status: AC | PRN

## 2024-10-25 MED ORDER — DIPHENHYDRAMINE HCL 25 MG PO CAPS
50.0000 mg | ORAL_CAPSULE | Freq: Three times a day (TID) | ORAL | Status: AC | PRN
  Administered 2024-10-27: 50 mg via ORAL
  Filled 2024-10-25: qty 2

## 2024-10-25 MED ORDER — HALOPERIDOL LACTATE 5 MG/ML IJ SOLN: 5.0000 mg | Freq: Three times a day (TID) | INTRAMUSCULAR | Status: AC | PRN

## 2024-10-25 MED ORDER — HYDROXYZINE HCL 25 MG PO TABS
25.0000 mg | ORAL_TABLET | Freq: Three times a day (TID) | ORAL | Status: AC | PRN
  Administered 2024-10-25 – 2024-10-27 (×4): 25 mg via ORAL
  Filled 2024-10-25 (×4): qty 1

## 2024-10-25 MED ORDER — ARIPIPRAZOLE 10 MG PO TABS
10.0000 mg | ORAL_TABLET | Freq: Every day | ORAL | Status: AC
  Administered 2024-10-25 – 2024-10-27 (×3): 10 mg via ORAL
  Filled 2024-10-25 (×3): qty 1

## 2024-10-25 MED ORDER — HALOPERIDOL 5 MG PO TABS
5.0000 mg | ORAL_TABLET | Freq: Three times a day (TID) | ORAL | Status: AC | PRN
  Filled 2024-10-25: qty 1

## 2024-10-25 MED ORDER — ALUM & MAG HYDROXIDE-SIMETH 200-200-20 MG/5ML PO SUSP: 30.0000 mL | ORAL | Status: AC | PRN

## 2024-10-25 MED ORDER — MAGNESIUM HYDROXIDE 400 MG/5ML PO SUSP: 30.0000 mL | Freq: Every day | ORAL | Status: AC | PRN

## 2024-10-25 MED ORDER — FLUOXETINE HCL 20 MG PO CAPS
40.0000 mg | ORAL_CAPSULE | Freq: Every day | ORAL | Status: AC
  Administered 2024-10-25 – 2024-10-27 (×3): 40 mg via ORAL
  Filled 2024-10-25 (×3): qty 2

## 2024-10-25 MED ORDER — TRAZODONE HCL 100 MG PO TABS
100.0000 mg | ORAL_TABLET | Freq: Every day | ORAL | Status: DC
  Administered 2024-10-25: 100 mg via ORAL
  Filled 2024-10-25: qty 1

## 2024-10-25 MED ORDER — ACETAMINOPHEN 325 MG PO TABS: 650.0000 mg | ORAL_TABLET | Freq: Four times a day (QID) | ORAL | Status: AC | PRN

## 2024-10-25 MED ORDER — HALOPERIDOL LACTATE 5 MG/ML IJ SOLN: 10.0000 mg | Freq: Three times a day (TID) | INTRAMUSCULAR | Status: AC | PRN

## 2024-10-25 MED ORDER — FAMOTIDINE 20 MG PO TABS
20.0000 mg | ORAL_TABLET | Freq: Two times a day (BID) | ORAL | Status: AC
  Administered 2024-10-25 – 2024-10-27 (×6): 20 mg via ORAL
  Filled 2024-10-25 (×6): qty 1

## 2024-10-25 MED ORDER — HALOPERIDOL 5 MG PO TABS
5.0000 mg | ORAL_TABLET | Freq: Three times a day (TID) | ORAL | Status: AC | PRN
  Administered 2024-10-27: 5 mg via ORAL

## 2024-10-25 NOTE — Group Note (Signed)
 Date:  10/25/2024 Time:  11:11 AM  Group Topic/Focus:  Goals Group:   The focus of this group is to help patients establish daily goals to achieve during treatment and discuss how the patient can incorporate goal setting into their daily lives to aide in recovery.    Participation Level:  Did Not Attend   Felicia Sutton 10/25/2024, 11:11 AM

## 2024-10-25 NOTE — BH IP Treatment Plan (Signed)
 Interdisciplinary Treatment and Diagnostic Plan Update  10/25/2024 Time of Session: 10:23 AM Felicia Sutton MRN: [id number]  Principal Diagnosis: MDD (major depressive disorder)  Secondary Diagnoses: Principal Problem:   MDD (major depressive disorder) Active Problems:   MDD (major depressive disorder), severe (HCC)   Current Medications:  Current Facility-Administered Medications  Medication Dose Route Frequency Provider Last Rate Last Admin   acetaminophen  (TYLENOL ) tablet 650 mg  650 mg Oral Q6H PRN Coleman, Carolyn H, NP       alum & mag hydroxide-simeth (MAALOX/MYLANTA) 200-200-20 MG/5ML suspension 30 mL  30 mL Oral Q4H PRN Coleman, Carolyn H, NP       ARIPiprazole  (ABILIFY ) tablet 10 mg  10 mg Oral Daily Coleman, Carolyn H, NP   10 mg at 10/25/24 9158   haloperidol  (HALDOL ) tablet 5 mg  5 mg Oral TID PRN Onuoha, Chinwendu V, NP       And   diphenhydrAMINE  (BENADRYL ) capsule 50 mg  50 mg Oral TID PRN Onuoha, Chinwendu V, NP       haloperidol  (HALDOL ) tablet 5 mg  5 mg Oral TID PRN Mardy Elveria DEL, NP       And   diphenhydrAMINE  (BENADRYL ) capsule 50 mg  50 mg Oral TID PRN Mardy Elveria DEL, NP       haloperidol  lactate (HALDOL ) injection 5 mg  5 mg Intramuscular TID PRN Onuoha, Chinwendu V, NP       And   diphenhydrAMINE  (BENADRYL ) injection 50 mg  50 mg Intramuscular TID PRN Onuoha, Chinwendu V, NP       And   LORazepam  (ATIVAN ) injection 2 mg  2 mg Intramuscular TID PRN Onuoha, Chinwendu V, NP       haloperidol  lactate (HALDOL ) injection 10 mg  10 mg Intramuscular TID PRN Onuoha, Chinwendu V, NP       And   diphenhydrAMINE  (BENADRYL ) injection 50 mg  50 mg Intramuscular TID PRN Onuoha, Chinwendu V, NP       And   LORazepam  (ATIVAN ) injection 2 mg  2 mg Intramuscular TID PRN Onuoha, Chinwendu V, NP       haloperidol  lactate (HALDOL ) injection 5 mg  5 mg Intramuscular TID PRN Mardy Elveria DEL, NP       And   diphenhydrAMINE  (BENADRYL ) injection 50 mg  50 mg  Intramuscular TID PRN Mardy Elveria DEL, NP       And   LORazepam  (ATIVAN ) injection 2 mg  2 mg Intramuscular TID PRN Coleman, Carolyn H, NP       haloperidol  lactate (HALDOL ) injection 10 mg  10 mg Intramuscular TID PRN Mardy Elveria DEL, NP       And   diphenhydrAMINE  (BENADRYL ) injection 50 mg  50 mg Intramuscular TID PRN Mardy Elveria DEL, NP       And   LORazepam  (ATIVAN ) injection 2 mg  2 mg Intramuscular TID PRN Coleman, Carolyn H, NP       famotidine  (PEPCID ) tablet 20 mg  20 mg Oral BID Coleman, Carolyn H, NP   20 mg at 10/25/24 0841   FLUoxetine  (PROZAC ) capsule 40 mg  40 mg Oral Daily Coleman, Carolyn H, NP   40 mg at 10/25/24 0841   hydrOXYzine  (ATARAX ) tablet 25 mg  25 mg Oral TID PRN Coleman, Carolyn H, NP   25 mg at 10/25/24 0844   magnesium  hydroxide (MILK OF MAGNESIA) suspension 30 mL  30 mL Oral Daily PRN Mardy Elveria DEL, NP  traZODone  (DESYREL ) tablet 100 mg  100 mg Oral QHS Coleman, Carolyn H, NP       PTA Medications: Medications Prior to Admission  Medication Sig Dispense Refill Last Dose/Taking   ARIPiprazole  (ABILIFY ) 10 MG tablet Take 1 tablet (10 mg total) by mouth daily. For mood control. 30 tablet 0    famotidine  (PEPCID ) 20 MG tablet Take 1 tablet (20 mg total) by mouth 2 (two) times daily. 30 tablet 0    FLUoxetine  (PROZAC ) 40 MG capsule Take 1 capsule (40 mg total) by mouth daily. For depression. 30 capsule 0    hydrOXYzine  (ATARAX ) 25 MG tablet Take 1 tablet (25 mg total) by mouth 3 (three) times daily as needed for anxiety. 75 tablet 0    ondansetron  (ZOFRAN ) 4 MG tablet Take 1 tablet (4 mg total) by mouth every 8 (eight) hours as needed for nausea or vomiting. (Patient not taking: Reported on 10/24/2024)      propranolol  (INDERAL ) 10 MG tablet Take 1 tablet (10 mg total) by mouth 2 (two) times daily. For anxiety. 60 tablet 0    traZODone  (DESYREL ) 100 MG tablet Take 1 tablet (100 mg total) by mouth at bedtime. For sleep. 30 tablet 0     Patient  Stressors: Medication change or noncompliance   Traumatic event    Patient Strengths: Motivation for treatment/growth   Treatment Modalities: Medication Management, Group therapy, Case management,  1 to 1 session with clinician, Psychoeducation, Recreational therapy.   Physician Treatment Plan for Primary Diagnosis: MDD (major depressive disorder) Long Term Goal(s):     Short Term Goals:    Medication Management: Evaluate patient's response, side effects, and tolerance of medication regimen.  Therapeutic Interventions: 1 to 1 sessions, Unit Group sessions and Medication administration.  Evaluation of Outcomes: Not Met  Physician Treatment Plan for Secondary Diagnosis: Principal Problem:   MDD (major depressive disorder) Active Problems:   MDD (major depressive disorder), severe (HCC)  Long Term Goal(s):     Short Term Goals:       Medication Management: Evaluate patient's response, side effects, and tolerance of medication regimen.  Therapeutic Interventions: 1 to 1 sessions, Unit Group sessions and Medication administration.  Evaluation of Outcomes: Not Met   RN Treatment Plan for Primary Diagnosis: MDD (major depressive disorder) Long Term Goal(s): Knowledge of disease and therapeutic regimen to maintain health will improve  Short Term Goals: Ability to verbalize frustration and anger appropriately will improve, Ability to demonstrate self-control, Ability to participate in decision making will improve, Ability to verbalize feelings will improve, Ability to disclose and discuss suicidal ideas, and Ability to identify and develop effective coping behaviors will improve  Medication Management: RN will administer medications as ordered by provider, will assess and evaluate patient's response and provide education to patient for prescribed medication. RN will report any adverse and/or side effects to prescribing provider.  Therapeutic Interventions: 1 on 1 counseling  sessions, Psychoeducation, Medication administration, Evaluate responses to treatment, Monitor vital signs and CBGs as ordered, Perform/monitor CIWA, COWS, AIMS and Fall Risk screenings as ordered, Perform wound care treatments as ordered.  Evaluation of Outcomes: Not Met   LCSW Treatment Plan for Primary Diagnosis: MDD (major depressive disorder) Long Term Goal(s): Safe transition to appropriate next level of care at discharge, Engage patient in therapeutic group addressing interpersonal concerns.  Short Term Goals: Engage patient in aftercare planning with referrals and resources, Increase social support, Increase ability to appropriately verbalize feelings, Increase emotional regulation, Facilitate acceptance of mental health  diagnosis and concerns, Facilitate patient progression through stages of change regarding substance use diagnoses and concerns, Identify triggers associated with mental health/substance abuse issues, and Increase skills for wellness and recovery  Therapeutic Interventions: Assess for all discharge needs, 1 to 1 time with Social worker, Explore available resources and support systems, Assess for adequacy in community support network, Educate family and significant other(s) on suicide prevention, Complete Psychosocial Assessment, Interpersonal group therapy.  Evaluation of Outcomes: Not Met   Progress in Treatment: Attending groups: No. Participating in groups: No. Taking medication as prescribed: Yes. Toleration medication: Yes. Family/Significant other contact made: No, will contact:  CSW to contact once permission is granted.  Patient understands diagnosis: Yes. Discussing patient identified problems/goals with staff: Yes. Medical problems stabilized or resolved: Yes. Denies suicidal/homicidal ideation: Yes. Issues/concerns per patient self-inventory: No. Other: None  New problem(s) identified: No, Describe:  None  New Short Term/Long Term Goal(s):detox,  elimination of symptoms of psychosis, medication management for mood stabilization; elimination of SI thoughts; development of comprehensive mental wellness/sobriety plan.    Patient Goals:  I want to love myself.  Discharge Plan or Barriers: CSW to assist with the development of appropriate discharge plan.    Reason for Continuation of Hospitalization: Anxiety Depression Suicidal ideation  Estimated Length of Stay: 1-7 days.   Last 3 [place] Suicide Severity Risk Score: Flowsheet Row Admission (Current) from 10[facility]25/20[facility] ED from 10/23/2024 in [facility] Emergency Department at [facility] ED from 10/14/2024 in [facility] Emergency Department at [facility]  C-SSRS RISK CATEGORY High Risk High Risk No Risk    Last [facility] 2/9 Scores:     No data to display          Scribe for Treatment Team: Paolina Karwowski M Jeniyah Menor, KEN 10/25/2024 10:42 AM

## 2024-10-25 NOTE — Group Note (Signed)
 Date:  10/25/2024 Time:  8:57 PM  Group Topic/Focus:  Wrap-Up Group:   The focus of this group is to help patients review their daily goal of treatment and discuss progress on daily workbooks.    Participation Level:  Active  Participation Quality:  Appropriate  Affect:  Appropriate  Cognitive:  Alert  Insight: Good  Engagement in Group:  Engaged  Modes of Intervention:  Activity   Felicia Sutton Getting 10/25/2024, 8:57 PM

## 2024-10-25 NOTE — Progress Notes (Signed)
 Pt calm and pleasant during assessment denying SI/HI/AVH. Pt observed by this Clinical research associate interacting appropriately with staff and peers on the unit. Pt compliant with medication administration per MD orders. Pt given education, support, and encouragement to be active in her treatment plan. Pt being monitored Q 15 minutes for safety per unit protocol, remains safe on the unit

## 2024-10-25 NOTE — Plan of Care (Signed)

## 2024-10-25 NOTE — Progress Notes (Signed)
" °   10/25/24 0200  Psych Admission Type (Psych Patients Only)  Admission Status Voluntary  Psychosocial Assessment  Patient Complaints None  Eye Contact Fair  Facial Expression Flat  Affect Depressed  Speech Soft  Interaction Minimal  Motor Activity Other (Comment) (WNL)  Appearance/Hygiene Unremarkable  Behavior Characteristics Cooperative;Appropriate to situation  Mood Pleasant  Aggressive Behavior  Effect No apparent injury  Thought Process  Coherency WDL  Content WDL  Delusions None reported or observed  Perception WDL  Hallucination None reported or observed  Judgment Limited  Confusion None  Danger to Self  Current suicidal ideation? Denies  Danger to Others  Danger to Others None reported or observed    "

## 2024-10-25 NOTE — ED Notes (Signed)
 Pt. Walked to safe transport and belongings given to driver with paperwork

## 2024-10-25 NOTE — [facility] Suicide Risk Assessment (Incomplete)
 [facility] Admission Suicide Risk Assessment   Nursing information obtained from:    Demographic factors:  Caucasian Current Mental Status:  Suicidal ideation indicated by patient Loss Factors:  Loss of significant relationship Historical Factors:  Prior suicide attempts Risk Reduction Factors:  Sense of responsibility to family  Total Time spent with patient: 1 hour Principal Problem: MDD (major depressive disorder) Diagnosis:  Principal Problem:   MDD (major depressive disorder) Active Problems:   MDD (major depressive disorder), severe (HCC)  Subjective Data:  Felicia Sutton is a 44 y.o. female with a history of MDD, GAD, substance abuse and 1 prior suicide attempt via OD who presented to the ED for complaints of right flank pain. She later informed a nurse that a provider needed to be notified immediately because she was having suicidal thoughts with intent to take a bunch of pills. She was admitted for psychiatric inpatient care. Her last admission was 10/05/24.   Today, 2/4 she reports that she was brought to the ED by her husband and has been feeling down since her son passed away in a drunk driving accident a week ago. Per psychiatric consult, she is inconsistent with reporting psychosocial stressors. Today, she states her mom died four weeks ago, her dad died seven weeks ago, and her son died last week. Per chart, she previously reported mother died in Nov 08, 2020.    Today she reports fatigue, inadequate sleep, and loss of appetite. She denies SI, HI, AVH.   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 She is unable to recall any special education classes or diagnoses. When given examples, she recognized ADHD and autism.    UDS is positive for THC. When initially asked about drug use she denied everything. Once asked directly about marijuana use, she admitted to using it last week after her son passed away.  Continued Clinical Symptoms:  Alcohol  Use Disorder Identification Test Final Score (AUDIT): 0 The Alcohol Use Disorders Identification Test, Guidelines for Use in Primary Care, Second Edition.  [facility]). Score between 0-7:  no or low risk or alcohol related problems. Score between 8-15:  moderate risk of alcohol related problems. Score between 16-19:  high risk of alcohol related problems. Score 20 or above:  warrants further diagnostic evaluation for alcohol dependence and treatment.   CLINICAL FACTORS:   Severe Anxiety and/or Agitation Depression:   Anhedonia Hopelessness Impulsivity Insomnia Dysthymia More than one psychiatric diagnosis Previous Psychiatric Diagnoses and Treatments   Musculoskeletal: Strength & Muscle Tone: within normal limits Gait & Station: normal Patient leans: N/A  Psychiatric Specialty Exam:  Presentation  General Appearance:  Appropriate for Environment  Eye Contact: Fair  Speech: Clear and Coherent  Speech Volume: Normal  Handedness: Right   Mood and Affect  Mood: Depressed; Dysphoric  Affect: Congruent   Thought Process  Thought Processes: Coherent  Descriptions of Associations:Intact  Orientation:Full (Time, Place and Person)  Thought Content:Logical  History of Schizophrenia/Schizoaffective disorder:No  Duration of Psychotic Symptoms:No data recorded Hallucinations:Hallucinations: None  Ideas of Reference:None  Suicidal Thoughts:Suicidal Thoughts: Yes, Passive SI Passive Intent and/or Plan: With Intent  Homicidal Thoughts:Homicidal Thoughts: No   Sensorium  Memory: Immediate Poor; Recent Poor; Remote Poor  Judgment: Poor  Insight: Lacking   Executive Functions  Concentration: Fair  Attention Span: Fair  Recall: Poor  Fund of Knowledge: Poor  Language: Fair   Psychomotor Activity  Psychomotor Activity: Psychomotor Activity: Normal   Assets  Assets: Housing; Intimacy; Resilience;  Desire for  Improvement; Social Support   Sleep  Sleep: Sleep: Poor Number of Hours of Sleep: 2    Physical Exam: Physical Exam Vitals and nursing note reviewed.  Constitutional:      Appearance: Normal appearance.  Cardiovascular:     Rate and Rhythm: Normal rate.  Pulmonary:     Effort: Pulmonary effort is normal.  Neurological:     Mental Status: She is alert.  Psychiatric:        Mood and Affect: Mood normal.    Review of Systems  Constitutional:  Negative for chills and fever.  Respiratory:  Negative for cough and shortness of breath.   Cardiovascular:  Negative for chest pain.  Psychiatric/Behavioral:  Positive for depression, substance abuse and suicidal ideas. Negative for hallucinations and memory loss. The patient is nervous/anxious and has insomnia.    Blood pressure 110/74, pulse 75, temperature 97.9 F (36.6 C), temperature source Oral, resp. rate 12, height 5' 0.25 (1.53 m), weight 98 kg, last menstrual period 10/07/2024, SpO2 100%. Body mass index is 41.85 kg/m.   COGNITIVE FEATURES THAT CONTRIBUTE TO RISK:  None    SUICIDE RISK:   Moderate:  Frequent suicidal ideation with limited intensity, and duration, some specificity in terms of plans, no associated intent, good self-control, limited dysphoria/symptomatology, some risk factors present, and identifiable protective factors, including available and accessible social support.  PLAN OF CARE: Inpatient psychiatric admission.  I certify that inpatient services furnished can reasonably be expected to improve the patient's condition.   Kamorah Nevils, NP 10/25/2024, 4:45 PM

## 2024-10-25 NOTE — Plan of Care (Signed)
   Problem: Education: Goal: Verbalization of understanding the information provided will improve Outcome: Progressing

## 2024-10-25 NOTE — Tx Team (Signed)
 Initial Treatment Plan 10/25/2024 2:09 AM [name] FMW:[id number]    PATIENT STRESSORS: Medication change or noncompliance   Traumatic event     PATIENT STRENGTHS: Motivation for treatment/growth    PATIENT IDENTIFIED PROBLEMS: Depression  Polysubstance abuse                    DISCHARGE CRITERIA:  Ability to meet basic life and health needs Improved stabilization in mood, thinking, and/or behavior  PRELIMINARY DISCHARGE PLAN: Outpatient therapy  PATIENT/FAMILY INVOLVEMENT: This treatment plan has been presented to and reviewed with the patient, Felicia Sutton, and/or family member.  The patient and family have been given the opportunity to ask questions and make suggestions.  Tonna DELENA Home, RN 10/25/2024, 2:09 AM

## 2024-10-25 NOTE — H&P (Incomplete)
 " Psychiatric Admission Assessment Adult  Patient Identification: Felicia Sutton MRN:  [id number] Date of Evaluation:  10/25/2024 Chief Complaint:  MDD (major depressive disorder) [F32.9] MDD (major depressive disorder), severe (HCC) [F32.2]   History of Present Illness:  Felicia Sutton is a 44 y.o. female with a history of MDD, GAD, substance abuse and 1 prior suicide attempt via OD who presented to the ED for complaints of right flank pain. She later informed a nurse that a provider needed to be notified immediately because she was having suicidal thoughts with intent to take a bunch of pills. She was admitted for psychiatric inpatient care. Her last admission was 10/05/24.  Today, 2/4 she reports that she was brought to the ED by her husband and has been feeling down since her son passed away in a drunk driving accident a week ago. Per psychiatric consult, she is inconsistent with reporting psychosocial stressors. Today, she states her mom died four weeks ago, her dad died a few days ago, and her son died last week. Per chart, mother died in 11-11-20.  Today she reports fatigue, inadequate sleep, and loss of appetite. She denies SI, HI, AVH.  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 She is unable to recall any special education classes or diagnoses.  UDS is positive for THC. When initially asked about drug use she denied everything. Once asked directly about marijuana use, she admitted to using it last week after her son passed away.  Associated Signs/Symptoms: Depression Symptoms:  depressed mood, anhedonia, insomnia, fatigue, feelings of worthlessness/guilt, hopelessness, impaired memory, suicidal thoughts with specific plan, anxiety, loss of energy/fatigue, disturbed sleep, (Hypo) Manic Symptoms:  Impulsivity, Anxiety Symptoms:  Excessive Worry, Psychotic Symptoms:  None PTSD Symptoms: Negative  Did the patient present with any  abnormal findings indicating the need for additional neurological or psychological testing?  No  Total Time spent with patient: 1 hour  Psychiatric History:  Information collected from patient/chart  Prev Dx/Sx: MDD, GAD, substance abuse, ADHD, autism Current Psych Provider: None Home Meds (current): None Previous Med Trials: Unknown Therapy: None  Prior Psych Hospitalization: Yes, last admission 1/15 in [place]  Prior Self Harm: History of 1 suicide attempt Prior Violence: Denies  Family Psych History: Denies Family Hx suicide: Denies  Social History:  Educational Hx: 12th grade Occupational Hx: Unemployed, on disability Legal Hx: Denies Living Situation: In house with ex-husband (patient reports husband of 8 years) Spiritual Hx: Christian Access to weapons/lethal means: Denies   Substance History Alcohol: Denies  Tobacco: Denies Illicit drugs: THC Prescription drug abuse: Denies Rehab hx: Denies  Is the patient at risk to self? Yes.    Has the patient been a risk to self in the past 6 months? Yes.    Has the patient been a risk to self within the distant past? Yes.    Is the patient a risk to others? No.  Has the patient been a risk to others in the past 6 months? No.  Has the patient been a risk to others within the distant past? No.   Columbia Scale:  Flowsheet Row Admission (Current) from 10/25/2024 in [facility]PAT[facility] ED from 10/23/2024 in [facility] Emergency Department at [facility] ED from 10/14/2024 in [facility] Emergency Department at [facility]  C-SSRS RISK CATEGORY High Risk High Risk No Risk     Past Medical History:  Past Medical History:  Diagnosis Date   GERD (gastroesophageal reflux disease)  2017   Major depression 12/2019    Past Surgical History:  Procedure Laterality Date   CHOLECYSTECTOMY  01/2019   ESOPHAGOGASTRODUODENOSCOPY (EGD) WITH PROPOFOL  N/A 02/01/2020   Procedure: ESOPHAGOGASTRODUODENOSCOPY  (EGD) WITH PROPOFOL ;  Surgeon: Legrand Victory LITTIE DOUGLAS, MD;  Location: [facility] ENDOSCOPY;  Service: Gastroenterology;  Laterality: N/A;   Family History: History reviewed. No pertinent family history.  Social History:  Social History   Substance and Sexual Activity  Alcohol Use Not Currently     Social History   Substance and Sexual Activity  Drug Use Not Currently      Allergies:  Allergies[1] Lab Results:  Results for orders placed or performed during the hospital encounter of 10/23/24 (from the past 48 hours)  hCG, serum, qualitative     Status: None   Collection Time: 10/24/24 12:14 AM  Result Value Ref Range   Preg, Serum NEGATIVE NEGATIVE    Comment:        THE SENSITIVITY OF THIS METHODOLOGY IS >10 mIU/mL. Performed at [facility], [address] [place], [place] [address]   Basic metabolic panel     Status: Abnormal   Collection Time: 10/24/24 12:14 AM  Result Value Ref Range   Sodium 140 135 - 145 mmol/L   Potassium 3.7 3.5 - 5.1 mmol/L   Chloride 103 98 - 111 mmol/L   CO2 26 22 - 32 mmol/L   Glucose, Bld 101 (H) 70 - 99 mg/dL    Comment: Glucose reference range applies only to samples taken after fasting for at least 8 hours.   BUN 12 6 - 20 mg/dL   Creatinine, Ser 9.33 0.44 - 1.00 mg/dL   Calcium 9.5 8.9 - 10.3 mg/dL   GFR, Estimated >39 >39 mL/min    Comment: (NOTE) Calculated using the CKD-EPI Creatinine Equation (2021)    Anion gap 11 5 - 15    Comment: Performed at [facility], [address], [place], [place] [address]  CBC     Status: None   Collection Time: 10/24/24 12:14 AM  Result Value Ref Range   WBC 9.9 4.0 - 10.5 K/uL   RBC 4.39 3.87 - 5.11 MIL/uL   Hemoglobin 12.9 12.0 - 15.0 g/dL   HCT 60.6 63.9 - 53.9 %   MCV 89.5 80.0 - 100.0 fL   MCH 29.4 26.0 - 34.0 pg   MCHC 32.8 30.0 - 36.0 g/dL   RDW 87.5 88.4 - 84.4 %   Platelets 366 150 - 400 K/uL   nRBC 0.0 0.0 - 0.2 %    Comment: Performed at [facility], [address], [place], [place] [address]  Urinalysis, Routine w reflex microscopic -Urine, Clean Catch     Status: Abnormal   Collection Time: 10/24/24 12:28 AM  Result Value Ref Range   Color, Urine YELLOW YELLOW   APPearance CLOUDY (A) CLEAR   Specific Gravity, Urine 1.028 1.005 - 1.030   pH 5.0 5.0 - 8.0   Glucose, UA NEGATIVE NEGATIVE mg/dL   Hgb urine dipstick SMALL (A) NEGATIVE   Bilirubin Urine NEGATIVE NEGATIVE   Ketones, ur NEGATIVE NEGATIVE mg/dL   Protein, ur 30 (A) NEGATIVE mg/dL   Nitrite NEGATIVE NEGATIVE   Leukocytes,Ua NEGATIVE NEGATIVE   RBC / HPF 6-10 0 - 5 RBC/hpf   WBC, UA 6-10 0 - 5 WBC/hpf   Bacteria, UA RARE (A) NONE SEEN   Squamous Epithelial / HPF 6-10 0 - 5 /HPF   Mucus  PRESENT    Ca Oxalate Crys, UA PRESENT     Comment: Performed at [facility], [address], [place], [place] [address]  Urine Drug Screen     Status: Abnormal   Collection Time: 10/24/24  4:34 AM  Result Value Ref Range   Opiates NEGATIVE NEGATIVE   Cocaine NEGATIVE NEGATIVE   Benzodiazepines NEGATIVE NEGATIVE   Amphetamines NEGATIVE NEGATIVE   Tetrahydrocannabinol POSITIVE (A) NEGATIVE   Barbiturates NEGATIVE NEGATIVE   Methadone Scn, Ur NEGATIVE NEGATIVE   Fentanyl  NEGATIVE NEGATIVE    Comment: (NOTE) Drug screen is for Medical Purposes only. Positive results are preliminary only. If confirmation is needed, notify lab within 5 days.  Drug Class                 Cutoff (ng/mL) Amphetamine and metabolites 1000 Barbiturate and metabolites 200 Benzodiazepine              200 Opiates and metabolites     300 Cocaine and metabolites     300 THC                         50 Fentanyl                     5 Methadone                   300  Trazodone  is metabolized in vivo to several metabolites,  including pharmacologically active m-CPP, which is excreted in the  urine.  Immunoassay screens for amphetamines and MDMA have potential  cross-reactivity with these compounds and [place] provide false  positive  result.  Performed at [facility], [address] [place], [place] [address]   Ethanol     Status: None   Collection Time: 10/24/24  4:35 AM  Result Value Ref Range   Alcohol, Ethyl (B) <15 <15 mg/dL    Comment: (NOTE) For medical purposes only. Performed at [facility], [address], [place], [place] [address]     Blood Alcohol level:  Lab Results  Component Value Date   [facility] <15 10/24/2024   ETH <10 11/29/2021    Metabolic Disorder Labs:  Lab Results  Component Value Date   HGBA1C 5.3 10/07/2024   MPG 105.41 10/07/2024   MPG 108 12/02/2021   No results found for: PROLACTIN Lab Results  Component Value Date   CHOL 147 10/07/2024   TRIG 97 10/07/2024   HDL 44 10/07/2024   CHOLHDL 3.3 10/07/2024   VLDL 19 10/07/2024   LDLCALC 83 10/07/2024   LDLCALC 84 12/02/2021    Current Medications: Current Facility-Administered Medications  Medication Dose Route Frequency Provider Last Rate Last Admin   acetaminophen  (TYLENOL ) tablet 650 mg  650 mg Oral Q6H PRN Coleman, Carolyn H, NP       alum & mag hydroxide-simeth (MAALOX/MYLANTA) 200-200-20 MG/5ML suspension 30 mL  30 mL Oral Q4H PRN Mardy Elveria DEL, NP       ARIPiprazole  (ABILIFY ) tablet 10 mg  10 mg Oral Daily Coleman, Carolyn H, NP   10 mg at 10/25/24 9158   haloperidol  (HALDOL ) tablet 5 mg  5 mg Oral TID PRN Onuoha, Chinwendu V, NP       And   diphenhydrAMINE  (BENADRYL ) capsule 50 mg  50 mg Oral TID PRN Onuoha, Chinwendu V, NP       haloperidol  (HALDOL ) tablet 5 mg  5 mg Oral TID  PRN Mardy Elveria DEL, NP       And   diphenhydrAMINE  (BENADRYL ) capsule 50 mg  50 mg Oral TID PRN Mardy Elveria DEL, NP       haloperidol  lactate (HALDOL ) injection 5 mg  5 mg Intramuscular TID PRN Onuoha, Chinwendu V, NP       And   diphenhydrAMINE  (BENADRYL ) injection 50 mg  50 mg Intramuscular TID PRN Onuoha, Chinwendu V, NP       And   LORazepam  (ATIVAN ) injection 2 mg  2 mg Intramuscular TID PRN  Onuoha, Chinwendu V, NP       haloperidol  lactate (HALDOL ) injection 10 mg  10 mg Intramuscular TID PRN Onuoha, Chinwendu V, NP       And   diphenhydrAMINE  (BENADRYL ) injection 50 mg  50 mg Intramuscular TID PRN Onuoha, Chinwendu V, NP       And   LORazepam  (ATIVAN ) injection 2 mg  2 mg Intramuscular TID PRN Onuoha, Chinwendu V, NP       haloperidol  lactate (HALDOL ) injection 5 mg  5 mg Intramuscular TID PRN Mardy Elveria DEL, NP       And   diphenhydrAMINE  (BENADRYL ) injection 50 mg  50 mg Intramuscular TID PRN Mardy Elveria DEL, NP       And   LORazepam  (ATIVAN ) injection 2 mg  2 mg Intramuscular TID PRN Coleman, Carolyn H, NP       haloperidol  lactate (HALDOL ) injection 10 mg  10 mg Intramuscular TID PRN Mardy Elveria DEL, NP       And   diphenhydrAMINE  (BENADRYL ) injection 50 mg  50 mg Intramuscular TID PRN Mardy Elveria DEL, NP       And   LORazepam  (ATIVAN ) injection 2 mg  2 mg Intramuscular TID PRN Mardy Elveria DEL, NP       famotidine  (PEPCID ) tablet 20 mg  20 mg Oral BID Coleman, Carolyn H, NP   20 mg at 10/25/24 0841   FLUoxetine  (PROZAC ) capsule 40 mg  40 mg Oral Daily Coleman, Carolyn H, NP   40 mg at 10/25/24 9158   hydrOXYzine  (ATARAX ) tablet 25 mg  25 mg Oral TID PRN Coleman, Carolyn H, NP   25 mg at 10/25/24 9155   magnesium  hydroxide (MILK OF MAGNESIA) suspension 30 mL  30 mL Oral Daily PRN Coleman, Carolyn H, NP       traZODone  (DESYREL ) tablet 100 mg  100 mg Oral QHS Coleman, Carolyn H, NP       PTA Medications: Medications Prior to Admission  Medication Sig Dispense Refill Last Dose/Taking   ARIPiprazole  (ABILIFY ) 10 MG tablet Take 1 tablet (10 mg total) by mouth daily. For mood control. 30 tablet 0    famotidine  (PEPCID ) 20 MG tablet Take 1 tablet (20 mg total) by mouth 2 (two) times daily. 30 tablet 0    FLUoxetine  (PROZAC ) 40 MG capsule Take 1 capsule (40 mg total) by mouth daily. For depression. 30 capsule 0    hydrOXYzine  (ATARAX ) 25 MG tablet Take 1 tablet  (25 mg total) by mouth 3 (three) times daily as needed for anxiety. 75 tablet 0    ondansetron  (ZOFRAN ) 4 MG tablet Take 1 tablet (4 mg total) by mouth every 8 (eight) hours as needed for nausea or vomiting. (Patient not taking: Reported on 10/24/2024)      propranolol  (INDERAL ) 10 MG tablet Take 1 tablet (10 mg total) by mouth 2 (two) times daily. For anxiety. 60 tablet 0  traZODone  (DESYREL ) 100 MG tablet Take 1 tablet (100 mg total) by mouth at bedtime. For sleep. 30 tablet 0     Psychiatric Specialty Exam:  Presentation  General Appearance:  Appropriate for Environment  Eye Contact: Fair  Speech: Clear and Coherent  Speech Volume: Normal    Mood and Affect  Mood: Depressed; Dysphoric  Affect: Congruent   Thought Process  Thought Processes: Coherent  Descriptions of Associations:Intact  Orientation:Full (Time, Place and Person)  Thought Content:Logical  Hallucinations:Hallucinations: None  Ideas of Reference:None  Suicidal Thoughts:Suicidal Thoughts: Yes, Passive SI Passive Intent and/or Plan: With Intent  Homicidal Thoughts:Homicidal Thoughts: No   Sensorium  Memory: Immediate Poor; Recent Poor; Remote Poor  Judgment: Poor  Insight: Lacking   Executive Functions  Concentration: Fair  Attention Span: Fair  Recall: Poor  Fund of Knowledge: Poor  Language: Fair   Psychomotor Activity  Psychomotor Activity:Psychomotor Activity: Normal   Assets  Assets: Housing; Intimacy; Resilience; Desire for Improvement; Social Support    Musculoskeletal: Strength & Muscle Tone: within normal limits Gait & Station: normal  Physical Exam: Physical Exam Vitals and nursing note reviewed.  Constitutional:      Appearance: Normal appearance.  Cardiovascular:     Rate and Rhythm: Normal rate.  Pulmonary:     Effort: Pulmonary effort is normal.  Neurological:     Mental Status: She is alert and oriented to person, place, and time.   Psychiatric:        Mood and Affect: Mood normal.        Behavior: Behavior normal.    Review of Systems  Constitutional:  Negative for chills and fever.  Respiratory:  Negative for shortness of breath.   Cardiovascular:  Negative for chest pain.  Psychiatric/Behavioral:  Positive for depression, substance abuse and suicidal ideas. Negative for hallucinations. The patient is nervous/anxious and has insomnia.    Blood pressure 110/74, pulse 75, temperature 97.9 F (36.6 C), temperature source Oral, resp. rate 12, height 5' 0.25 (1.53 m), weight 98 kg, last menstrual period 10/07/2024, SpO2 100%. Body mass index is 41.85 kg/m.  Principal Diagnosis: MDD (major depressive disorder) Diagnosis:  Principal Problem:   MDD (major depressive disorder) Active Problems:   MDD (major depressive disorder), severe (HCC)   Clinical Decision Making:  Treatment Plan Summary:  Safety and Monitoring:             -- Voluntary admission to inpatient psychiatric unit for safety, stabilization and treatment             -- Daily contact with patient to assess and evaluate symptoms and progress in treatment             -- Patient's case to be discussed in multi-disciplinary team meeting             -- Observation Level: q15 minute checks             -- Vital signs:  q12 hours             -- Precautions: suicide, elopement, and assault   2. Psychiatric Diagnoses and Treatment:  - Abilify  10 mg daily - Fluoxetine  40 mg daily - Hydroxyzine  25 mg TID PRN for anxiety - Trazodone  100 mg nightly for sleep  **Qtc 406 on 10/16/2024     -- The risks/benefits/side-effects/alternatives to this medication were discussed in detail with the patient and time was given for questions. The patient consents to medication trial.                --  Metabolic profile and EKG monitoring obtained while on an atypical antipsychotic (BMI: Lipid Panel: HbgA1c: QTc:)              -- Encouraged patient to participate in unit  milieu and in scheduled group therapies                            3. Medical Issues Being Addressed:  No medical issues currently.     4. Discharge Planning:              -- Social work and case management to assist with discharge planning and identification of hospital follow-up needs prior to discharge             -- Estimated LOS: 5-7 days             -- Discharge Concerns: Need to establish a safety plan; Medication compliance and effectiveness             -- Discharge Goals: Return home with outpatient referrals follow ups  Physician Treatment Plan for Primary Diagnosis: MDD (major depressive disorder) Long Term Goal(s): Improvement in symptoms so as ready for discharge  Short Term Goals: Ability to identify changes in lifestyle to reduce recurrence of condition will improve, Ability to verbalize feelings will improve, Ability to disclose and discuss suicidal ideas, Ability to demonstrate self-control will improve, Ability to identify and develop effective coping behaviors will improve, Ability to maintain clinical measurements within normal limits will improve, Compliance with prescribed medications will improve, and Ability to identify triggers associated with substance abuse/mental health issues will improve  I certify that inpatient services furnished can reasonably be expected to improve the patient's condition.    Jael Waldorf, NP 2/4/20264:30 PM      [1] No Known Allergies  "

## 2024-10-25 NOTE — [facility] Counselor (Signed)
 Adult Comprehensive Assessment  Patient ID: Felicia Sutton, female   DOB: Dec 17, 1980, 44 y.o.   MRN: [id number]  Information Source: Information source: Patient  Current Stressors:  Patient states their primary concerns and needs for treatment are:: I lost my son a week ago. I'm taking it hard right now. Patient states their goals for this hospitilization and ongoing recovery are:: To get better. Educational / Learning stressors: None reported. Employment / Job issues: None reported. Family Relationships: None reported. Financial / Lack of resources (include bankruptcy): None reported. Housing / Lack of housing: None reported. Physical health (include injuries & life threatening diseases): None reported. Social relationships: None reported. Substance abuse: None reported. Bereavement / Loss: I lost my son a week ago. I'm taking it hard right now.  Living/Environment/Situation:  Living Arrangements: Spouse/significant other Living conditions (as described by patient or guardian): WNL Who else lives in the home?: Patient reported that she lives with her husband. How long has patient lived in current situation?: 3 years. What is atmosphere in current home: Comfortable  Family History:  Marital status: Married Number of Years Married: 8 What types of issues is patient dealing with in the relationship?: Patient denied. Are you sexually active?: Yes What is your sexual orientation?: Heterosexual Has your sexual activity been affected by drugs, alcohol, medication, or emotional stress?: Patient denied. Does patient have children?: Yes How many children?: 1 How is patient's relationship with their children?: Patient reported that her son passed a week ago and they had a good relationship.  Childhood History:  By whom was/is the patient raised?: Grandparents Additional childhood history information: Patient reported that she was placed in the custody of her grandparents at 7  months old. Description of patient's relationship with caregiver when they were a child: Good. Patient's description of current relationship with people who raised him/her: Good. How were you disciplined when you got in trouble as a child/adolescent?: Sent to my room. Does patient have siblings?: Yes Number of Siblings: 2 Description of patient's current relationship with siblings: Good. Did patient suffer any verbal/emotional/physical/sexual abuse as a child?: No Did patient suffer from severe childhood neglect?: No Has patient ever been sexually abused/assaulted/raped as an adolescent or adult?: No Was the patient ever a victim of a crime or a disaster?: No Witnessed domestic violence?: No Has patient been affected by domestic violence as an adult?: No  Education:  Highest grade of school patient has completed: [employer]. Currently a student?: No Learning disability?: Yes What learning problems does patient have?: Learning disability.  Employment/Work Situation:   Employment Situation: On disability Why is Patient on Disability: Intellectual disability How Long has Patient Been on Disability: All my life Patient's Job has Been Impacted by Current Illness: No What is the Longest Time Patient has Held a Job?: 1 year Where was the Patient Employed at that Time?: [employer] Has Patient ever Been in the [employer]?: No  Financial Resources:   Surveyor, Quantity resources: [employer], [employer], [employer], Insurance Claims Handler Does patient have a lawyer or guardian?: No  Alcohol/Substance Abuse:   What has been your use of drugs/alcohol within the last 12 months?: None reported. If attempted suicide, did drugs/alcohol play a role in this?: No Alcohol/Substance Abuse Treatment Hx: Denies past history Is patient motivated for change?: Yes Does patient live in an environment that promotes recovery or serves as an obstacle to recovery?: Yes - promotes recovery Describe how  the environment promotes recovery or serves as an obstacle to recovery: Patient reported that  she feels supported by her husband. Are others in the home using alcohol or other substances?: No Are significant others in the home willing to participate in the patient's care?: Yes Describe significant others willing to participate in the patient's care: Patient reported that she feels supported by her husband. Has alcohol/substance abuse ever caused legal problems?: No  Social Support System:   Patient's Community Support System: Good Describe Community Support System: Patient reported that she feels supported by her husband. Type of faith/religion: Christian. How does patient's faith help to cope with current illness?: I pray.  Leisure/Recreation:   Do You Have Hobbies?: Yes Leisure and Hobbies: Reading, movies, bowling and watching football.  Strengths/Needs:   What is the patient's perception of their strengths?: My smile. Patient states they can use these personal strengths during their treatment to contribute to their recovery: Laughter. Patient states these barriers may affect/interfere with their treatment: None reported. Patient states these barriers may affect their return to the community: None reported. Other important information patient would like considered in planning for their treatment: Patient is interested in a therapist or psychiatrist.  Discharge Plan:   Currently receiving community mental health services: No Patient states concerns and preferences for aftercare planning are: Patient is interested in a therapist or psychiatrist. Patient states they will know when they are safe and ready for discharge when: When my medicine starts working. Does patient have access to transportation?: Yes Does patient have financial barriers related to discharge medications?: No Patient description of barriers related to discharge medications: None reported. Plan for no  access to transportation at discharge: CSW to assist with transportation needs. Will patient be returning to same living situation after discharge?: Yes  Summary/Recommendations:   Summary and Recommendations (to be completed by the evaluator): Patient is a 44 year old woman presented to the ED for complaints of right flank pain which later transitioned to patient having suicidal thoughts with intent to Take a bunch of pills. . She carries the psychiatric diagnoses of MDD, GAD substance abuse (THC, cocaine, and past EtOH/IV heroin) and 1 prior suicide attempt via OD and has a past medical history of GERD according to chart. During assessment with this clinical research associate, patient reported I lost my son a week ago. I'm taking it hard right now. When asked of bereavement stressors, patient reported I lost my son a week ago. I'm taking it hard right now. Patient currently resides with her husband of 8 years and described the atmosphere as comfortable. Patient denied relationship stressors. Patient is currently unemployed but receives disability for intellectual disability my whole life. Patient receives Medicare, Medicaid, Disability and food stamps. Patient denied substance use. Patient reported receiving good support from her husband. Patient is currently not followed by a therapist or psychiatrist but would like a referral at discharge. Patient denied SI, HI and AVH. Patients current diagnosis Is MDD (major depressive disorder). Recommendations include: crisis stabilization, therapeutic milieu, encourage group attendance and participation, medication management for mood stabilization and development of a comprehensive mental wellness plan.  Felicia Sutton. 10/25/2024

## 2024-10-25 NOTE — Group Note (Signed)
 [facility] LCSW Group Therapy Note   Group Date: 10/25/2024 Start Time: 1300 End Time: 1400   Type of Therapy/Topic:  Group Therapy:  Emotion Regulation  Participation Level:  Did Not Attend   Mood:  Description of Group:    The purpose of this group is to assist patients in learning to regulate negative emotions and experience positive emotions. Patients will be guided to discuss ways in which they have been vulnerable to their negative emotions. These vulnerabilities will be juxtaposed with experiences of positive emotions or situations, and patients challenged to use positive emotions to combat negative ones. Special emphasis will be placed on coping with negative emotions in conflict situations, and patients will process healthy conflict resolution skills.  Therapeutic Goals: Patient will identify two positive emotions or experiences to reflect on in order to balance out negative emotions:  Patient will label two or more emotions that they find the most difficult to experience:  Patient will be able to demonstrate positive conflict resolution skills through discussion or role plays:   Summary of Patient Progress: Patient declined to attend group.  Therapeutic Modalities:   Cognitive Behavioral Therapy Feelings Identification Dialectical Behavioral Therapy   Sherryle JINNY Margo, LCSW

## 2024-10-25 NOTE — [facility] Suicide Risk Assessment (Signed)
 [facility] INPATIENT:  Family/Significant Other Suicide Prevention Education  Suicide Prevention Education:  Contact Attempts: Medford Sauers, [phone], Husband, has been identified by the patient as the family member/significant other with whom the patient will be residing, and identified as the person(s) who will aid the patient in the event of a mental health crisis.  With written consent from the patient, two attempts were made to provide suicide prevention education, prior to and/or following the patient's discharge.  We were unsuccessful in providing suicide prevention education.  A suicide education pamphlet was given to the patient to share with family/significant other.  Date and time of first attempt: 10/25/24 at 1:30 PM.   Date and time of second attempt: Second attempt is needed.   Felicia Sutton M [name] 10/25/2024, 1:29 PM

## 2024-10-25 NOTE — Progress Notes (Signed)
" °   10/25/24 1100  Psych Admission Type (Psych Patients Only)  Admission Status Voluntary  Psychosocial Assessment  Patient Complaints Depression;Anxiety  Eye Contact Fair  Facial Expression Flat  Affect Depressed  Speech Soft  Interaction Minimal  Motor Activity Slow  Appearance/Hygiene Unremarkable  Behavior Characteristics Cooperative  Mood Sad;Depressed  Thought Process  Coherency WDL  Content WDL  Delusions None reported or observed  Perception WDL  Hallucination None reported or observed  Judgment Limited  Confusion None  Danger to Self  Current suicidal ideation? Denies    "

## 2024-10-25 NOTE — Progress Notes (Signed)
 SPIRITUAL CARE AND COUNSELING CONSULT NOTE   VISIT SUMMARY Chaplain On-Call responded to Spiritual Care Consult Order from Sree Jadapalle, MD. The Order stated suicidal thoughts and patient recently lost her son due to a car accident. Chaplain provided prayer and spiritual and emotional grief support for Felicia Sutton, encouraging her for ways to express her grief, and to recall special memories of son Felicia Sutton.    SPIRITUAL ENCOUNTER                                                                                                                                                                      Type of Visit: Initial Care provided to:: Patient Referral source: Physician (Spiritual Care Consult Order from Sree Jadapalle, MD) Reason for visit: Grief/loss OnCall Visit: Yes   SPIRITUAL FRAMEWORK  Presenting Themes: Significant life change, Coping tools Patient Stress Factors: Loss, Major life changes, Other (Comment) (son Felicia Sutton died in a car accident last week)   GOALS       INTERVENTIONS   Spiritual Care Interventions Made: Prayer, Supported grief process    INTERVENTION OUTCOMES      SPIRITUAL CARE PLAN        If immediate needs arise, please contact [facility] 24 hour on call [phone]   Carlin KATHEE Lay, Chaplain  10/25/2024 10:51 AM

## 2024-10-26 MED ORDER — TRAZODONE HCL 50 MG PO TABS
150.0000 mg | ORAL_TABLET | Freq: Every day | ORAL | Status: AC
  Administered 2024-10-26 – 2024-10-27 (×2): 150 mg via ORAL
  Filled 2024-10-26 (×2): qty 1

## 2024-10-26 NOTE — Plan of Care (Signed)
   Problem: Education: Goal: Emotional status will improve Outcome: Progressing Goal: Mental status will improve Outcome: Progressing

## 2024-10-26 NOTE — Progress Notes (Signed)
 [facility] MD Progress Note  10/26/2024 1:45 PM Felicia Sutton  MRN:  [id number]   Subjective:  Chart reviewed, case discussed in multidisciplinary meeting, patient seen during rounds.   2/5: Patient found resting in her room during rounds. She reports she did not sleep well last night, identifying getting only two hours of sleep. Nursing reported that the unit was loud throughout the night due to a peer. It appears this Sacheen Arrasmith have impacted the patient's sleep. Patient reports her mood is sad and she is having dreams about her son's car crash. She reports he was hit by a drunk driver in [facility]. She denies SI, HI, and AVH.   Past Psychiatric History: see h&P Family History: History reviewed. No pertinent family history. Social History:  Social History   Substance and Sexual Activity  Alcohol Use Not Currently     Social History   Substance and Sexual Activity  Drug Use Not Currently    Social History   Socioeconomic History   Marital status: Single    Spouse name: Not on file   Number of children: Not on file   Years of education: Not on file   Highest education level: Not on file  Occupational History   Not on file  Tobacco Use   Smoking status: Never    Passive exposure: Never   Smokeless tobacco: Never  Vaping Use   Vaping status: Never Used  Substance and Sexual Activity   Alcohol use: Not Currently   Drug use: Not Currently   Sexual activity: Not Currently  Other Topics Concern   Not on file  Social History Narrative   ** Merged History Encounter **       Social Drivers of Health   Tobacco Use: Low Risk (10/25/2024)   Patient History    Smoking Tobacco Use: Never    Smokeless Tobacco Use: Never    Passive Exposure: Never  Financial Resource Strain: Not on file  Food Insecurity: No Food Insecurity (10/25/2024)   Epic    Worried About Programme Researcher, Broadcasting/film/video in the Last Year: Never true    Ran Out of Food in the Last Year: Never true  Transportation Needs: No  Transportation Needs (10/25/2024)   Epic    Lack of Transportation (Medical): No    Lack of Transportation (Non-Medical): No  Physical Activity: Not on file  Stress: Not on file  Social Connections: Unknown (01/25/2022)   [facility]   Social Network    Social Network: Not on file  Depression [phone]): Not on file  Alcohol Screen: Low Risk (10/24/2024)   Alcohol Screen    Last Alcohol Screening Score (AUDIT): 0  Housing: High Risk (10/25/2024)   Epic    Unable to Pay for Housing in the Last Year: Yes    Number of Times Moved in the Last Year: 0    Homeless in the Last Year: No  Utilities: Not At Risk (10/25/2024)   Epic    Threatened with loss of utilities: No  Health Literacy: Not on file   Past Medical History:  Past Medical History:  Diagnosis Date   GERD (gastroesophageal reflux disease) 2017   Major depression 12/2019    Past Surgical History:  Procedure Laterality Date   CHOLECYSTECTOMY  01/2019   ESOPHAGOGASTRODUODENOSCOPY (EGD) WITH PROPOFOL  N/A 02/01/2020   Procedure: ESOPHAGOGASTRODUODENOSCOPY (EGD) WITH PROPOFOL ;  Surgeon: Legrand Victory LITTIE DOUGLAS, MD;  Location: [facility] ENDOSCOPY;  Service: Gastroenterology;  Laterality: N/A;    Current Medications:  Current Facility-Administered Medications  Medication Dose Route Frequency Provider Last Rate Last Admin   acetaminophen  (TYLENOL ) tablet 650 mg  650 mg Oral Q6H PRN Coleman, Carolyn H, NP       alum & mag hydroxide-simeth (MAALOX/MYLANTA) 200-200-20 MG/5ML suspension 30 mL  30 mL Oral Q4H PRN Coleman, Carolyn H, NP       ARIPiprazole  (ABILIFY ) tablet 10 mg  10 mg Oral Daily Coleman, Carolyn H, NP   10 mg at 10/26/24 9170   haloperidol  (HALDOL ) tablet 5 mg  5 mg Oral TID PRN Onuoha, Chinwendu V, NP       And   diphenhydrAMINE  (BENADRYL ) capsule 50 mg  50 mg Oral TID PRN Onuoha, Chinwendu V, NP       haloperidol  (HALDOL ) tablet 5 mg  5 mg Oral TID PRN Mardy Elveria DEL, NP       And   diphenhydrAMINE  (BENADRYL )  capsule 50 mg  50 mg Oral TID PRN Mardy Elveria DEL, NP       haloperidol  lactate (HALDOL ) injection 5 mg  5 mg Intramuscular TID PRN Onuoha, Chinwendu V, NP       And   diphenhydrAMINE  (BENADRYL ) injection 50 mg  50 mg Intramuscular TID PRN Onuoha, Chinwendu V, NP       And   LORazepam  (ATIVAN ) injection 2 mg  2 mg Intramuscular TID PRN Onuoha, Chinwendu V, NP       haloperidol  lactate (HALDOL ) injection 10 mg  10 mg Intramuscular TID PRN Onuoha, Chinwendu V, NP       And   diphenhydrAMINE  (BENADRYL ) injection 50 mg  50 mg Intramuscular TID PRN Onuoha, Chinwendu V, NP       And   LORazepam  (ATIVAN ) injection 2 mg  2 mg Intramuscular TID PRN Onuoha, Chinwendu V, NP       haloperidol  lactate (HALDOL ) injection 5 mg  5 mg Intramuscular TID PRN Mardy Elveria DEL, NP       And   diphenhydrAMINE  (BENADRYL ) injection 50 mg  50 mg Intramuscular TID PRN Mardy Elveria DEL, NP       And   LORazepam  (ATIVAN ) injection 2 mg  2 mg Intramuscular TID PRN Coleman, Carolyn H, NP       haloperidol  lactate (HALDOL ) injection 10 mg  10 mg Intramuscular TID PRN Mardy Elveria DEL, NP       And   diphenhydrAMINE  (BENADRYL ) injection 50 mg  50 mg Intramuscular TID PRN Mardy Elveria DEL, NP       And   LORazepam  (ATIVAN ) injection 2 mg  2 mg Intramuscular TID PRN Coleman, Carolyn H, NP       famotidine  (PEPCID ) tablet 20 mg  20 mg Oral BID Coleman, Carolyn H, NP   20 mg at 10/26/24 9171   FLUoxetine  (PROZAC ) capsule 40 mg  40 mg Oral Daily Coleman, Carolyn H, NP   40 mg at 10/26/24 9170   hydrOXYzine  (ATARAX ) tablet 25 mg  25 mg Oral TID PRN Coleman, Carolyn H, NP   25 mg at 10/25/24 9155   magnesium  hydroxide (MILK OF MAGNESIA) suspension 30 mL  30 mL Oral Daily PRN Coleman, Carolyn H, NP       traZODone  (DESYREL ) tablet 100 mg  100 mg Oral QHS Coleman, Carolyn H, NP   100 mg at 10/25/24 2103    Lab Results: No results found for this or any previous visit (from the past 48 hours).  Blood Alcohol level:   Lab Results  Component  Value Date   [facility] <15 10/24/2024   ETH <10 11/29/2021    Metabolic Disorder Labs: Lab Results  Component Value Date   HGBA1C 5.3 10/07/2024   MPG 105.41 10/07/2024   MPG 108 12/02/2021   No results found for: PROLACTIN Lab Results  Component Value Date   CHOL 147 10/07/2024   TRIG 97 10/07/2024   HDL 44 10/07/2024   CHOLHDL 3.3 10/07/2024   VLDL 19 10/07/2024   LDLCALC 83 10/07/2024   LDLCALC 84 12/02/2021    Physical Findings: AIMS:  , ,  ,  ,    CIWA:    COWS:      Psychiatric Specialty Exam:  Presentation  General Appearance:  Appropriate for Environment; Casual  Eye Contact: Good  Speech: Clear and Coherent; Normal Rate  Speech Volume: Normal    Mood and Affect  Mood: Depressed; Dysphoric  Affect: Appropriate; Congruent   Thought Process  Thought Processes: Coherent; Linear  Orientation:Full (Time, Place and Person)  Thought Content:Logical  Hallucinations:Hallucinations: None  Ideas of Reference:None  Suicidal Thoughts:Suicidal Thoughts: No SI Passive Intent and/or Plan: With Intent  Homicidal Thoughts:Homicidal Thoughts: No   Sensorium  Memory: Immediate Poor; Recent Poor; Remote Poor  Judgment: Poor  Insight: Poor   Executive Functions  Concentration: Fair  Attention Span: Fair  Recall: Poor  Fund of Knowledge: Poor  Language: Fair   Psychomotor Activity  Psychomotor Activity: Psychomotor Activity: Normal  Musculoskeletal: Strength & Muscle Tone: within normal limits Gait & Station: normal Assets  Assets: Housing; Intimacy; Resilience; Social Support; Desire for Improvement    Physical Exam: Physical Exam Vitals and nursing note reviewed.  Constitutional:      Appearance: Normal appearance.  Pulmonary:     Effort: Pulmonary effort is normal.  Neurological:     Mental Status: She is alert and oriented to person, place, and time.  Psychiatric:        Behavior:  Behavior normal.        Thought Content: Thought content normal.    Review of Systems  Respiratory:  Negative for shortness of breath.   Cardiovascular:  Negative for chest pain.  Gastrointestinal:  Negative for diarrhea, nausea and vomiting.  Psychiatric/Behavioral:  Positive for depression and substance abuse. Negative for hallucinations and suicidal ideas. The patient is nervous/anxious.   All other systems reviewed and are negative.  Blood pressure 110/76, pulse 68, temperature 97.8 F (36.6 C), temperature source Oral, resp. rate 18, height 5' 0.25 (1.53 m), weight 98 kg, last menstrual period 10/07/2024, SpO2 100%. Body mass index is 41.85 kg/m.  Diagnosis: Principal Problem:   MDD (major depressive disorder) Active Problems:   MDD (major depressive disorder), severe (HCC)   PLAN: Safety and Monitoring:  -- Voluntary admission to inpatient psychiatric unit for safety, stabilization and treatment  -- Daily contact with patient to assess and evaluate symptoms and progress in treatment  -- Patient's case to be discussed in multi-disciplinary team meeting  -- Observation Level : q15 minute checks  -- Vital signs:  q12 hours  -- Precautions: suicide, elopement, and assault -- Encouraged patient to participate in unit milieu and in scheduled group therapies  2. Psychiatric Treatment:  Scheduled Medications: - Abilify  10 mg daily - Fluoxetine  40 mg daily - Hydroxyzine  25 mg TID PRN for anxiety - Trazodone  150 mg nightly for sleep    -- The risks/benefits/side-effects/alternatives to this medication were discussed in detail with the patient and time was given for questions. The patient consents to  medication trial.  3. Medical Issues Being Addressed:  No medical issues currently.    4. Discharge Planning:   -- Social work and case management to assist with discharge planning and identification of hospital follow-up needs prior to discharge  -- Estimated LOS: 5-7  days  Jamelyn Bovard, NP 10/26/2024, 1:45 PM

## 2024-10-26 NOTE — [facility] Counselor (Signed)
 Suicide prevention education completed via phone with Medford Sauers, [phone], Boyfriend.    According to patient's boyfriend, she doesn't have anywhere to stay. She was trying to find shelter. Boyfriend reported that the patient does have a son that she reported was in a car accident a month ago and was in a coma but she didn't say anything else. Boyfriend reported that the patient tells stories and lies and that's why I'm not sure if he's actually alive or not. Boyfriend reported that the patient previously stated during COVID that her son died from COVID and it wasn't true. Boyfriend reported that it's hard to help her because you don't know when she's being honest. She doesn't want to be in the cold. Boyfriend reported that6 the patient may have a engineer, drilling and a warrant out from probation but that the patient continuously denies. Boyfriend reported that he isn't physically worried about her doing something to someone else but physically to herself. Boyfriend reported that the patient can not reside with him as he is caring for his grandmother and resides with her at this time.

## 2024-10-26 NOTE — Group Note (Signed)
 LCSW Group Therapy Note   Group Date: 10/26/2024 Start Time: 1300 End Time: 1400   Type of Therapy and Topic:  Group Therapy: Challenging Core Beliefs  Participation Level:  Did Not Attend  Description of Group:  Patients were educated about core beliefs and asked to identify one harmful core belief that they have. Patients were asked to explore from where those beliefs originate. Patients were asked to discuss how those beliefs make them feel and the resulting behaviors of those beliefs. They were then be asked if those beliefs are true and, if so, what evidence they have to support them. Lastly, group members were challenged to replace those negative core beliefs with helpful beliefs.   Therapeutic Goals:   1. Patient will identify harmful core beliefs and explore the origins of such beliefs. 2. Patient will identify feelings and behaviors that result from those core beliefs. 3. Patient will discuss whether such beliefs are true. 4.  Patient will replace harmful core beliefs with helpful ones.  Summary of Patient Progress: Patient did not attend.   Therapeutic Modalities: Cognitive Behavioral Therapy; Solution-Focused Therapy   Raygen Linquist M Avary Pitsenbarger, LCSWA 10/26/2024  2:04 PM

## 2024-10-26 NOTE — Group Note (Signed)
 Recreation Therapy Group Note   Group Topic:Coping Skills  Group Date: 10/26/2024 Start Time: 1530 End Time: 1615 Facilitators: Celestia Jeoffrey BRAVO, LRT, CTRS Location: [facility]  Group Description: Painting a Diplomatic Services Operational Officer. Participants will engage in a guided art activity focused on painting a personal peaceful place. This group encourages relaxation, self-expression, and mindfulness through creative exploration. Participants are invited to imagine a location where they feel calm, safe, or content and represent it using paint and other art materials. Emphasis is placed on the process rather than artistic skill, allowing individuals to express emotions, reduce stress, and increase self-awareness in a supportive group setting.  Goal Area(s) Addressed:   Patient will promote relaxation and stress reduction  Patient will enhance emotional expression and self-awareness  Patient will be given support in coping skills and grounding techniques  Patient will be encouraged to use creativity and positive leisure engagement  Patient will foster social interaction and group participation   Affect/Mood: Appropriate   Participation Level: Active and Engaged   Participation Quality: Independent   Behavior: Calm and Cooperative   Speech/Thought Process: Coherent   Insight: Fair   Judgement: Fair    Modes of Intervention: Art   Patient Response to Interventions:  Attentive, Engaged, and Receptive   Education Outcome:  In group clarification offered    Clinical Observations/Individualized Feedback: Kaileia was active in their participation of session activities and group discussion. Pt identified the sky with grass, flowers and the sun as her peaceful place.    Plan: Continue to engage patient in RT group sessions 2-3x/week.   Jeoffrey BRAVO Celestia, LRT, CTRS 10/26/2024 5:03 PM

## 2024-10-26 NOTE — Plan of Care (Signed)
" °  Problem: Education: Goal: Emotional status will improve Outcome: Progressing Goal: Mental status will improve Outcome: Progressing Goal: Verbalization of understanding the information provided will improve Outcome: Progressing   Problem: Activity: Goal: Interest or engagement in activities will improve Outcome: Not Progressing Goal: Sleeping patterns will improve Outcome: Progressing   Problem: Coping: Goal: Ability to verbalize frustrations and anger appropriately will improve Outcome: Progressing Goal: Ability to demonstrate self-control will improve Outcome: Progressing   Problem: Health Behavior/Discharge Planning: Goal: Compliance with treatment plan for underlying cause of condition will improve Outcome: Progressing   "

## 2024-10-26 NOTE — Progress Notes (Signed)
" °   10/26/24 1038  Psych Admission Type (Psych Patients Only)  Admission Status Voluntary  Psychosocial Assessment  Patient Complaints Anxiety;Depression  Eye Contact Fair  Facial Expression Flat  Affect Depressed  Speech Soft  Interaction Minimal  Motor Activity Slow  Appearance/Hygiene Unremarkable  Behavior Characteristics Cooperative  Mood Depressed;Pleasant  Aggressive Behavior  Effect No apparent injury  Thought Process  Coherency WDL  Content WDL  Delusions None reported or observed  Perception WDL  Hallucination None reported or observed  Judgment Poor  Confusion WDL  Danger to Self  Current suicidal ideation? Denies  Agreement Not to Harm Self Yes  Danger to Others  Danger to Others None reported or observed    "

## 2024-10-26 NOTE — Group Note (Signed)
 Recreation Therapy Group Note   Group Topic:Animal Assisted Therapy   Group Date: 10/26/2024 Start Time: 1045 End Time: 1100 Facilitators: Celestia Jeoffrey BRAVO, LRT, CTRS Location: Dayroom  Group Description: AAA. Animal-Assisted Activity provides opportunities for motivational, educational, therapeutic and/or recreational benefits to enhance quality of life. Selinda and Rollo visited the unit to interact with patients.   Goal Areas Addressed:  Reduced anxiety and stress Improved mood Increased social interaction Enhanced communication skills Reduced loneliness and isolation Improved emotional regulation   Affect/Mood: N/A   Participation Level: Did not attend    Clinical Observations/Individualized Feedback: Patient did not attend.  Plan: Continue to engage patient in RT group sessions 2-3x/week.   Jeoffrey BRAVO Celestia, LRT, CTRS 10/26/2024 11:36 AM

## 2024-10-26 NOTE — Group Note (Signed)
 Date:  10/26/2024 Time:  1:30 PM  Group Topic/Focus:  Goals Group:   The focus of this group is to help patients establish daily goals to achieve during treatment and discuss how the patient can incorporate goal setting into their daily lives to aide in recovery.    Participation Level:  Did Not Attend   Camellia HERO Caeley Dohrmann 10/26/2024, 1:30 PM

## 2024-10-26 NOTE — Group Note (Signed)
 Date:  10/26/2024 Time:  8:53 PM  Group Topic/Focus:  Personal Choices and Values:   The focus of this group is to help patients assess and explore the importance of values in their lives, how their values affect their decisions, how they express their values and what opposes their expression.    Participation Level:  Active  Participation Quality:  Appropriate  Affect:  Appropriate  Cognitive:  Appropriate  Insight: Appropriate  Engagement in Group:  Engaged  Modes of Intervention:  Discussion  Additional Comments:    Asta Corbridge L 10/26/2024, 8:53 PM

## 2024-10-26 NOTE — [facility] Suicide Risk Assessment (Signed)
 [facility] INPATIENT:  Family/Significant Other Suicide Prevention Education  Suicide Prevention Education:  Education Completed; [place] Sauers, [phone], Boyfriend has been identified by the patient as the family member/significant other with whom the patient will be residing, and identified as the person(s) who will aid the patient in the event of a mental health crisis (suicidal ideations/suicide attempt).  With written consent from the patient, the family member/significant other has been provided the following suicide prevention education, prior to the and/or following the discharge of the patient.  The suicide prevention education provided includes the following: Suicide risk factors Suicide prevention and interventions National Suicide Hotline telephone number [facility] assessment telephone number [facility] Emergency Assistance 911 [facility] and/or Residential Mobile Crisis Unit telephone number  Request made of family/significant other to: Remove weapons (e.g., guns, rifles, knives), all items previously/currently identified as safety concern.   Remove drugs/medications (over-the-counter, prescriptions, illicit drugs), all items previously/currently identified as a safety concern.  The family member/significant other verbalizes understanding of the suicide prevention education information provided.  The family member/significant other agrees to remove the items of safety concern listed above.  According to patient's boyfriend, she doesn't have anywhere to stay. She was trying to find shelter. Boyfriend reported that the patient does have a son that she reported was in a car accident a month ago and was in a coma but she didn't say anything else. Boyfriend reported that the patient tells stories and lies and that's why I'm not sure if he's actually alive or not. Boyfriend reported that the patient previously stated during COVID that her son died from COVID and  it wasn't true. Boyfriend reported that it's hard to help her because you don't know when she's being honest. She doesn't want to be in the cold. Boyfriend reported that6 the patient may have a engineer, drilling and a warrant out from probation but that the patient continuously denies. Boyfriend reported that he isn't physically worried about her doing something to someone else but physically to herself. Boyfriend reported that the patient can not reside with him as he is caring for his grandmother and resides with her at this time.   Derius Ghosh M Chanda Laperle 10/26/2024, 11:16 AM

## 2024-10-27 NOTE — Group Note (Signed)
 Recreation Therapy Group Note   Group Topic:Stress Management  Group Date: 10/27/2024 Start Time: 1100 End Time: 1130 Facilitators: Celestia Jeoffrey BRAVO, LRT, CTRS Location: Dayroom  Group Description: Meditation. LRT and patients discussed what they know about meditation and mindfulness. LRT played a Deep Breathing Meditation exercise script for patients to follow along to. LRT and patients discussed how meditation and deep breathing can be used as a coping skill post--discharge to help manage symptoms of stress.   Goal Area(s) Addressed: Patient will practice using relaxation technique. Patient will identify a new coping skill.  Patient will follow multistep directions to reduce anxiety and stress.   Affect/Mood: N/A   Participation Level: Did not attend    Clinical Observations/Individualized Feedback: Patient did not attend.  Plan: Continue to engage patient in RT group sessions 2-3x/week.   Jeoffrey BRAVO Celestia, LRT, CTRS 10/27/2024 11:47 AM

## 2024-10-27 NOTE — Progress Notes (Cosign Needed)
 [facility] MD Progress Note  10/27/2024 3:03 PM Felicia Sutton  MRN:  [id number]   Subjective:  Chart reviewed, case discussed in multidisciplinary meeting, patient seen during rounds.   2/6: Patient found in her room on rounds. She reports that she is doing okay. She reports she was thinking of changing her depression medication as she feels it is not working. Discussed expectations of medication and incorporating therapy into regimen. Discussed taking medications consistency for effectiveness as well. We will keep dosing at its current dose for now and she was encouraged to continue follow up with outpatient if she still feels it was ineffective after taking consistently. Objectively, patient appears to be Improving in overall mood and affect. She is working with social work in regards to housing.   2/5: Patient found resting in her room during rounds. She reports she did not sleep well last night, identifying getting only two hours of sleep. Nursing reported that the unit was loud throughout the night due to a peer. It appears this Toddrick Sanna have impacted the patient's sleep. Patient reports her mood is sad and she is having dreams about her son's car crash. She reports he was hit by a drunk driver in [facility]. She denies SI, HI, and AVH.   Past Psychiatric History: see h&P Family History: History reviewed. No pertinent family history. Social History:  Social History   Substance and Sexual Activity  Alcohol Use Not Currently     Social History   Substance and Sexual Activity  Drug Use Not Currently    Social History   Socioeconomic History   Marital status: Single    Spouse name: Not on file   Number of children: Not on file   Years of education: Not on file   Highest education level: Not on file  Occupational History   Not on file  Tobacco Use   Smoking status: Never    Passive exposure: Never   Smokeless tobacco: Never  Vaping Use   Vaping status: Never Used  Substance and  Sexual Activity   Alcohol use: Not Currently   Drug use: Not Currently   Sexual activity: Not Currently  Other Topics Concern   Not on file  Social History Narrative   ** Merged History Encounter **       Social Drivers of Health   Tobacco Use: Low Risk (10/25/2024)   Patient History    Smoking Tobacco Use: Never    Smokeless Tobacco Use: Never    Passive Exposure: Never  Financial Resource Strain: Not on file  Food Insecurity: No Food Insecurity (10/25/2024)   Epic    Worried About Programme Researcher, Broadcasting/film/video in the Last Year: Never true    Ran Out of Food in the Last Year: Never true  Transportation Needs: No Transportation Needs (10/25/2024)   Epic    Lack of Transportation (Medical): No    Lack of Transportation (Non-Medical): No  Physical Activity: Not on file  Stress: Not on file  Social Connections: Unknown (01/25/2022)   Received from [facility]   Social Network    Social Network: Not on file  Depression [phone]): Not on file  Alcohol Screen: Low Risk (10/24/2024)   Alcohol Screen    Last Alcohol Screening Score (AUDIT): 0  Housing: High Risk (10/25/2024)   Epic    Unable to Pay for Housing in the Last Year: Yes    Number of Times Moved in the Last Year: 0    Homeless in the Last  Year: No  Utilities: Not At Risk (10/25/2024)   Epic    Threatened with loss of utilities: No  Health Literacy: Not on file   Past Medical History:  Past Medical History:  Diagnosis Date   GERD (gastroesophageal reflux disease) 2017   Major depression 12/2019    Past Surgical History:  Procedure Laterality Date   CHOLECYSTECTOMY  01/2019   ESOPHAGOGASTRODUODENOSCOPY (EGD) WITH PROPOFOL  N/A 02/01/2020   Procedure: ESOPHAGOGASTRODUODENOSCOPY (EGD) WITH PROPOFOL ;  Surgeon: Legrand Victory LITTIE DOUGLAS, MD;  Location: [facility];  Service: Gastroenterology;  Laterality: N/A;    Current Medications: Current Facility-Administered Medications  Medication Dose Route Frequency Provider Last Rate Last Admin    acetaminophen  (TYLENOL ) tablet 650 mg  650 mg Oral Q6H PRN Coleman, Carolyn H, NP       alum & mag hydroxide-simeth (MAALOX/MYLANTA) 200-200-20 MG/5ML suspension 30 mL  30 mL Oral Q4H PRN Coleman, Carolyn H, NP       ARIPiprazole  (ABILIFY ) tablet 10 mg  10 mg Oral Daily Coleman, Carolyn H, NP   10 mg at 10/27/24 9146   haloperidol  (HALDOL ) tablet 5 mg  5 mg Oral TID PRN Onuoha, Chinwendu V, NP       And   diphenhydrAMINE  (BENADRYL ) capsule 50 mg  50 mg Oral TID PRN Onuoha, Chinwendu V, NP       haloperidol  (HALDOL ) tablet 5 mg  5 mg Oral TID PRN Mardy Elveria DEL, NP       And   diphenhydrAMINE  (BENADRYL ) capsule 50 mg  50 mg Oral TID PRN Mardy Elveria DEL, NP       haloperidol  lactate (HALDOL ) injection 5 mg  5 mg Intramuscular TID PRN Onuoha, Chinwendu V, NP       And   diphenhydrAMINE  (BENADRYL ) injection 50 mg  50 mg Intramuscular TID PRN Onuoha, Chinwendu V, NP       And   LORazepam  (ATIVAN ) injection 2 mg  2 mg Intramuscular TID PRN Onuoha, Chinwendu V, NP       haloperidol  lactate (HALDOL ) injection 10 mg  10 mg Intramuscular TID PRN Onuoha, Chinwendu V, NP       And   diphenhydrAMINE  (BENADRYL ) injection 50 mg  50 mg Intramuscular TID PRN Onuoha, Chinwendu V, NP       And   LORazepam  (ATIVAN ) injection 2 mg  2 mg Intramuscular TID PRN Onuoha, Chinwendu V, NP       haloperidol  lactate (HALDOL ) injection 5 mg  5 mg Intramuscular TID PRN Mardy Elveria DEL, NP       And   diphenhydrAMINE  (BENADRYL ) injection 50 mg  50 mg Intramuscular TID PRN Mardy Elveria DEL, NP       And   LORazepam  (ATIVAN ) injection 2 mg  2 mg Intramuscular TID PRN Coleman, Carolyn H, NP       haloperidol  lactate (HALDOL ) injection 10 mg  10 mg Intramuscular TID PRN Mardy Elveria DEL, NP       And   diphenhydrAMINE  (BENADRYL ) injection 50 mg  50 mg Intramuscular TID PRN Mardy Elveria DEL, NP       And   LORazepam  (ATIVAN ) injection 2 mg  2 mg Intramuscular TID PRN Coleman, Carolyn H, NP       famotidine   (PEPCID ) tablet 20 mg  20 mg Oral BID Coleman, Carolyn H, NP   20 mg at 10/27/24 9145   FLUoxetine  (PROZAC ) capsule 40 mg  40 mg Oral Daily Coleman, Carolyn H, NP   40 mg  at 10/27/24 0854   hydrOXYzine  (ATARAX ) tablet 25 mg  25 mg Oral TID PRN Coleman, Carolyn H, NP   25 mg at 10/27/24 1200   magnesium  hydroxide (MILK OF MAGNESIA) suspension 30 mL  30 mL Oral Daily PRN Coleman, Carolyn H, NP       traZODone  (DESYREL ) tablet 150 mg  150 mg Oral QHS Nora Rooke, NP   150 mg at 10/26/24 2101    Lab Results: No results found for this or any previous visit (from the past 48 hours).  Blood Alcohol level:  Lab Results  Component Value Date   [facility] <15 10/24/2024   ETH <10 11/29/2021    Metabolic Disorder Labs: Lab Results  Component Value Date   HGBA1C 5.3 10/07/2024   MPG 105.41 10/07/2024   MPG 108 12/02/2021   No results found for: PROLACTIN Lab Results  Component Value Date   CHOL 147 10/07/2024   TRIG 97 10/07/2024   HDL 44 10/07/2024   CHOLHDL 3.3 10/07/2024   VLDL 19 10/07/2024   LDLCALC 83 10/07/2024   LDLCALC 84 12/02/2021    Physical Findings: AIMS:  , ,  ,  ,    CIWA:    COWS:      Psychiatric Specialty Exam:  Presentation  General Appearance:  Appropriate for Environment; Casual  Eye Contact: Good  Speech: Clear and Coherent; Normal Rate  Speech Volume: Normal    Mood and Affect  Mood: Euthymic  Affect: Appropriate   Thought Process  Thought Processes: Coherent; Linear  Orientation:Full (Time, Place and Person)  Thought Content:Logical  Hallucinations:Hallucinations: None  Ideas of Reference:None  Suicidal Thoughts:Suicidal Thoughts: No  Homicidal Thoughts:Homicidal Thoughts: No   Sensorium  Memory: Immediate Fair; Recent Fair; Remote Fair  Judgment: Fair  Insight: Fair   Art Therapist  Concentration: Fair  Attention Span: Fair  Recall: [employer] of Knowledge: Fair  Language: Fair   Psychomotor  Activity  Psychomotor Activity: Psychomotor Activity: Normal  Musculoskeletal: Strength & Muscle Tone: within normal limits Gait & Station: normal Assets  Assets: Manufacturing Systems Engineer; Desire for Improvement    Physical Exam: Physical Exam Vitals and nursing note reviewed.  Constitutional:      Appearance: Normal appearance.  Pulmonary:     Effort: Pulmonary effort is normal.  Neurological:     Mental Status: She is alert and oriented to person, place, and time.  Psychiatric:        Behavior: Behavior normal.        Thought Content: Thought content normal.    Review of Systems  Respiratory:  Negative for shortness of breath.   Cardiovascular:  Negative for chest pain.  Gastrointestinal:  Negative for diarrhea, nausea and vomiting.  Psychiatric/Behavioral:  Positive for depression and substance abuse. Negative for hallucinations and suicidal ideas. The patient is nervous/anxious.   All other systems reviewed and are negative.  Blood pressure 111/73, pulse 68, temperature 97.7 F (36.5 C), temperature source Oral, resp. rate 16, height 5' 0.25 (1.53 m), weight 98 kg, last menstrual period 10/07/2024, SpO2 100%. Body mass index is 41.85 kg/m.  Diagnosis: Principal Problem:   MDD (major depressive disorder) Active Problems:   MDD (major depressive disorder), severe (HCC)   PLAN: Safety and Monitoring:  -- Voluntary admission to inpatient psychiatric unit for safety, stabilization and treatment  -- Daily contact with patient to assess and evaluate symptoms and progress in treatment  -- Patient's case to be discussed in multi-disciplinary team meeting  -- Observation Level :  q15 minute checks  -- Vital signs:  q12 hours  -- Precautions: suicide, elopement, and assault -- Encouraged patient to participate in unit milieu and in scheduled group therapies  2. Psychiatric Treatment:  Scheduled Medications: - Abilify  10 mg daily - Fluoxetine  40 mg daily - Hydroxyzine  25  mg TID PRN for anxiety - Trazodone  150 mg nightly for sleep    -- The risks/benefits/side-effects/alternatives to this medication were discussed in detail with the patient and time was given for questions. The patient consents to medication trial.  3. Medical Issues Being Addressed:  No medical issues currently.    4. Discharge Planning:   -- Social work and case management to assist with discharge planning and identification of hospital follow-up needs prior to discharge  -- Estimated LOS: 5-7 days  Satia Winger, NP 10/27/2024, 3:03 PM

## 2024-10-27 NOTE — Progress Notes (Signed)
" °   10/27/24 0940  Psych Admission Type (Psych Patients Only)  Admission Status Voluntary  Psychosocial Assessment  Patient Complaints Depression;Anxiety  Eye Contact Brief  Facial Expression Flat  Affect Depressed  Speech Soft;Slow  Interaction No initiation;Minimal  Motor Activity Slow  Appearance/Hygiene Unremarkable  Behavior Characteristics Cooperative;Appropriate to situation;Calm  Mood Depressed;Pleasant  Thought Process  Coherency WDL  Content WDL  Delusions None reported or observed  Perception WDL  Hallucination None reported or observed  Judgment Impaired  Confusion WDL  Danger to Self  Current suicidal ideation? Denies  Agreement Not to Harm Self Yes  Description of Agreement verbal    "

## 2024-10-27 NOTE — Group Note (Addendum)
 [facility] LCSW Group Therapy Note   Group Date: 10/27/2024 Start Time: 14:15 End Time: 15:10   Type of Therapy/Topic:  Group Therapy:  Emotion Regulation  Participation Level:  Did Not Attend   Mood:  Description of Group:    The purpose of this group is to assist patients in learning to regulate negative emotions and experience positive emotions. Patients will be guided to discuss ways in which they have been vulnerable to their negative emotions. These vulnerabilities will be juxtaposed with experiences of positive emotions or situations, and patients challenged to use positive emotions to combat negative ones. Special emphasis will be placed on coping with negative emotions in conflict situations, and patients will process healthy conflict resolution skills.  Therapeutic Goals: Patient will identify two positive emotions or experiences to reflect on in order to balance out negative emotions:  Patient will label two or more emotions that they find the most difficult to experience:  Patient will be able to demonstrate positive conflict resolution skills through discussion or role plays:   Summary of Patient Progress:   Patient did not attend.     Therapeutic Modalities:   Cognitive Behavioral Therapy Feelings Identification Dialectical Behavioral Therapy   Alveta CHRISTELLA Kerns, LCSW

## 2024-10-27 NOTE — Progress Notes (Signed)
" °   10/26/24 2300  Psych Admission Type (Psych Patients Only)  Admission Status Voluntary  Psychosocial Assessment  Patient Complaints None  Eye Contact Fair  Facial Expression Flat  Affect Depressed;Anxious  Speech Soft  Interaction Minimal  Motor Activity Slow  Appearance/Hygiene Unremarkable  Behavior Characteristics Appropriate to situation;Cooperative  Mood Depressed;Pleasant  Aggressive Behavior  Effect No apparent injury  Thought Process  Coherency WDL  Content WDL  Delusions None reported or observed  Perception WDL  Hallucination None reported or observed  Judgment Limited  Confusion WDL  Danger to Self  Current suicidal ideation? Denies  Agreement Not to Harm Self Yes  Description of Agreement Verbal  Danger to Others  Danger to Others None reported or observed    "

## 2024-10-27 NOTE — Plan of Care (Signed)
   Problem: Education: Goal: Knowledge of Felicia Sutton General Education information/materials will improve Outcome: Progressing Goal: Emotional status will improve Outcome: Progressing Goal: Mental status will improve Outcome: Progressing Goal: Verbalization of understanding the information provided will improve Outcome: Progressing   Problem: Activity: Goal: Interest or engagement in activities will improve Outcome: Progressing Goal: Sleeping patterns will improve Outcome: Progressing   Problem: Coping: Goal: Ability to verbalize frustrations and anger appropriately will improve Outcome: Progressing Goal: Ability to demonstrate self-control will improve Outcome: Progressing

## 2024-10-27 NOTE — Group Note (Signed)
 Recreation Therapy Group Note   Group Topic:Leisure Education  Group Date: 10/27/2024 Start Time: 1530 End Time: 1620 Facilitators: Celestia Jeoffrey FORBES ARTICE, CTRS Location: Craft Room  Group Description: Leisure. Patients were given the option to choose from journaling, coloring, drawing, making origami, playing with playdoh, listening to music or singing karaoke. LRT and pts discussed the meaning of leisure, the importance of participating in leisure during their free time/when they're outside of the hospital, as well as how our leisure interests can also serve as coping skills.   Goal Area(s) Addressed:  Patient will identify a current leisure interest.  Patient will learn the definition of leisure. Patient will practice making a positive decision. Patient will have the opportunity to try a new leisure activity. Patient will communicate with peers and LRT.    Affect/Mood: N/A   Participation Level: Did not attend    Clinical Observations/Individualized Feedback: Patient did not attend.  Plan: Continue to engage patient in RT group sessions 2-3x/week.   Jeoffrey FORBES Celestia, LRT, CTRS 10/27/2024 5:03 PM

## 2024-10-27 NOTE — Plan of Care (Signed)

## 2024-10-27 NOTE — Progress Notes (Signed)
 Pt up to RN station stating is having some anxiety and would like some medicine. Pt given PRN med at this time.

## 2024-10-27 NOTE — Progress Notes (Signed)
 Pt came up to the nurses station complaining of racing thoughts. Pt was given PRN medication, check MAR

## 2024-10-27 NOTE — Group Note (Signed)
 Date:  10/27/2024 Time:  8:56 PM  Group Topic/Focus:  Identifying Needs:   The focus of this group is to help patients identify their personal needs that have been historically problematic and identify healthy behaviors to address their needs. Making Healthy Choices:   The focus of this group is to help patients identify negative/unhealthy choices they were using prior to admission and identify positive/healthier coping strategies to replace them upon discharge. Managing Feelings:   The focus of this group is to identify what feelings patients have difficulty handling and develop a plan to handle them in a healthier way upon discharge.    Participation Level:  Active  Participation Quality:  Appropriate  Affect:  Appropriate  Cognitive:  Appropriate  Insight: Appropriate  Engagement in Group:  Engaged  Modes of Intervention:  Discussion  Additional Comments:    [place] Kuang L 10/27/2024, 8:56 PM

## 2024-10-27 NOTE — [facility] Group Notes (Signed)
 Spirituality Group   Description: Participant directed exploration of values, beliefs and meaning   **Focus on Gratitude: Invite reflection on sources of gratitude (external/internal); goal to invite internal gratitude to foster 1) reconnection with life-giving activities 2) self-compassion.   Following a brief framework of chaplains role and ground rules of group behavior, participants are invited to share concerns or questions that engage spiritual life. Emphasis placed on common themes and shared experiences and ways to make meaning and clarify living into ones values.   Theory/Process/Goal: Utilize the theoretical framework of group therapy established by Celena Kite, Relational Cultural Theory and Rogerian approaches to facilitate relational empathy and use of the here and now to foster reflection, self-awareness, and sharing.   Observations: Did not attend  Lekeya Rollings L. Delores HERO.Div
